# Patient Record
Sex: Female | Born: 1970 | Race: White | Hispanic: No | Marital: Married | Smoking: Former smoker
Health system: Southern US, Community
[De-identification: ages and names within clinical notes are randomized; demographics above are authoritative.]

## PROBLEM LIST (undated history)

## (undated) DIAGNOSIS — I10 Essential (primary) hypertension: Secondary | ICD-10-CM

## (undated) DIAGNOSIS — D649 Anemia, unspecified: Secondary | ICD-10-CM

## (undated) DIAGNOSIS — K759 Inflammatory liver disease, unspecified: Secondary | ICD-10-CM

## (undated) DIAGNOSIS — D509 Iron deficiency anemia, unspecified: Secondary | ICD-10-CM

## (undated) DIAGNOSIS — B019 Varicella without complication: Secondary | ICD-10-CM

## (undated) DIAGNOSIS — K219 Gastro-esophageal reflux disease without esophagitis: Secondary | ICD-10-CM

## (undated) DIAGNOSIS — F329 Major depressive disorder, single episode, unspecified: Secondary | ICD-10-CM

## (undated) DIAGNOSIS — G2581 Restless legs syndrome: Secondary | ICD-10-CM

## (undated) DIAGNOSIS — F32A Depression, unspecified: Secondary | ICD-10-CM

## (undated) HISTORY — DX: Iron deficiency anemia, unspecified: D50.9

## (undated) HISTORY — DX: Anemia, unspecified: D64.9

## (undated) HISTORY — DX: Major depressive disorder, single episode, unspecified: F32.9

## (undated) HISTORY — DX: Depression, unspecified: F32.A

## (undated) HISTORY — DX: Varicella without complication: B01.9

## (undated) HISTORY — PX: HERNIA REPAIR: SHX51

## (undated) HISTORY — DX: Essential (primary) hypertension: I10

## (undated) HISTORY — DX: Restless legs syndrome: G25.81

## (undated) HISTORY — DX: Inflammatory liver disease, unspecified: K75.9

---

## 2001-04-21 HISTORY — PX: CHOLECYSTECTOMY: SHX55

## 2018-04-29 ENCOUNTER — Telehealth: Payer: Self-pay

## 2018-04-29 NOTE — Telephone Encounter (Signed)
Copied from [place] [id number]. Topic: Appointment Scheduling - Scheduling Inquiry for Clinic >> Apr 29, 2018  1:36 PM Conception Chancy, NT wrote: Reason for CRM: patient is calling and states that her fiance Denice Paradise sees Dr. [name] and discussed with him about taking on this patient as a new patient. Please advise.

## 2018-04-30 NOTE — Telephone Encounter (Signed)
msg sent to Dr. [name]

## 2018-05-14 NOTE — Telephone Encounter (Signed)
Pts husband checking status on below.

## 2018-05-14 NOTE — Telephone Encounter (Signed)
Called patient to let him know that I apologize for delay and would call her on Monday to schedule appt.

## 2018-05-17 NOTE — Telephone Encounter (Signed)
Called patient to schedule new pt appointment, she stated she did not want to wait until April or May to be seen. She was agreeable to be seen. Placed pt on hold to find new pt appt slot and pt hung up.

## 2018-05-25 ENCOUNTER — Encounter: Payer: Self-pay | Admitting: Family Medicine

## 2018-05-25 ENCOUNTER — Ambulatory Visit (INDEPENDENT_AMBULATORY_CARE_PROVIDER_SITE_OTHER): Payer: PRIVATE HEALTH INSURANCE | Admitting: Family Medicine

## 2018-05-25 VITALS — BP 118/82 | HR 93 | Temp 98.1°F | Resp 18 | Ht 64.5 in | Wt 198.8 lb

## 2018-05-25 DIAGNOSIS — G2581 Restless legs syndrome: Secondary | ICD-10-CM

## 2018-05-25 DIAGNOSIS — Z23 Encounter for immunization: Secondary | ICD-10-CM | POA: Diagnosis not present

## 2018-05-25 DIAGNOSIS — Z13220 Encounter for screening for lipoid disorders: Secondary | ICD-10-CM

## 2018-05-25 DIAGNOSIS — G8929 Other chronic pain: Secondary | ICD-10-CM

## 2018-05-25 DIAGNOSIS — R5383 Other fatigue: Secondary | ICD-10-CM | POA: Diagnosis not present

## 2018-05-25 DIAGNOSIS — D649 Anemia, unspecified: Secondary | ICD-10-CM

## 2018-05-25 DIAGNOSIS — M545 Low back pain: Secondary | ICD-10-CM

## 2018-05-25 LAB — B12 AND FOLATE PANEL
Folate: 5.5 ng/mL — ABNORMAL LOW (ref >5.9)
Vitamin B-12: 514 pg/mL (ref 211–911)

## 2018-05-25 LAB — COMPREHENSIVE METABOLIC PANEL
ALT: 10 U/L (ref 0–35)
AST: 11 U/L (ref 0–37)
Albumin: 4.4 g/dL (ref 3.5–5.2)
Alkaline Phosphatase: 43 U/L (ref 39–117)
BUN: 11 mg/dL (ref 6–23)
CO2: 28 mEq/L (ref 19–32)
Calcium: 8.9 mg/dL (ref 8.4–10.5)
Chloride: 103 mEq/L (ref 96–112)
Creatinine, Ser: 0.47 mg/dL (ref 0.40–1.20)
GFR: 141.48 mL/min (ref >60.00)
Glucose, Bld: 96 mg/dL (ref 70–99)
Potassium: 4.1 mEq/L (ref 3.5–5.1)
Sodium: 138 mEq/L (ref 135–145)
Total Bilirubin: 0.3 mg/dL (ref 0.2–1.2)
Total Protein: 6.9 g/dL (ref 6.0–8.3)

## 2018-05-25 LAB — CBC
HCT: 31.1 % — ABNORMAL LOW (ref 36.0–46.0)
Hemoglobin: 9.2 g/dL — ABNORMAL LOW (ref 12.0–15.0)
MCHC: 29.1 g/dL — ABNORMAL LOW (ref 30.0–36.0)
MCV: 76.3 fl — AB (ref 78.0–100.0)
Platelets: 284 10*3/uL (ref 150.0–400.0)
RBC: 4.08 Mil/uL (ref 3.87–5.11)
RDW: 18.2 % — ABNORMAL HIGH (ref 11.5–15.5)
WBC: 4.6 10*3/uL (ref 4.0–10.5)

## 2018-05-25 LAB — LIPID PANEL
CHOLESTEROL: 177 mg/dL (ref 0–200)
HDL: 50.9 mg/dL (ref >39.00)
LDL CALC: 89 mg/dL (ref 0–99)
NonHDL: 125.95
Total CHOL/HDL Ratio: 3
Triglycerides: 186 mg/dL — ABNORMAL HIGH (ref 0.0–149.0)
VLDL: 37.2 mg/dL (ref 0.0–40.0)

## 2018-05-25 LAB — VITAMIN D 25 HYDROXY (VIT D DEFICIENCY, FRACTURES): VITD: 27.66 ng/mL — ABNORMAL LOW (ref 30.00–100.00)

## 2018-05-25 MED ORDER — PRAMIPEXOLE DIHYDROCHLORIDE 0.125 MG PO TABS: ORAL_TABLET | ORAL | 1 refills | Status: DC

## 2018-05-25 NOTE — Patient Instructions (Addendum)
Take aleve 220 mg in AM and PM for back pain  Can use tylenol in between during day if needed  No Ibuprofen or Advil with aleve -- too much of similar drug  Max tylenol in 24 hours in 4000 mg (8 tablets of the 500mg  extra strength tylenol)   General Headache Without Cause A headache is pain or discomfort that is felt around the head or neck area. There are many causes and types of headaches. In some cases, the cause may not be found. Follow these instructions at home: Watch your condition for any changes. Let your doctor know about them. Take these steps to help with your condition: Managing pain      Take over-the-counter and prescription medicines only as told by your doctor.  Lie down in a dark, quiet room when you have a headache.  If told, put ice on your head and neck area: ? Put ice in a plastic bag. ? Place a towel between your skin and the bag. ? Leave the ice on for 20 minutes, 2-3 times per day.  If told, put heat on the affected area. Use the heat source that your doctor recommends, such as a moist heat pack or a heating pad. ? Place a towel between your skin and the heat source. ? Leave the heat on for 20-30 minutes. ? Remove the heat if your skin turns bright red. This is very important if you are unable to feel pain, heat, or cold. You may have a greater risk of getting burned.  Keep lights dim if bright lights bother you or make your headaches worse. Eating and drinking  Eat meals on a regular schedule.  If you drink alcohol: ? Limit how much you use to:  0-1 drink a day for women.  0-2 drinks a day for men. ? Be aware of how much alcohol is in your drink. In the [place] one drink equals one 12 oz bottle of beer (355 mL), one 5 oz glass of wine (148 mL), or one 1 oz glass of hard liquor (44 mL).  Stop drinking caffeine, or reduce how much caffeine you drink. General instructions   Keep a journal to find out if certain things bring on headaches. For  example, write down: ? What you eat and drink. ? How much sleep you get. ? Any change to your diet or medicines.  Get a massage or try other ways to relax.  Limit stress.  Sit up straight. Do not tighten (tense) your muscles.  Do not use any products that contain nicotine or tobacco. This includes cigarettes, e-cigarettes, and chewing tobacco. If you need help quitting, ask your doctor.  Exercise regularly as told by your doctor.  Get enough sleep. This often means 7-9 hours of sleep each night.  Keep all follow-up visits as told by your doctor. This is important. Contact a doctor if:  Your symptoms are not helped by medicine.  You have a headache that feels different than the other headaches.  You feel sick to your stomach (nauseous) or you throw up (vomit).  You have a fever. Get help right away if:  Your headache gets very bad quickly.  Your headache gets worse after a lot of physical activity.  You keep throwing up.  You have a stiff neck.  You have trouble seeing.  You have trouble speaking.  You have pain in the eye or ear.  Your muscles are weak or you lose muscle control.  You lose  your balance or have trouble walking.  You feel like you will pass out (faint) or you pass out.  You are mixed up (confused).  You have a seizure. Summary  A headache is pain or discomfort that is felt around the head or neck area.  There are many causes and types of headaches. In some cases, the cause may not be found.  Keep a journal to help find out what causes your headaches. Watch your condition for any changes. Let your doctor know about them.  Contact a doctor if you have a headache that is different from usual, or if your headache is not helped by medicine.  Get help right away if your headache gets very bad, you throw up, you have trouble seeing, you lose your balance, or you have a seizure. This information is not intended to replace advice given to you by  your health care provider. Make sure you discuss any questions you have with your health care provider. Document Released: 01/15/2008 Document Revised: 10/26/2017 Document Reviewed: 10/26/2017 Elsevier Interactive Patient Education  2019 [place].  Back Exercises If you have pain in your back, do these exercises 2-3 times each day or as told by your doctor. When the pain goes away, do the exercises once each day, but repeat the steps more times for each exercise (do more repetitions). If you do not have pain in your back, do these exercises once each day or as told by your doctor. Exercises Single Knee to Chest Do these steps 3-5 times in a row for each leg: 1. Lie on your back on a firm bed or the floor with your legs stretched out. 2. Bring one knee to your chest. 3. Hold your knee to your chest by grabbing your knee or thigh. 4. Pull on your knee until you feel a gentle stretch in your lower back. 5. Keep doing the stretch for 10-30 seconds. 6. Slowly let go of your leg and straighten it. Pelvic Tilt Do these steps 5-10 times in a row: 1. Lie on your back on a firm bed or the floor with your legs stretched out. 2. Bend your knees so they point up to the ceiling. Your feet should be flat on the floor. 3. Tighten your lower belly (abdomen) muscles to press your lower back against the floor. This will make your tailbone point up to the ceiling instead of pointing down to your feet or the floor. 4. Stay in this position for 5-10 seconds while you gently tighten your muscles and breathe evenly. Cat-Cow Do these steps until your lower back bends more easily: 1. Get on your hands and knees on a firm surface. Keep your hands under your shoulders, and keep your knees under your hips. You may put padding under your knees. 2. Let your head hang down, and make your tailbone point down to the floor so your lower back is round like the back of a cat. 3. Stay in this position for 5  seconds. 4. Slowly lift your head and make your tailbone point up to the ceiling so your back hangs low (sags) like the back of a cow. 5. Stay in this position for 5 seconds.  Press-Ups Do these steps 5-10 times in a row: 1. Lie on your belly (face-down) on the floor. 2. Place your hands near your head, about shoulder-width apart. 3. While you keep your back relaxed and keep your hips on the floor, slowly straighten your arms to raise the top  half of your body and lift your shoulders. Do not use your back muscles. To make yourself more comfortable, you may change where you place your hands. 4. Stay in this position for 5 seconds. 5. Slowly return to lying flat on the floor.  Bridges Do these steps 10 times in a row: 1. Lie on your back on a firm surface. 2. Bend your knees so they point up to the ceiling. Your feet should be flat on the floor. 3. Tighten your butt muscles and lift your butt off of the floor until your waist is almost as high as your knees. If you do not feel the muscles working in your butt and the back of your thighs, slide your feet 1-2 inches farther away from your butt. 4. Stay in this position for 3-5 seconds. 5. Slowly lower your butt to the floor, and let your butt muscles relax. If this exercise is too easy, try doing it with your arms crossed over your chest. Belly Crunches Do these steps 5-10 times in a row: 1. Lie on your back on a firm bed or the floor with your legs stretched out. 2. Bend your knees so they point up to the ceiling. Your feet should be flat on the floor. 3. Cross your arms over your chest. 4. Tip your chin a little bit toward your chest but do not bend your neck. 5. Tighten your belly muscles and slowly raise your chest just enough to lift your shoulder blades a tiny bit off of the floor. 6. Slowly lower your chest and your head to the floor. Back Lifts Do these steps 5-10 times in a row: 1. Lie on your belly (face-down) with your arms at  your sides, and rest your forehead on the floor. 2. Tighten the muscles in your legs and your butt. 3. Slowly lift your chest off of the floor while you keep your hips on the floor. Keep the back of your head in line with the curve in your back. Look at the floor while you do this. 4. Stay in this position for 3-5 seconds. 5. Slowly lower your chest and your face to the floor. Contact a doctor if:  Your back pain gets a lot worse when you do an exercise.  Your back pain does not lessen 2 hours after you exercise. If you have any of these problems, stop doing the exercises. Do not do them again unless your doctor says it is okay. Get help right away if:  You have sudden, very bad back pain. If this happens, stop doing the exercises. Do not do them again unless your doctor says it is okay. This information is not intended to replace advice given to you by your health care provider. Make sure you discuss any questions you have with your health care provider. Document Released: 05/10/2010 Document Revised: 12/30/2017 Document Reviewed: 06/01/2014 Elsevier Interactive Patient Education  [employer].

## 2018-05-25 NOTE — Progress Notes (Signed)
Subjective:    Patient ID: Brianna [name], female    DOB: 23-Dec-1970, 48 y.o.   MRN: [id number]  HPI   Patient presents to clinic to establish with PCP.  Patient has not seen medical provider in many years, and wants to get reestablished.  Patient's main concern today is restless leg syndrome.  Patient states she has struggled with restless legs for many years.  States that she tried Requip probably over 10 years ago, this is not effective.  States she will jerk and kick in her sleep, and this bothers her significant other.  Patient also complains of some fatigue,, generalized.  States she knows she does not sleep well due to working long hours at her [employer].  States her sleep is often broken because she is more work 16 to 18-hour days, get small amount of sleep and then go back for another long day when having a big order that comes in.  Patient also states she has low back pain almost every day.  Usually the pain will improve with taking Tylenol.  Patient states she has taken up to 12 extra strength Tylenol tablets in 1 day before.  Patient needs a mammogram and Pap smear, but plans to make appointment with a GYN for these things.  Patient Active Problem List   Diagnosis Date Noted  . Restless leg 05/26/2018   Past Medical History:  Diagnosis Date  . Chicken pox   . Depression   . Jaundice due to hepatitis    Social History   Tobacco Use  . Smoking status: Former Research scientist (life sciences)  . Smokeless tobacco: Never Used  Substance Use Topics  . Alcohol use: Yes   Family History  Problem Relation Age of Onset  . Early death Mother   . Heart disease Mother   . Hyperlipidemia Mother   . Hypertension Mother   . Alcohol abuse Father   . Arthritis Father   . COPD Father   . Hyperlipidemia Father   . Hypertension Father   . Kidney disease Father   . Arthritis Maternal Grandmother   . Stroke Maternal Grandmother   . Cancer Maternal Grandfather   . Hyperlipidemia Maternal  Grandfather    Past Surgical History:  Procedure Laterality Date  . CESAREAN SECTION  [phone]  . CHOLECYSTECTOMY  2003    Review of Systems  Constitutional: Negative for chills, and fever. +fatigue HENT: Negative for congestion, ear pain, sinus pain and sore throat.   Eyes: Negative.   Respiratory: Negative for cough, shortness of breath and wheezing.   Cardiovascular: Negative for chest pain, palpitations and leg swelling.  Gastrointestinal: Negative for abdominal pain, diarrhea, nausea and vomiting.  Genitourinary: Negative for dysuria, frequency and urgency.  Musculoskeletal: +low back pain Skin: Negative for color change, pallor and rash.  Neurological: Negative for syncope, light-headedness and headaches. + restless legs while sleeping, x many years Psychiatric/Behavioral: The patient is not nervous/anxious.    Objective:   Physical Exam   Constitutional: She appears well-developed and well-nourished. No distress.  HENT:  Head: Normocephalic and atraumatic.  Eyes: Pupils are equal, round, and reactive to light. EOM are normal. No scleral icterus.  Neck: Normal range of motion. Neck supple. No tracheal deviation present.  Cardiovascular: Normal rate, regular rhythm and normal heart sounds.  Pulmonary/Chest: Effort normal and breath sounds normal. No respiratory distress. She has no wheezes. She has no rales.  Abdominal: Soft. Bowel sounds are normal. There is no tenderness.  Musculoskeletal: Range of motion  of lumbar spine intact.  Patient can bend forward backward, bend side to side, twist side to side without issue. Neurological: She is alert and oriented to person, place, and time. Gait normal.  Grips equal and strong.  Quadricep strength equal and strong.  Dorsi plantar flexion equal and strong.  DTRs normal.  Speech clear, smile symmetrical, can raise eyebrows and puff out cheeks. Skin: Skin is warm and dry. No pallor.  Psychiatric: She has a normal mood and  affect. Her behavior is normal. Thought content normal.   Nursing note and vitals reviewed.   Vitals:   05/25/18 1032  BP: 118/82  Pulse: 93  Resp: 18  Temp: 98.1 F (36.7 C)  SpO2: 98%      Assessment & Plan:   Restless leg syndrome - patient will trial Mirapex to see if this helps with restless leg syndrome.  We will also get lab work to look for any vitamin/electrolyte abnormality.  Fatigue - lab work will further assess for any thyroid issue, electrolyte and balances, vitamin deficiencies or anemias.  Suspect patient's fatigue could be related to her scattered sleep schedule  Chronic bilateral low back pain - patient given handout of exercises to do to help stretching and improve back pain.  Advised on maximums for Tylenol and ibuprofen in 1 day.  Suggest that she try Aleve to 20 mg 1-2 times per day and Tylenol 1-2 times per day as needed if having back pain or other joint pain.  Flu vaccine given in clinic.  Patient will call GYN to get her annual women's health exam including Pap smear and mammogram.  Patient will follow-up here in approximately 4 weeks for recheck on restless leg syndrome after starting Mirapex.

## 2018-05-26 DIAGNOSIS — G2581 Restless legs syndrome: Secondary | ICD-10-CM | POA: Insufficient documentation

## 2018-05-26 LAB — THYROID PANEL WITH TSH
Free Thyroxine Index: 1.6 (ref 1.4–3.8)
T3 Uptake: 33 % (ref 22–35)
T4, Total: 4.9 ug/dL — ABNORMAL LOW (ref 5.1–11.9)
TSH: 2.46 m[IU]/L

## 2018-05-31 ENCOUNTER — Other Ambulatory Visit: Payer: Self-pay | Admitting: Family Medicine

## 2018-05-31 DIAGNOSIS — Z1231 Encounter for screening mammogram for malignant neoplasm of breast: Secondary | ICD-10-CM

## 2018-05-31 NOTE — Addendum Note (Signed)
Addended by: Philis Nettle on: 05/31/2018 12:02 PM   Modules accepted: Orders

## 2018-06-10 ENCOUNTER — Other Ambulatory Visit (INDEPENDENT_AMBULATORY_CARE_PROVIDER_SITE_OTHER): Payer: PRIVATE HEALTH INSURANCE

## 2018-06-10 DIAGNOSIS — D649 Anemia, unspecified: Secondary | ICD-10-CM | POA: Diagnosis not present

## 2018-06-10 DIAGNOSIS — D509 Iron deficiency anemia, unspecified: Secondary | ICD-10-CM

## 2018-06-10 LAB — CBC
HCT: 30.3 % — ABNORMAL LOW (ref 36.0–46.0)
Hemoglobin: 9.1 g/dL — ABNORMAL LOW (ref 12.0–15.0)
MCHC: 29.9 g/dL — AB (ref 30.0–36.0)
MCV: 77.7 fl — ABNORMAL LOW (ref 78.0–100.0)
Platelets: 343 10*3/uL (ref 150.0–400.0)
RBC: 3.9 Mil/uL (ref 3.87–5.11)
RDW: 18.6 % — ABNORMAL HIGH (ref 11.5–15.5)
WBC: 6.5 10*3/uL (ref 4.0–10.5)

## 2018-06-11 LAB — IRON,TIBC AND FERRITIN PANEL
%SAT: 3 % (calc) — ABNORMAL LOW (ref 16–45)
Ferritin: 5 ng/mL — ABNORMAL LOW (ref 16–232)
Iron: 12 ug/dL — ABNORMAL LOW (ref 40–190)
TIBC: 411 ug/dL (ref 250–450)

## 2018-06-11 MED ORDER — FERROUS SULFATE 325 (65 FE) MG PO TBEC: 325.0000 mg | DELAYED_RELEASE_TABLET | Freq: Two times a day (BID) | ORAL | 3 refills | Status: DC

## 2018-06-11 NOTE — Addendum Note (Signed)
Addended by: Philis Nettle on: 06/11/2018 12:38 PM   Modules accepted: Orders

## 2018-06-16 ENCOUNTER — Ambulatory Visit: Payer: PRIVATE HEALTH INSURANCE

## 2018-06-16 ENCOUNTER — Other Ambulatory Visit (HOSPITAL_COMMUNITY)
Admission: RE | Admit: 2018-06-16 | Discharge: 2018-06-16 | Disposition: A | Discharge status: Home / Self Care | Payer: PRIVATE HEALTH INSURANCE | Source: Ambulatory Visit | Attending: Obstetrics and Gynecology | Admitting: Obstetrics and Gynecology

## 2018-06-16 ENCOUNTER — Ambulatory Visit (INDEPENDENT_AMBULATORY_CARE_PROVIDER_SITE_OTHER): Payer: PRIVATE HEALTH INSURANCE | Admitting: Obstetrics and Gynecology

## 2018-06-16 ENCOUNTER — Encounter: Payer: Self-pay | Admitting: Obstetrics and Gynecology

## 2018-06-16 VITALS — BP 150/90 | HR 90 | Ht 64.0 in | Wt 200.0 lb

## 2018-06-16 DIAGNOSIS — N852 Hypertrophy of uterus: Secondary | ICD-10-CM

## 2018-06-16 DIAGNOSIS — N951 Menopausal and female climacteric states: Secondary | ICD-10-CM

## 2018-06-16 DIAGNOSIS — Z124 Encounter for screening for malignant neoplasm of cervix: Secondary | ICD-10-CM

## 2018-06-16 DIAGNOSIS — Z1239 Encounter for other screening for malignant neoplasm of breast: Secondary | ICD-10-CM

## 2018-06-16 DIAGNOSIS — N6323 Unspecified lump in the left breast, lower outer quadrant: Secondary | ICD-10-CM

## 2018-06-16 DIAGNOSIS — Z1231 Encounter for screening mammogram for malignant neoplasm of breast: Secondary | ICD-10-CM

## 2018-06-16 DIAGNOSIS — Z01419 Encounter for gynecological examination (general) (routine) without abnormal findings: Secondary | ICD-10-CM | POA: Diagnosis not present

## 2018-06-16 DIAGNOSIS — Z1211 Encounter for screening for malignant neoplasm of colon: Secondary | ICD-10-CM

## 2018-06-16 DIAGNOSIS — Z0000 Encounter for general adult medical examination without abnormal findings: Secondary | ICD-10-CM

## 2018-06-16 DIAGNOSIS — R232 Flushing: Secondary | ICD-10-CM

## 2018-06-16 DIAGNOSIS — N6324 Unspecified lump in the left breast, lower inner quadrant: Secondary | ICD-10-CM

## 2018-06-16 MED ORDER — PAROXETINE HCL 10 MG PO TABS: 10.0000 mg | ORAL_TABLET | Freq: Every day | ORAL | 11 refills | Status: DC

## 2018-06-16 NOTE — Progress Notes (Signed)
Gynecology Annual Exam  PCP: [name], FNP  Chief Complaint:  Chief Complaint  Patient presents with  . Gynecologic Exam    Some urinary incontinace with cough, laugh, and sneeze, and hot flashes     History of Present Illness: Patient is a 48 y.o. Brianna [name] [name] presents for annual exam. The patient has no complaints today.   Last period was in August of 2019. For two years prior to August she was having heavy and irregular periods and then the bleeding stopped. She has been having hot flashes, irritability and sleep disturbances.   The patient is sexually active. She currently uses none for contraception. She denies dyspareunia.  The patient does perform self breast exams.  There is notable family history of breast or ovarian cancer in her family.  The patient wears seatbelts: yes.   The patient has regular exercise: no.    The patient denies current symptoms of depression.    Review of Systems: Review of Systems  Constitutional: Positive for malaise/fatigue. Negative for chills, fever and weight loss.       + hot flashes + hot / cold intolerance  HENT: Negative for congestion, hearing loss and sinus pain.   Eyes: Negative for blurred vision and double vision.  Respiratory: Negative for cough, sputum production, shortness of breath and wheezing.   Cardiovascular: Negative for chest pain, palpitations, orthopnea and leg swelling.  Gastrointestinal: Negative for abdominal pain, constipation, diarrhea, nausea and vomiting.  Genitourinary: Positive for frequency. Negative for dysuria, flank pain, hematuria and urgency.       + difficulty holding urine  Musculoskeletal: Negative for back pain, falls and joint pain.       + joint swelling  Skin: Negative for itching and rash.  Neurological: Positive for headaches. Negative for dizziness.  Psychiatric/Behavioral: Negative for depression, substance abuse and suicidal ideas. The patient is nervous/anxious.     Past Medical  History:  Past Medical History:  Diagnosis Date  . Anemia   . Chicken pox   . Depression   . Jaundice due to hepatitis   . Restless leg syndrome     Past Surgical History:  Past Surgical History:  Procedure Laterality Date  . CESAREAN SECTION  [phone]  . CHOLECYSTECTOMY  2003    Gynecologic History:  Patient's last menstrual period was 08/Brianna [name]/2019 (approximate). Contraception: none Last Pap: Results were: Patient has not had a pap or a pelvic exam in 15 years.  Last mammogram: Never  Obstetric History: [id number]  Family History:  Family History  Problem Relation Age of Onset  . Early death Mother   . Heart disease Mother   . Hyperlipidemia Mother   . Hypertension Mother   . Alcohol abuse Father   . Arthritis Father   . COPD Father   . Hyperlipidemia Father   . Hypertension Father   . Kidney disease Father   . Arthritis Maternal Grandmother   . Stroke Maternal Grandmother   . Hyperlipidemia Maternal Grandfather   . Breast cancer Paternal Aunt 23    Social History:  Social History   Socioeconomic History  . Marital status: Significant Other    Spouse name: Not on file  . Number of children: Not on file  . Years of education: Not on file  . Highest education level: Not on file  Occupational History  . Not on file  Social Needs  . Financial resource strain: Not on file  . Food insecurity:    Worry: Not on file  Inability: Not on file  . Transportation needs:    Medical: Not on file    Non-medical: Not on file  Tobacco Use  . Smoking status: Former Research scientist (life sciences)  . Smokeless tobacco: Never Used  Substance and Sexual Activity  . Alcohol use: Yes    Comment: occasional   . Drug use: Never  . Sexual activity: Yes    Birth control/protection: Post-menopausal  Lifestyle  . Physical activity:    Days per week: Not on file    Minutes per session: Not on file  . Stress: Not on file  Relationships  . Social connections:    Talks on phone: Not on file     Gets together: Not on file    Attends religious service: Not on file    Active member of club or organization: Not on file    Attends meetings of clubs or organizations: Not on file    Relationship status: Not on file  . Intimate partner violence:    Fear of current or ex partner: Not on file    Emotionally abused: Not on file    Physically abused: Not on file    Forced sexual activity: Not on file  Other Topics Concern  . Not on file  Social History Narrative  . Not on file    Allergies:  Allergies  Allergen Reactions  . Vicodin [Hydrocodone-Acetaminophen] Hives    Medications: Prior to Admission medications   Medication Sig Start Date End Date Taking? Authorizing Provider  Melatonin 10 MG TABS Take by mouth.   Yes [provider]  pramipexole (MIRAPEX) 0.125 MG tablet Take 1 tablet every evening before bed. May increase to 2 tablets every evening after 1 week if one tablet not effective. 05/25/18  Yes Guse, Jacquelynn Cree, FNP  ferrous sulfate 325 (65 FE) MG EC tablet Take 1 tablet (325 mg total) by mouth 2 (two) times daily with a meal. Patient not taking: Reported on 2/Brianna [name]/2020 06/11/18   [name], FNP  PARoxetine (PAXIL) 10 MG tablet Take 1 tablet (10 mg total) by mouth daily. 2/Brianna [name]/20   Homero Fellers, MD    Physical Exam Vitals: Blood pressure (!) 150/90, pulse 90, height 5\' 4"  (1.626 m), weight 200 lb (90.7 kg), last menstrual period 08/Brianna [name]/2019.  General: NAD HEENT: normocephalic, anicteric Thyroid: no enlargement, no palpable nodules Pulmonary: No increased work of breathing, CTAB Cardiovascular: RRR, distal pulses 2+ Breast: Breast symmetrical, no tenderness, Two lumps palpated in the left breast at 5 and 7 o'clock.  No skin or nipple retraction present, no nipple discharge.  No axillary or supraclavicular lymphadenopathy.  Abdomen: NABS, soft, non-tender, non-distended.  Umbilicus without lesions.  No hepatomegaly, splenomegaly or masses palpable. No  evidence of hernia  Genitourinary:  External: Normal external female genitalia.  Normal urethral meatus, normal Bartholin's and Skene's glands.    Vagina: Normal vaginal mucosa, no evidence of prolapse.    Cervix: Grossly normal in appearance, no bleeding  Uterus: Enlarged and bulky about 13cm in size, mobile, normal contour.  No CMT  Adnexa: ovaries non-enlarged, no adnexal masses  Rectal: deferred  Lymphatic: no evidence of inguinal lymphadenopathy Extremities: no edema, erythema, or tenderness Neurologic: Grossly intact Psychiatric: mood appropriate, affect full  Female chaperone present for pelvic and breast  portions of the physical exam    Assessment: 48 y.o. [id number] routine annual exam  Plan: Problem List Items Addressed This Visit    None    Visit Diagnoses    Pap smear  for cervical cancer screening    -  Primary   Relevant Orders   Cytology - PAP   Healthcare maintenance       Screening breast examination       Breast cancer screening by mammogram       Relevant Orders   MM DIAG BREAST TOMO BILATERAL   Visit for pelvic exam       Breast lump on left side at 5 o'clock position       Relevant Orders   MM Digital Diagnostic Bilat   US BREAST LTD UNI RIGHT INC AXILLA   US BREAST LTD UNI LEFT INC AXILLA   MM DIAG BREAST TOMO BILATERAL   Breast lump on left side at 7 o'clock position       Relevant Orders   MM Digital Diagnostic Bilat   US BREAST LTD UNI RIGHT INC AXILLA   US BREAST LTD UNI LEFT INC AXILLA   MM DIAG BREAST TOMO BILATERAL   Vasomotor flushing       Relevant Medications   PARoxetine (PAXIL) 10 MG tablet   Perimenopausal       Relevant Medications   PARoxetine (PAXIL) 10 MG tablet   Bulky or enlarged uterus       Relevant Orders   US PELVIS TRANSVANGINAL NON-OB (TV ONLY)   Colon cancer screening       Relevant Orders   Ambulatory referral to Gastroenterology      1) Mammogram - recommend yearly screening mammograms.   Breast lump felt in  left breast at 5 and 7 o'clock - Diagnostic mammogram and breast US ordered today. Scheduled for 06/18/17 at 1:30pm.   2) STI screening  was offered and declined  3) ASCCP guidelines and rational discussed.  Patient opts for every 3 years screening interval. Pap collected today  4) Contraception - the patient is currently using  none.  She is happy with her current form of contraception and plans to continue.  5) Colonoscopy -- Screening recommended starting at age 32 for average risk individuals, age 28 for individuals deemed at increased risk (including African Americans) and recommended to continue until age 93.  For patient age 31-85 individualized approach is recommended.  Gold standard screening is via colonoscopy, Cologuard screening is an acceptable alternative for patient unwilling or unable to undergo colonoscopy.  "Colorectal cancer screening for average?risk adults: 2018 guideline update from the American Cancer Society"CA: A Cancer Journal for Clinicians: Sep 17, 2016  Referral made to GI for colonoscopy.   6) Routine healthcare maintenance including cholesterol, diabetes screening discussed managed by PCP   7) Elevated BP- will monitor, may need initiation of medication if is persistently elevated  8) Stress incontinence- discussed options for conservative management with tampons, pelvic floor PT and pessary versus surgical management. She is interested in surgical management. No incontinence demonstrated on today's examination, but she had recently emptied her bladder. She will return for further work up and evaluation with a full bladder.   9) Enlarged bulky uterus, no hx of fibroids, will obtain pelvic US.  10) Genital Warts present. She would like these surgically removed, will plan on this at a future visit.   11) Vasomotor symptoms- would like to start paxil for hot flashes. Rx sent.  12)  Return in about 1 week (around 06/23/2018) for GYN visit and [place] (first visit in AM or  PM).   Adrian Prows MD [facility] 2/Brianna [name]/2020 5:53 PM

## 2018-06-16 NOTE — Patient Instructions (Signed)

## 2018-06-17 ENCOUNTER — Inpatient Hospital Stay: Payer: PRIVATE HEALTH INSURANCE | Attending: Oncology | Admitting: Oncology

## 2018-06-17 ENCOUNTER — Encounter: Payer: Self-pay | Admitting: Oncology

## 2018-06-17 ENCOUNTER — Other Ambulatory Visit: Payer: Self-pay

## 2018-06-17 ENCOUNTER — Other Ambulatory Visit: Payer: Self-pay | Admitting: Family Medicine

## 2018-06-17 VITALS — BP 169/108 | HR 70 | Temp 97.6°F | Ht 64.5 in | Wt 200.5 lb

## 2018-06-17 DIAGNOSIS — D509 Iron deficiency anemia, unspecified: Secondary | ICD-10-CM | POA: Insufficient documentation

## 2018-06-17 DIAGNOSIS — Z79899 Other long term (current) drug therapy: Secondary | ICD-10-CM | POA: Diagnosis not present

## 2018-06-17 DIAGNOSIS — Z87891 Personal history of nicotine dependence: Secondary | ICD-10-CM

## 2018-06-17 DIAGNOSIS — Z803 Family history of malignant neoplasm of breast: Secondary | ICD-10-CM | POA: Diagnosis not present

## 2018-06-17 DIAGNOSIS — G2581 Restless legs syndrome: Secondary | ICD-10-CM

## 2018-06-17 HISTORY — DX: Iron deficiency anemia, unspecified: D50.9

## 2018-06-17 NOTE — Progress Notes (Signed)
Patient is here today to establish care for her iron deficiency anemia. Patient stated that she feels tired and fatigued. Patient denied fever, chills, nausea, vomiting, constipation or diarrhea. Patient is set up to have a colonoscopy soon. They will call her with the appointment.

## 2018-06-17 NOTE — Progress Notes (Signed)
[place]  Telephone:(336) [phone] [phone]) [phone]  ID: Brianna [name] OB: Nov 03, 1970  MR#: [id number]  NKN#:[id number]  Patient Care Team: [name], FNP as PCP - General (Family Medicine)  CHIEF COMPLAINT: Iron deficiency anemia.  INTERVAL HISTORY: Patient is a 48 year old female who was noted to have a significantly decreased hemoglobin and iron stores on routine blood work.  She has increased weakness and fatigue, but otherwise feels well.  She has no neurologic complaints.  She denies any recent fevers or illnesses.  She has a good appetite and denies weight loss.  She has no chest pain or shortness of breath.  She denies any nausea, vomiting, constipation, or diarrhea.  She denies any melena or hematochezia.  She has no urinary complaints.  Patient otherwise feels well and offers no further specific complaints today.  REVIEW OF SYSTEMS:   Review of Systems  Constitutional: Positive for malaise/fatigue. Negative for fever and weight loss.  Respiratory: Negative.  Negative for cough, hemoptysis and shortness of breath.   Cardiovascular: Negative.  Negative for chest pain and leg swelling.  Gastrointestinal: Negative.  Negative for abdominal pain, blood in stool and melena.  Genitourinary: Negative.  Negative for hematuria.  Musculoskeletal: Negative.  Negative for back pain.  Skin: Negative.  Negative for rash.  Neurological: Negative.  Negative for focal weakness, weakness and headaches.  Psychiatric/Behavioral: Negative.  The patient is not nervous/anxious.     As per HPI. Otherwise, a complete review of systems is negative.  PAST MEDICAL HISTORY: Past Medical History:  Diagnosis Date  . Anemia   . Chicken pox   . Depression   . Iron deficiency anemia 06/17/2018  . Jaundice due to hepatitis   . Restless leg syndrome     PAST SURGICAL HISTORY: Past Surgical History:  Procedure Laterality Date  . CESAREAN SECTION  [phone]  .  CHOLECYSTECTOMY  2003    FAMILY HISTORY: Family History  Problem Relation Age of Onset  . Early death Mother   . Heart disease Mother   . Hyperlipidemia Mother   . Hypertension Mother   . Alcohol abuse Father   . Arthritis Father   . COPD Father   . Hyperlipidemia Father   . Hypertension Father   . Kidney disease Father   . Arthritis Maternal Grandmother   . Stroke Maternal Grandmother   . Hyperlipidemia Maternal Grandfather   . Breast cancer Paternal Aunt 46    ADVANCED DIRECTIVES (Y/N):  N  HEALTH MAINTENANCE: Social History   Tobacco Use  . Smoking status: Former Research scientist (life sciences)  . Smokeless tobacco: Never Used  Substance Use Topics  . Alcohol use: Yes    Comment: occasional   . Drug use: Never     Colonoscopy:  PAP:  Bone density:  Lipid panel:  Allergies  Allergen Reactions  . Vicodin [Hydrocodone-Acetaminophen] Hives    Current Outpatient Medications  Medication Sig Dispense Refill  . acetaminophen (TYLENOL) 500 MG tablet Take 500 mg by mouth every 6 (six) hours as needed.    . ferrous sulfate 325 (65 FE) MG EC tablet Take 1 tablet (325 mg total) by mouth 2 (two) times daily with a meal. 60 tablet 3  . Melatonin 10 MG TABS Take by mouth.    Marland Kitchen PARoxetine (PAXIL) 10 MG tablet Take 1 tablet (10 mg total) by mouth daily. 30 tablet 11  . pramipexole (MIRAPEX) 0.125 MG tablet Take 1 tablet every evening before bed. May increase to 2 tablets every evening after 1  week if one tablet not effective. 60 tablet 1   No current facility-administered medications for this visit.     OBJECTIVE: Vitals:   06/17/18 1457  BP: (!) 169/108  Pulse: 70  Temp: 97.6 F (36.4 C)     Body mass index is 33.88 kg/m.    ECOG FS:0 - Asymptomatic  General: Well-developed, well-nourished, no acute distress. Eyes: Pink conjunctiva, anicteric sclera. HEENT: Normocephalic, moist mucous membranes, clear oropharnyx. Lungs: Clear to auscultation bilaterally. Heart: Regular rate and  rhythm. No rubs, murmurs, or gallops. Abdomen: Soft, nontender, nondistended. No organomegaly noted, normoactive bowel sounds. Musculoskeletal: No edema, cyanosis, or clubbing. Neuro: Alert, answering all questions appropriately. Cranial nerves grossly intact. Skin: No rashes or petechiae noted. Psych: Normal affect. Lymphatics: No cervical, calvicular, axillary or inguinal LAD.   LAB RESULTS:  Lab Results  Component Value Date   NA 138 05/25/2018   K 4.1 05/25/2018   CL 103 05/25/2018   CO2 28 05/25/2018   GLUCOSE 96 05/25/2018   BUN 11 05/25/2018   CREATININE 0.47 05/25/2018   CALCIUM 8.9 05/25/2018   PROT 6.9 05/25/2018   ALBUMIN 4.4 05/25/2018   AST 11 05/25/2018   ALT 10 05/25/2018   ALKPHOS 43 05/25/2018   BILITOT 0.3 05/25/2018    Lab Results  Component Value Date   WBC 6.5 06/10/2018   HGB 9.1 (L) 06/10/2018   HCT 30.3 (L) 06/10/2018   MCV 77.7 (L) 06/10/2018   PLT 343.0 06/10/2018   Lab Results  Component Value Date   IRON 12 (L) 06/10/2018   TIBC 411 06/10/2018   IRONPCTSAT 3 (L) 06/10/2018   Lab Results  Component Value Date   FERRITIN 5 (L) 06/10/2018     STUDIES: No results found.  ASSESSMENT: Iron deficiency anemia  PLAN:    1. Iron deficiency anemia: Likely from chronic blood loss secondary to previous history of reported heavy menses.  Patient's hemoglobin and iron stores are significantly reduced and she will benefit from IV Feraheme.  She has been instructed to continue taking her oral iron supplementation as prescribed.  If patient's hemoglobin does not improve with iron infusions, will consider a full anemia work-up at a later date.  Also agree with the recommendation of GI referral for consideration of colonoscopy.  Return to clinic in 1 and 2 weeks for 510 mg IV Feraheme.  Patient will then return to clinic in 3 months with repeat laboratory work and further evaluation.  I spent a total of 45 minutes face-to-face with the patient of  which greater than 50% of the visit was spent in counseling and coordination of care as detailed above.   Patient expressed understanding and was in agreement with this plan. She also understands that She can call clinic at any time with any questions, concerns, or complaints.    Lloyd Huger, MD   06/17/2018 3:21 PM

## 2018-06-18 ENCOUNTER — Ambulatory Visit
Admission: RE | Admit: 2018-06-18 | Discharge: 2018-06-18 | Disposition: A | Discharge status: Home / Self Care | Payer: PRIVATE HEALTH INSURANCE | Source: Ambulatory Visit | Attending: Obstetrics and Gynecology | Admitting: Obstetrics and Gynecology

## 2018-06-18 DIAGNOSIS — N6323 Unspecified lump in the left breast, lower outer quadrant: Secondary | ICD-10-CM | POA: Insufficient documentation

## 2018-06-18 DIAGNOSIS — N6324 Unspecified lump in the left breast, lower inner quadrant: Secondary | ICD-10-CM

## 2018-06-18 DIAGNOSIS — Z1231 Encounter for screening mammogram for malignant neoplasm of breast: Secondary | ICD-10-CM | POA: Diagnosis present

## 2018-06-22 ENCOUNTER — Ambulatory Visit: Payer: PRIVATE HEALTH INSURANCE | Admitting: Family Medicine

## 2018-06-22 ENCOUNTER — Inpatient Hospital Stay: Payer: PRIVATE HEALTH INSURANCE | Attending: Oncology

## 2018-06-22 VITALS — BP 119/70 | HR 69 | Resp 18

## 2018-06-22 DIAGNOSIS — Z79899 Other long term (current) drug therapy: Secondary | ICD-10-CM | POA: Insufficient documentation

## 2018-06-22 DIAGNOSIS — Z87891 Personal history of nicotine dependence: Secondary | ICD-10-CM | POA: Diagnosis not present

## 2018-06-22 DIAGNOSIS — D509 Iron deficiency anemia, unspecified: Secondary | ICD-10-CM | POA: Diagnosis present

## 2018-06-22 MED ORDER — SODIUM CHLORIDE 0.9 % IV SOLN
Freq: Once | INTRAVENOUS | Status: AC
  Administered 2018-06-22: 14:00:00 via INTRAVENOUS
  Filled 2018-06-22: qty 250

## 2018-06-22 MED ORDER — SODIUM CHLORIDE 0.9 % IV SOLN
510.0000 mg | Freq: Once | INTRAVENOUS | Status: AC
  Administered 2018-06-22: 510 mg via INTRAVENOUS
  Filled 2018-06-22: qty 17

## 2018-06-22 NOTE — Telephone Encounter (Signed)
Last OV 05/25/2018  Last refilled 05/25/2018 disp 60 with 1 refill should of had an Rx to pick up for this month

## 2018-06-24 ENCOUNTER — Telehealth: Payer: Self-pay

## 2018-06-24 ENCOUNTER — Other Ambulatory Visit: Payer: Self-pay

## 2018-06-24 DIAGNOSIS — Z1211 Encounter for screening for malignant neoplasm of colon: Secondary | ICD-10-CM

## 2018-06-24 NOTE — Telephone Encounter (Signed)
Gastroenterology Pre-Procedure Review  Request Date: 07/06/18 Requesting Physician: Dr. Vicente Males  PATIENT REVIEW QUESTIONS: The patient responded to the following health history questions as indicated:    1. Are you having any GI issues? no 2. Do you have a personal history of Polyps? no 3. Do you have a family history of Colon Cancer or Polyps? no 4. Diabetes Mellitus? no 5. Joint replacements in the past 12 months?no 6. Major health problems in the past 3 months?no 7. Any artificial heart valves, MVP, or defibrillator?no    MEDICATIONS & ALLERGIES:    Patient reports the following regarding taking any anticoagulation/antiplatelet therapy:   Plavix, Coumadin, Eliquis, Xarelto, Lovenox, Pradaxa, Brilinta, or Effient? no Aspirin? no  Patient confirms/reports the following medications:  Current Outpatient Medications  Medication Sig Dispense Refill  . acetaminophen (TYLENOL) 500 MG tablet Take 500 mg by mouth every 6 (six) hours as needed.    . ferrous sulfate 325 (65 FE) MG EC tablet Take 1 tablet (325 mg total) by mouth 2 (two) times daily with a meal. 60 tablet 3  . Melatonin 10 MG TABS Take by mouth.    Marland Kitchen PARoxetine (PAXIL) 10 MG tablet Take 1 tablet (10 mg total) by mouth daily. 30 tablet 11  . pramipexole (MIRAPEX) 0.125 MG tablet Take 1 tablet every evening before bed. May increase to 2 tablets every evening after 1 week if one tablet not effective. 60 tablet 1   No current facility-administered medications for this visit.     Patient confirms/reports the following allergies:  Allergies  Allergen Reactions  . Vicodin [Hydrocodone-Acetaminophen] Hives    No orders of the defined types were placed in this encounter.   AUTHORIZATION INFORMATION Primary Insurance: 1D#: Group #:  Secondary Insurance: 1D#: Group #:  SCHEDULE INFORMATION: Date: 07/06/18 Time: Location:[facility]

## 2018-06-28 LAB — CYTOLOGY - PAP
Diagnosis: NEGATIVE
HPV: NOT DETECTED

## 2018-06-28 NOTE — Progress Notes (Signed)
NIL, released to mychart

## 2018-06-29 ENCOUNTER — Ambulatory Visit (INDEPENDENT_AMBULATORY_CARE_PROVIDER_SITE_OTHER): Payer: PRIVATE HEALTH INSURANCE

## 2018-06-29 ENCOUNTER — Encounter: Payer: Self-pay | Admitting: Obstetrics and Gynecology

## 2018-06-29 ENCOUNTER — Ambulatory Visit (INDEPENDENT_AMBULATORY_CARE_PROVIDER_SITE_OTHER): Payer: PRIVATE HEALTH INSURANCE | Admitting: Obstetrics and Gynecology

## 2018-06-29 VITALS — BP 150/90 | HR 87 | Ht 65.0 in | Wt 200.0 lb

## 2018-06-29 DIAGNOSIS — F419 Anxiety disorder, unspecified: Secondary | ICD-10-CM

## 2018-06-29 DIAGNOSIS — N951 Menopausal and female climacteric states: Secondary | ICD-10-CM

## 2018-06-29 DIAGNOSIS — N852 Hypertrophy of uterus: Secondary | ICD-10-CM

## 2018-06-29 DIAGNOSIS — N8302 Follicular cyst of left ovary: Secondary | ICD-10-CM

## 2018-06-29 DIAGNOSIS — R232 Flushing: Secondary | ICD-10-CM | POA: Diagnosis not present

## 2018-06-29 DIAGNOSIS — D219 Benign neoplasm of connective and other soft tissue, unspecified: Secondary | ICD-10-CM | POA: Insufficient documentation

## 2018-06-29 MED ORDER — ESCITALOPRAM OXALATE 10 MG PO TABS: 10.0000 mg | ORAL_TABLET | Freq: Every day | ORAL | 11 refills | Status: DC

## 2018-06-29 NOTE — Progress Notes (Signed)
Patient ID: Brianna [name], female   DOB: 01-03-1971, 48 y.o.   MRN: [id number]  Reason for Consult: Follow-up (U/s follow up )   Referred by [name], FNP  Subjective:     HPI:  Brianna [name] is a 48 y.o. female . She is following up today for an [place]. Paxil has not been helping with hot flashes. She continues to have severe episodes that are causing anxiety and panic attacks. She feels like she is having emotional outbursts even though she knows that she is not being reasonable. She does not feel like herself.    She has been using a tampon to help with stress incontinence and she has noticed an improvement in her symptoms.  She had her diagnostic mammogram which was normal.   Past Medical History:  Diagnosis Date  . Anemia   . Chicken pox   . Depression   . Iron deficiency anemia 06/17/2018  . Jaundice due to hepatitis   . Restless leg syndrome    Family History  Problem Relation Age of Onset  . Early death Mother   . Heart disease Mother   . Hyperlipidemia Mother   . Hypertension Mother   . Alcohol abuse Father   . Arthritis Father   . COPD Father   . Hyperlipidemia Father   . Hypertension Father   . Kidney disease Father   . Arthritis Maternal Grandmother   . Stroke Maternal Grandmother   . Hyperlipidemia Maternal Grandfather   . Breast cancer Paternal Aunt 89   Past Surgical History:  Procedure Laterality Date  . CESAREAN SECTION  [phone]  . CHOLECYSTECTOMY  2003    Short Social History:  Social History   Tobacco Use  . Smoking status: Former Research scientist (life sciences)  . Smokeless tobacco: Never Used  Substance Use Topics  . Alcohol use: Yes    Comment: occasional     Allergies  Allergen Reactions  . Vicodin [Hydrocodone-Acetaminophen] Hives    Current Outpatient Medications  Medication Sig Dispense Refill  . acetaminophen (TYLENOL) 500 MG tablet Take 500 mg by mouth every 6 (six) hours as needed.    . ferrous sulfate 325 (65 FE) MG EC tablet Take 1 tablet  (325 mg total) by mouth 2 (two) times daily with a meal. 60 tablet 3  . Melatonin 10 MG TABS Take by mouth.    Marland Kitchen PARoxetine (PAXIL) 10 MG tablet Take 1 tablet (10 mg total) by mouth daily. 30 tablet 11  . pramipexole (MIRAPEX) 0.125 MG tablet Take 1 tablet every evening before bed. May increase to 2 tablets every evening after 1 week if one tablet not effective. 60 tablet 1  . escitalopram (LEXAPRO) 10 MG tablet Take 1 tablet (10 mg total) by mouth daily. 30 tablet 11   No current facility-administered medications for this visit.     Review of Systems  Constitutional: Negative for chills, fatigue, fever and unexpected weight change.  HENT: Negative for trouble swallowing.  Eyes: Negative for loss of vision.  Respiratory: Negative for cough, shortness of breath and wheezing.  Cardiovascular: Negative for chest pain, leg swelling, palpitations and syncope.  GI: Negative for abdominal pain, blood in stool, diarrhea, nausea and vomiting.  GU: Negative for difficulty urinating, dysuria, frequency and hematuria.  Musculoskeletal: Negative for back pain, leg pain and joint pain.  Skin: Negative for rash.  Neurological: Negative for dizziness, headaches, light-headedness, numbness and seizures.  Psychiatric: Negative for behavioral problem, confusion, depressed mood and sleep disturbance.  Objective:  Objective   Vitals:   06/29/18 1404  BP: (!) 150/90  Pulse: 87  Weight: 200 lb (90.7 kg)  Height: 5\' 5"  (1.651 m)   Body mass index is 33.28 kg/m.  Physical Exam Vitals signs and nursing note reviewed.  Constitutional:      Appearance: She is well-developed.  HENT:     Head: Normocephalic and atraumatic.  Eyes:     Pupils: Pupils are equal, round, and reactive to light.  Cardiovascular:     Rate and Rhythm: Normal rate and regular rhythm.  Pulmonary:     Effort: Pulmonary effort is normal. No respiratory distress.  Skin:    General: Skin is warm and dry.  Neurological:       Mental Status: She is alert and oriented to person, place, and time.  Psychiatric:        Behavior: Behavior normal.        Thought Content: Thought content normal.        Judgment: Judgment normal.        Assessment/Plan:     48 yo with perimenopause, fibroid uterus, and stress incontinence.   1) Bladder not full, will return for stress incontinence evaluation- is using a tampon currently which is helping.  2) Fibroids vs adenomyosis, no having any bleeding- expectant managment  3) Paxil has not helped with hot flash reduction, has taken for 3 weeks. Reports more symptoms of anxiety would like to try lexapro. Rx sent. After no periods for 1 year can initiate hormone replacement therapy.   More than 25 minutes were spent face to face with the patient in the room with more than 50% of the time spent providing counseling and discussing the plan of management. We discussed.    Adrian Prows MD [facility] 06/29/2018 3:04 PM

## 2018-06-30 ENCOUNTER — Inpatient Hospital Stay: Payer: PRIVATE HEALTH INSURANCE

## 2018-06-30 ENCOUNTER — Other Ambulatory Visit: Payer: Self-pay

## 2018-06-30 VITALS — BP 131/85 | HR 87 | Temp 95.5°F | Resp 18

## 2018-06-30 DIAGNOSIS — D509 Iron deficiency anemia, unspecified: Secondary | ICD-10-CM | POA: Diagnosis not present

## 2018-06-30 MED ORDER — SODIUM CHLORIDE 0.9 % IV SOLN
Freq: Once | INTRAVENOUS | Status: AC
  Administered 2018-06-30: 14:00:00 via INTRAVENOUS
  Filled 2018-06-30: qty 250

## 2018-06-30 MED ORDER — SODIUM CHLORIDE 0.9 % IV SOLN
510.0000 mg | Freq: Once | INTRAVENOUS | Status: AC
  Administered 2018-06-30: 510 mg via INTRAVENOUS
  Filled 2018-06-30: qty 17

## 2018-07-05 ENCOUNTER — Telehealth: Payer: Self-pay | Admitting: Gastroenterology

## 2018-07-05 NOTE — Telephone Encounter (Signed)
Pt left vm to cancel her procedure for 07/06/18 due to what's going on she would like a call to confirm it has been canceled

## 2018-07-05 NOTE — Telephone Encounter (Signed)
Colonoscopy has been canceled per patient request.  She has been contacted to confirm cancellation of 07/06/18.  Thanks [employer]

## 2018-07-06 ENCOUNTER — Encounter: Admission: RE | Payer: Self-pay | Source: Home / Self Care

## 2018-07-06 ENCOUNTER — Ambulatory Visit
Admission: RE | Admit: 2018-07-06 | Payer: PRIVATE HEALTH INSURANCE | Source: Home / Self Care | Admitting: Gastroenterology

## 2018-07-06 SURGERY — COLONOSCOPY WITH PROPOFOL
Anesthesia: General

## 2018-08-05 ENCOUNTER — Ambulatory Visit (INDEPENDENT_AMBULATORY_CARE_PROVIDER_SITE_OTHER): Payer: PRIVATE HEALTH INSURANCE | Admitting: Family Medicine

## 2018-08-05 ENCOUNTER — Other Ambulatory Visit: Payer: Self-pay

## 2018-08-05 DIAGNOSIS — G2581 Restless legs syndrome: Secondary | ICD-10-CM | POA: Diagnosis not present

## 2018-08-05 DIAGNOSIS — D509 Iron deficiency anemia, unspecified: Secondary | ICD-10-CM

## 2018-08-05 MED ORDER — ROPINIROLE HCL 1 MG PO TABS: ORAL_TABLET | ORAL | 2 refills | Status: DC

## 2018-08-05 MED ORDER — MAGNESIUM 200 MG PO TABS: 1.0000 | ORAL_TABLET | Freq: Every day | ORAL | 2 refills | Status: DC

## 2018-08-05 NOTE — Progress Notes (Signed)
Patient ID: Brianna [name], female   DOB: 1971/04/15, 48 y.o.   MRN: [id number]  Virtual Visit via video Note  This visit type was conducted due to national recommendations for restrictions regarding the COVID-19 pandemic (e.g. social distancing).  This format is felt to be most appropriate for this patient at this time.  All issues noted in this document were discussed and addressed.  No physical exam was performed (except for noted visual exam findings with Video Visits).   I connected with Brianna [name] on 08/05/18 at  3:40 PM EDT by a video enabled telemedicine application and verified that I am speaking with the correct person using two identifiers. Location patient: home Location provider: [facility] Persons participating in the virtual visit: patient, provider  I discussed the limitations, risks, security and privacy concerns of performing an evaluation and management service by telephone and the availability of in person appointments. I also discussed with the patient that there may be a patient responsible charge related to this service. The patient expressed understanding and agreed to proceed.  HPI:  Patient and I connected via video to follow-up on restless leg syndrome and to discuss her fatigue which was later discovered to be related to iron deficiency anemia.  Patient trialed Mirapex to see if this would help her restless leg syndrome.  States this medication did not help even at the increased dose.  States she will use melatonin to help get herself to sleep, but then is often awoken various times throughout the night due to restless legs.  Patient is following with hematology regularly for iron infusions.  She has planned infusion coming up in May 2020.  States since getting iron infusion, she is feeling better and her energy level has improved.  She was supposed to have a colonoscopy to further investigate possible causes for her iron deficiency anemia; however colonoscopy was  postponed due to the novel coronavirus pandemic.  ROS:   Constitutional: Negative for chills, fatigue and fever.  HENT: Negative for congestion, ear pain, sinus pain and sore throat.   Eyes: Negative.   Respiratory: Negative for cough, shortness of breath and wheezing.   Cardiovascular: Negative for chest pain, palpitations and leg swelling.  Gastrointestinal: Negative for abdominal pain, diarrhea, nausea and vomiting.  Genitourinary: Negative for dysuria, frequency and urgency.  Musculoskeletal: Negative for arthralgias and myalgias.  Skin: Negative for color change, pallor and rash.  Neurological: Negative for syncope, light-headedness and headaches. +RLS, keeps her awake, does not get restful sleep.  Psychiatric/Behavioral: The patient is not nervous/anxious.     Past Medical History:  Diagnosis Date  . Anemia   . Chicken pox   . Depression   . Iron deficiency anemia 06/17/2018  . Jaundice due to hepatitis   . Restless leg syndrome     Past Surgical History:  Procedure Laterality Date  . CESAREAN SECTION  [phone]  . CHOLECYSTECTOMY  2003    Family History  Problem Relation Age of Onset  . Early death Mother   . Heart disease Mother   . Hyperlipidemia Mother   . Hypertension Mother   . Alcohol abuse Father   . Arthritis Father   . COPD Father   . Hyperlipidemia Father   . Hypertension Father   . Kidney disease Father   . Arthritis Maternal Grandmother   . Stroke Maternal Grandmother   . Hyperlipidemia Maternal Grandfather   . Breast cancer Paternal Aunt 2   Social History   Tobacco Use  . Smoking  status: Former Research scientist (life sciences)  . Smokeless tobacco: Never Used  Substance Use Topics  . Alcohol use: Yes    Comment: occasional     Current Outpatient Medications:  .  acetaminophen (TYLENOL) 500 MG tablet, Take 500 mg by mouth every 6 (six) hours as needed., Disp: , Rfl:  .  escitalopram (LEXAPRO) 10 MG tablet, Take 1 tablet (10 mg total) by mouth daily., Disp:  30 tablet, Rfl: 11 .  ferrous sulfate 325 (65 FE) MG EC tablet, Take 1 tablet (325 mg total) by mouth 2 (two) times daily with a meal., Disp: 60 tablet, Rfl: 3 .  Melatonin 10 MG TABS, Take by mouth., Disp: , Rfl:  .  PARoxetine (PAXIL) 10 MG tablet, Take 1 tablet (10 mg total) by mouth daily., Disp: 30 tablet, Rfl: 11 .  pramipexole (MIRAPEX) 0.125 MG tablet, Take 1 tablet every evening before bed. May increase to 2 tablets every evening after 1 week if one tablet not effective., Disp: 60 tablet, Rfl: 1  EXAM:  GENERAL: alert, oriented, appears well and in no acute distress  HEENT: atraumatic, conjunttiva clear, no obvious abnormalities on inspection of external nose and ears  NECK: normal movements of the head and neck  LUNGS: on inspection no signs of respiratory distress, breathing rate appears normal, no obvious gross SOB, gasping or wheezing  CV: no obvious cyanosis  MS: moves all visible extremities without noticeable abnormality  PSYCH/NEURO: pleasant and cooperative, no obvious depression or anxiety, speech and thought processing grossly intact  ASSESSMENT AND PLAN:  Discussed the following assessment and plan:  Restless leg - Plan: rOPINIRole (REQUIP) 1 MG tablet, Magnesium 200 MG TABS  Iron deficiency anemia, unspecified iron deficiency anemia type  Patient will try Requip as treatment for restless leg.  Patient given instructions how to taper up dose to see if we can find an effective dose for her symptoms.  Advised to continue to use melatonin if needed to help with self get to sleep.  She will also begin a magnesium supplement to see if this helps her restless legs.  Patient would need to follow-up regularly with hematology regards to iron deficiency anemia.  Advised that her colonoscopy most likely will not be rescheduled until sometime in the summer 2020 due to the novel coronavirus pandemic, patient understands.   I discussed the assessment and treatment plan with  the patient. The patient was provided an opportunity to ask questions and all were answered. The patient agreed with the plan and demonstrated an understanding of the instructions.   The patient was advised to call back or seek an in-person evaluation if the symptoms worsen or if the condition fails to improve as anticipated.  Patient will follow-up in approximately 4 weeks for recheck on restless legs after trying requip  [name], FNP

## 2018-08-06 ENCOUNTER — Telehealth: Payer: Self-pay | Admitting: Family Medicine

## 2018-08-06 NOTE — Telephone Encounter (Signed)
Called Pt and scheduled a follow up appt for 09/02/18 @3 :40pm

## 2018-08-06 NOTE — Telephone Encounter (Signed)
Please call for 4 week follow up appt, virtual appt is good  Thanks,  LG

## 2018-08-25 ENCOUNTER — Encounter: Payer: Self-pay | Admitting: Family Medicine

## 2018-08-26 ENCOUNTER — Other Ambulatory Visit: Payer: Self-pay

## 2018-08-26 ENCOUNTER — Ambulatory Visit (INDEPENDENT_AMBULATORY_CARE_PROVIDER_SITE_OTHER): Payer: PRIVATE HEALTH INSURANCE | Admitting: Family Medicine

## 2018-08-26 DIAGNOSIS — M545 Low back pain, unspecified: Secondary | ICD-10-CM

## 2018-08-26 DIAGNOSIS — M62838 Other muscle spasm: Secondary | ICD-10-CM

## 2018-08-26 MED ORDER — CYCLOBENZAPRINE HCL 5 MG PO TABS: 5.0000 mg | ORAL_TABLET | Freq: Three times a day (TID) | ORAL | 1 refills | Status: DC | PRN

## 2018-08-26 NOTE — Telephone Encounter (Signed)
Pt has a Doxy visit today with NP Philis Nettle at 2:20pm

## 2018-08-26 NOTE — Progress Notes (Signed)
Patient ID: Brianna [name], female   DOB: 1971-01-20, 48 y.o.   MRN: [id number]    Virtual Visit via video Note  This visit type was conducted due to national recommendations for restrictions regarding the COVID-19 pandemic (e.g. social distancing).  This format is felt to be most appropriate for this patient at this time.  All issues noted in this document were discussed and addressed.  No physical exam was performed (except for noted visual exam findings with Video Visits).   I connected with Brianna [name] today at  2:20 PM EDT by a video enabled telemedicine application or telephone and verified that I am speaking with the correct person using two identifiers. Location patient: home Location provider: [facility] Persons participating in the virtual visit: patient, provider  I discussed the limitations, risks, security and privacy concerns of performing an evaluation and management service by video and the availability of in person appointments. I also discussed with the patient that there may be a patient responsible charge related to this service. The patient expressed understanding and agreed to proceed.  HPI:  Patient and I connected via video due to right low back pain above right hip bone that has been happening off and on over the past week.  Patient states yesterday the pain radiated around to her front slightly and she became concerned it could be a potential appendicitis.  States the pain improves with stretching and use of a topical rub like BenGay, and changing position from sitting to standing.  Denies any other injury to back.  Denies loss of bowel or bladder control.  Denies numbness in extremities.  Denies fever or chills.  Denies nausea, vomiting or diarrhea.  Denies cough, chest pain, SOB or wheezing.    ROS: See pertinent positives and negatives per HPI.  Past Medical History:  Diagnosis Date  . Anemia   . Chicken pox   . Depression   . Iron deficiency anemia  06/17/2018  . Jaundice due to hepatitis   . Restless leg syndrome     Past Surgical History:  Procedure Laterality Date  . CESAREAN SECTION  [phone]  . CHOLECYSTECTOMY  2003    Family History  Problem Relation Age of Onset  . Early death Mother   . Heart disease Mother   . Hyperlipidemia Mother   . Hypertension Mother   . Alcohol abuse Father   . Arthritis Father   . COPD Father   . Hyperlipidemia Father   . Hypertension Father   . Kidney disease Father   . Arthritis Maternal Grandmother   . Stroke Maternal Grandmother   . Hyperlipidemia Maternal Grandfather   . Breast cancer Paternal Aunt 68   Social History   Tobacco Use  . Smoking status: Former Research scientist (life sciences)  . Smokeless tobacco: Never Used  Substance Use Topics  . Alcohol use: Yes    Comment: occasional     Current Outpatient Medications:  .  acetaminophen (TYLENOL) 500 MG tablet, Take 500 mg by mouth every 6 (six) hours as needed., Disp: , Rfl:  .  escitalopram (LEXAPRO) 10 MG tablet, Take 1 tablet (10 mg total) by mouth daily., Disp: 30 tablet, Rfl: 11 .  ferrous sulfate 325 (65 FE) MG EC tablet, Take 1 tablet (325 mg total) by mouth 2 (two) times daily with a meal., Disp: 60 tablet, Rfl: 3 .  Magnesium 200 MG TABS, Take 1 tablet (200 mg total) by mouth daily., Disp: 30 each, Rfl: 2 .  Melatonin 10 MG TABS,  Take by mouth., Disp: , Rfl:  .  PARoxetine (PAXIL) 10 MG tablet, Take 1 tablet (10 mg total) by mouth daily., Disp: 30 tablet, Rfl: 11 .  rOPINIRole (REQUIP) 1 MG tablet, Start with 0.5 mg daily before bedtime for 5 days, then increase to 1 mg daily before bedtime. After 1 week in that is not effective, increase to 2 mg daily before bedtime. If 2 mg not effective after 1 week, increase to 3 mg daily before bedtime., Disp: 60 tablet, Rfl: 2  EXAM:  GENERAL: alert, oriented, appears well and in no acute distress  HEENT: atraumatic, conjunttiva clear, no obvious abnormalities on inspection of external nose  and ears  NECK: normal movements of the head and neck  LUNGS: on inspection no signs of respiratory distress, breathing rate appears normal, no obvious gross SOB, gasping or wheezing  CV: no obvious cyanosis  MS: moves all visible extremities without noticeable abnormality  PSYCH/NEURO: pleasant and cooperative, no obvious depression or anxiety, speech and thought processing grossly intact  ASSESSMENT AND PLAN:  Discussed the following assessment and plan:  Acute right-sided low back pain without sciatica - Plan: cyclobenzaprine (FLEXERIL) 5 MG tablet  Muscle spasm  Due to location of pain being on right lobe of right side of hip, I do not have strong suspicion for appendicitis.  Patient appears nontoxic over the video feed and has had no other symptoms that would indicate appendicitis.  Also states her pain is improved with changing in position, topical muscle rubs and stretching I have strong suspicion that it is more likely related to a muscle spasm/acute right low back pain due to musculoskeletal cause.  She will use Tylenol or topical rub as needed for pain and also I have prescribed Flexeril if needed for muscle spasm.  Advised to call us back if pain does not improve.   I discussed the assessment and treatment plan with the patient. The patient was provided an opportunity to ask questions and all were answered. The patient agreed with the plan and demonstrated an understanding of the instructions.   The patient was advised to call back or seek an in-person evaluation if the symptoms worsen or if the condition fails to improve as anticipated.   [name], FNP

## 2018-08-27 ENCOUNTER — Encounter: Payer: Self-pay | Admitting: Family Medicine

## 2018-09-02 ENCOUNTER — Telehealth: Payer: Self-pay | Admitting: Family Medicine

## 2018-09-02 ENCOUNTER — Ambulatory Visit (INDEPENDENT_AMBULATORY_CARE_PROVIDER_SITE_OTHER): Payer: PRIVATE HEALTH INSURANCE | Admitting: Family Medicine

## 2018-09-02 ENCOUNTER — Other Ambulatory Visit: Payer: Self-pay

## 2018-09-02 DIAGNOSIS — R600 Localized edema: Secondary | ICD-10-CM | POA: Diagnosis not present

## 2018-09-02 DIAGNOSIS — G2581 Restless legs syndrome: Secondary | ICD-10-CM

## 2018-09-02 MED ORDER — ROPINIROLE HCL 1 MG PO TABS: 1.0000 mg | ORAL_TABLET | Freq: Every day | ORAL | 2 refills | Status: DC

## 2018-09-02 MED ORDER — HYDROCHLOROTHIAZIDE 12.5 MG PO TABS: 12.5000 mg | ORAL_TABLET | Freq: Every day | ORAL | 3 refills | Status: DC

## 2018-09-02 NOTE — Progress Notes (Signed)
Patient ID: Brianna [name], female   DOB: [birth year], 48 y.o.   MRN: [id number]    Virtual Visit via videoNote  This visit type was conducted due to national recommendations for restrictions regarding the COVID-19 pandemic (e.g. social distancing).  This format is felt to be most appropriate for this patient at this time.  All issues noted in this document were discussed and addressed.  No physical exam was performed (except for noted visual exam findings with Video Visits).   I connected with Philippa Chester today at  3:40 PM EDT by a video enabled telemedicine application and verified that I am speaking with the correct person using two identifiers. Location patient: home Location provider: [facility] Persons participating in the virtual visit: patient, provider  I discussed the limitations, risks, security and privacy concerns of performing an evaluation and management service by video and the availability of in person appointments. I also discussed with the patient that there may be a patient responsible charge related to this service. The patient expressed understanding and agreed to proceed.  HPI: Patient and I connected via video for follow-up on restless leg syndrome after changing medication from pramipexole to Requip.  Patient had been off and on pramipexole for years without much success and her restless legs were getting to the point where she was barely getting any sleep at night, would get maybe an hour or 2 of sleep at a time before being woken up by restless legs.  Patient states that lack of sleep was making her extremely irritable and extremely frustrated.  She is currently taking 1 mg of Requip prior to bedtime.  Patient states the Requip has been amazing.  She is finally getting a full night sleep and her restless legs appear to be under control.  She is so pleased with how the medication has helped her restless legs and she feels back to her normal self because she is getting  proper rest so she is productive during the day and is not moody or frustrated.  Only negative with the Requip as she does notice a little bit of swelling in her feet ever since taking the medicine.  At times is noticed her shoes are a little tight when she puts them on.  Will elevate her legs and this seems to help reduce the swelling.  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  Denies fever or chills.  Denies chest pain or palpitations.  Denies GI or GU issues.  Denies shortness of breath or wheezing.   ROS: See pertinent positives and negatives per HPI.  Past Medical History:  Diagnosis Date  . Anemia   . Chicken pox   . Depression   . Iron deficiency anemia 06/17/2018  . Jaundice due to hepatitis   . Restless leg syndrome     Past Surgical History:  Procedure Laterality Date  . CESAREAN SECTION  [phone]  . CHOLECYSTECTOMY  2003    Family History  Problem Relation Age of Onset  . Early death Mother   . Heart disease Mother   . Hyperlipidemia Mother   . Hypertension Mother   . Alcohol abuse Father   . Arthritis Father   . COPD Father   . Hyperlipidemia Father   . Hypertension Father   . Kidney disease Father   . Arthritis Maternal Grandmother   . Stroke Maternal Grandmother   .  Hyperlipidemia Maternal Grandfather   . Breast cancer Paternal Aunt 50   Social History   Tobacco Use  . Smoking status: Former Research scientist (life sciences)  . Smokeless tobacco: Never Used  Substance Use Topics  . Alcohol use: Yes    Comment: occasional       Current Outpatient Medications:  .  acetaminophen (TYLENOL) 500 MG tablet, Take 500 mg by mouth every 6 (six) hours as needed., Disp: , Rfl:  .  cyclobenzaprine (FLEXERIL) 5 MG tablet, Take 1 tablet (5 mg total) by mouth 3 (three) times daily as needed for muscle spasms., Disp: 30 tablet, Rfl: 1 .  escitalopram (LEXAPRO)  10 MG tablet, Take 1 tablet (10 mg total) by mouth daily., Disp: 30 tablet, Rfl: 11 .  ferrous sulfate 325 (65 FE) MG EC tablet, Take 1 tablet (325 mg total) by mouth 2 (two) times daily with a meal., Disp: 60 tablet, Rfl: 3 .  Magnesium 200 MG TABS, Take 1 tablet (200 mg total) by mouth daily., Disp: 30 each, Rfl: 2 .  Melatonin 10 MG TABS, Take by mouth., Disp: , Rfl:  .  PARoxetine (PAXIL) 10 MG tablet, Take 1 tablet (10 mg total) by mouth daily., Disp: 30 tablet, Rfl: 11 .  rOPINIRole (REQUIP) 1 MG tablet, Start with 0.5 mg daily before bedtime for 5 days, then increase to 1 mg daily before bedtime. After 1 week in that is not effective, increase to 2 mg daily before bedtime. If 2 mg not effective after 1 week, increase to 3 mg daily before bedtime., Disp: 60 tablet, Rfl: 2  EXAM:  VITALS per patient if applicable:  GENERAL: alert, oriented, appears well and in no acute distress  HEENT: atraumatic, conjunttiva clear, no obvious abnormalities on inspection of external nose and ears  NECK: normal movements of the head and neck  LUNGS: on inspection no signs of respiratory distress, breathing rate appears normal, no obvious gross SOB, gasping or wheezing  CV: no obvious cyanosis.  No obvious swelling of lower extremity/feet seen over video feed.  MS: moves all visible extremities without noticeable abnormality  PSYCH/NEURO: pleasant and cooperative, no obvious depression or anxiety, speech and thought processing grossly intact  ASSESSMENT AND PLAN:  Discussed the following assessment and plan:  Bilateral lower extremity edema - Plan: hydrochlorothiazide (HYDRODIURIL) 12.5 MG tablet  Restless leg - Plan: rOPINIRole (REQUIP) 1 MG tablet  Patient has had great success with the Requip at the 1 mg dose prior to bedtime.  States she will deal with some of the minor swelling in feet due to how great the Requip has worked to calm her restless legs which allows her to get sleep.  We will  trial hydrochlorothiazide daily to help offset some of the swelling that is most likely caused by the Requip.  Also discussed elevating legs whenever able to begin wearing taller compression style stockings if she know she is good to be up and on her feet for extended periods of time during the day.  Also suggested patient can try bumping Requip dose down to 1.5 mg to see if this helps control her restless legs, but states she feels so great on the 1 mg she would prefer to stay on that and try the hydrochlorothiazide, elevating legs and compression stockings to offset the swelling.   I discussed the assessment and treatment plan with the patient. The patient was provided an opportunity to ask questions and all were answered. The patient agreed with the plan and demonstrated  an understanding of the instructions.   The patient was advised to call back or seek an in-person evaluation if the symptoms worsen or if the condition fails to improve as anticipated.  We will plan to have patient follow-up in approximately 3 months for recheck of chronic conditions and we can repeat blood work at that time.  She is aware she can call clinic sooner if any issues arise. [name], FNP

## 2018-09-02 NOTE — Telephone Encounter (Signed)
Please set patient up for 3 month follow up, in office visit  Thanks  LG

## 2018-09-03 NOTE — Telephone Encounter (Signed)
Appointment has been made

## 2018-09-07 ENCOUNTER — Encounter: Payer: Self-pay | Admitting: Obstetrics and Gynecology

## 2018-09-07 ENCOUNTER — Other Ambulatory Visit: Payer: Self-pay

## 2018-09-07 ENCOUNTER — Ambulatory Visit (INDEPENDENT_AMBULATORY_CARE_PROVIDER_SITE_OTHER): Payer: PRIVATE HEALTH INSURANCE | Admitting: Obstetrics and Gynecology

## 2018-09-07 VITALS — BP 132/82 | HR 99 | Ht 64.0 in | Wt 203.0 lb

## 2018-09-07 DIAGNOSIS — D252 Subserosal leiomyoma of uterus: Secondary | ICD-10-CM

## 2018-09-07 DIAGNOSIS — D250 Submucous leiomyoma of uterus: Secondary | ICD-10-CM

## 2018-09-07 DIAGNOSIS — D251 Intramural leiomyoma of uterus: Secondary | ICD-10-CM | POA: Diagnosis not present

## 2018-09-07 DIAGNOSIS — N939 Abnormal uterine and vaginal bleeding, unspecified: Secondary | ICD-10-CM | POA: Diagnosis not present

## 2018-09-07 MED ORDER — MEDROXYPROGESTERONE ACETATE 5 MG PO TABS: 5.0000 mg | ORAL_TABLET | Freq: Three times a day (TID) | ORAL | 0 refills | Status: DC

## 2018-09-07 NOTE — Progress Notes (Signed)
Patient ID: Brianna [name], female   DOB: 08/01/1970, 48 y.o.   MRN: [id number]  Reason for Consult: Menstrual Problem (Bleeding x3 weeks, last two days extremely heavy with clots the size of a quarter to a silver dollar )   Referred by [name], FNP  Subjective:     HPI:  Brianna [name] is a 48 y.o. female   She presents today for a problem visit because of heavy uterine bleeding. She reports that for the last 3 1/2 weeks she has been having daily persistent bleeding. Yesterday it became heavy. She has gone through 38 pads in the last day. She is changing a pad and tampon every hour. She has had some clot passage. She has some pain tenderness and bloating along her lower abdomen. She had been loosing weight but feels like she has regained 20 lbs in the last month.   Past Medical History:  Diagnosis Date  . Anemia   . Chicken pox   . Depression   . Iron deficiency anemia 06/17/2018  . Jaundice due to hepatitis   . Restless leg syndrome    Family History  Problem Relation Age of Onset  . Early death Mother   . Heart disease Mother   . Hyperlipidemia Mother   . Hypertension Mother   . Alcohol abuse Father   . Arthritis Father   . COPD Father   . Hyperlipidemia Father   . Hypertension Father   . Kidney disease Father   . Arthritis Maternal Grandmother   . Stroke Maternal Grandmother   . Hyperlipidemia Maternal Grandfather   . Breast cancer Paternal Aunt 72   Past Surgical History:  Procedure Laterality Date  . CESAREAN SECTION  [phone]  . CHOLECYSTECTOMY  2003    Short Social History:  Social History   Tobacco Use  . Smoking status: Former Research scientist (life sciences)  . Smokeless tobacco: Never Used  Substance Use Topics  . Alcohol use: Yes    Comment: occasional     Allergies  Allergen Reactions  . Vicodin [Hydrocodone-Acetaminophen] Hives    Current Outpatient Medications  Medication Sig Dispense Refill  . acetaminophen (TYLENOL) 500 MG tablet Take 500 mg by mouth  every 6 (six) hours as needed.    . cyclobenzaprine (FLEXERIL) 5 MG tablet Take 1 tablet (5 mg total) by mouth 3 (three) times daily as needed for muscle spasms. 30 tablet 1  . escitalopram (LEXAPRO) 10 MG tablet Take 1 tablet (10 mg total) by mouth daily. 30 tablet 11  . ferrous sulfate 325 (65 FE) MG EC tablet Take 1 tablet (325 mg total) by mouth 2 (two) times daily with a meal. 60 tablet 3  . hydrochlorothiazide (HYDRODIURIL) 12.5 MG tablet Take 1 tablet (12.5 mg total) by mouth daily. 90 tablet 3  . Magnesium 200 MG TABS Take 1 tablet (200 mg total) by mouth daily. 30 each 2  . rOPINIRole (REQUIP) 1 MG tablet Take 1 tablet (1 mg total) by mouth at bedtime. 30 tablet 2  . medroxyPROGESTERone (PROVERA) 5 MG tablet Take 1 tablet (5 mg total) by mouth 3 (three) times daily. After 1 week decrease to 2 times a day for 7 days, then 1 time a day for 7 days 42 tablet 0  . Melatonin 10 MG TABS Take by mouth.     No current facility-administered medications for this visit.     Review of Systems  Constitutional: Negative for chills, fatigue, fever and unexpected weight change.  HENT: Negative for  trouble swallowing.  Eyes: Negative for loss of vision.  Respiratory: Negative for cough, shortness of breath and wheezing.  Cardiovascular: Negative for chest pain, leg swelling, palpitations and syncope.  GI: Negative for abdominal pain, blood in stool, diarrhea, nausea and vomiting.  GU: Negative for difficulty urinating, dysuria, frequency and hematuria.  Musculoskeletal: Negative for back pain, leg pain and joint pain.  Skin: Negative for rash.  Neurological: Negative for dizziness, headaches, light-headedness, numbness and seizures.  Psychiatric: Negative for behavioral problem, confusion, depressed mood and sleep disturbance.        Objective:  Objective   Vitals:   09/07/18 1543  BP: 132/82  Pulse: 99  Weight: 203 lb (92.1 kg)  Height: 5\' 4"  (1.626 m)   Body mass index is 34.84 kg/m.   Physical Exam Vitals signs and nursing note reviewed.  Constitutional:      Appearance: She is well-developed.  HENT:     Head: Normocephalic and atraumatic.  Eyes:     Pupils: Pupils are equal, round, and reactive to light.  Cardiovascular:     Rate and Rhythm: Normal rate and regular rhythm.  Pulmonary:     Effort: Pulmonary effort is normal. No respiratory distress.  Genitourinary:    Comments: 5-10 cc of dark red blood in the vaginal vault.  Swept away. With observation and valsalva no significant blood came out of the cervical os. Enlarged bulky fibroid uterus to the level of the umbilicus. No CMT. No adnexal masses. No adhesions. Normal appearance of cervix and vagina.   Skin:    General: Skin is warm and dry.  Neurological:     Mental Status: She is alert and oriented to person, place, and time.  Psychiatric:        Behavior: Behavior normal.        Thought Content: Thought content normal.        Judgment: Judgment normal.        Assessment/Plan:     48 yo with heavy abnormal uterine bleeding.  Will start with progesterone and taper over next 3 weeks.  Enlarged bulky uterus will repeat US. Had fibroids vs adenomyosis on most recent US from March 2020.  Will need biopsy of uterine lining since she is over 45.  Pap smear 06/16/2018 was NIL.  Finger stick hgb was 13.3. Advised patient to go to the ER if bleeding worsens or she becomes dizzy or faint.  Follow up in 1 week for [place].   More than 20 minutes were spent face to face with the patient in the room with more than 50% of the time spent providing counseling and discussing the plan of management.     Adrian Prows MD [facility], [facility] 09/07/2018 4:22 PM

## 2018-09-16 ENCOUNTER — Other Ambulatory Visit (HOSPITAL_COMMUNITY)
Admission: RE | Admit: 2018-09-16 | Discharge: 2018-09-16 | Disposition: A | Discharge status: Home / Self Care | Payer: PRIVATE HEALTH INSURANCE | Source: Ambulatory Visit | Attending: Obstetrics and Gynecology | Admitting: Obstetrics and Gynecology

## 2018-09-16 ENCOUNTER — Encounter: Payer: Self-pay | Admitting: Obstetrics and Gynecology

## 2018-09-16 ENCOUNTER — Other Ambulatory Visit: Payer: Self-pay

## 2018-09-16 ENCOUNTER — Ambulatory Visit (INDEPENDENT_AMBULATORY_CARE_PROVIDER_SITE_OTHER): Payer: PRIVATE HEALTH INSURANCE | Admitting: Obstetrics and Gynecology

## 2018-09-16 ENCOUNTER — Ambulatory Visit (INDEPENDENT_AMBULATORY_CARE_PROVIDER_SITE_OTHER): Payer: PRIVATE HEALTH INSURANCE

## 2018-09-16 VITALS — BP 140/90 | HR 98 | Ht 65.0 in | Wt 201.0 lb

## 2018-09-16 DIAGNOSIS — D252 Subserosal leiomyoma of uterus: Secondary | ICD-10-CM

## 2018-09-16 DIAGNOSIS — N939 Abnormal uterine and vaginal bleeding, unspecified: Secondary | ICD-10-CM

## 2018-09-16 DIAGNOSIS — D250 Submucous leiomyoma of uterus: Secondary | ICD-10-CM

## 2018-09-16 DIAGNOSIS — N8000 Endometriosis of the uterus, unspecified: Secondary | ICD-10-CM

## 2018-09-16 DIAGNOSIS — D251 Intramural leiomyoma of uterus: Secondary | ICD-10-CM

## 2018-09-16 DIAGNOSIS — Z30430 Encounter for insertion of intrauterine contraceptive device: Secondary | ICD-10-CM

## 2018-09-16 NOTE — Progress Notes (Signed)
Patient ID: Brianna [name], female   DOB: 1971-01-12, 48 y.o.   MRN: [id number]  Reason for Consult: Follow-up (still having some bleeding but it has gotten better)   Referred by [name], FNP  Subjective:     HPI:  Brianna [name] is a 48 y.o. female   Past Medical History:  Diagnosis Date  . Anemia   . Chicken pox   . Depression   . Iron deficiency anemia 06/17/2018  . Jaundice due to hepatitis   . Restless leg syndrome    Family History  Problem Relation Age of Onset  . Early death Mother   . Heart disease Mother   . Hyperlipidemia Mother   . Hypertension Mother   . Alcohol abuse Father   . Arthritis Father   . COPD Father   . Hyperlipidemia Father   . Hypertension Father   . Kidney disease Father   . Arthritis Maternal Grandmother   . Stroke Maternal Grandmother   . Hyperlipidemia Maternal Grandfather   . Breast cancer Paternal Aunt 62   Past Surgical History:  Procedure Laterality Date  . CESAREAN SECTION  [phone]  . CHOLECYSTECTOMY  2003    Short Social History:  Social History   Tobacco Use  . Smoking status: Former Research scientist (life sciences)  . Smokeless tobacco: Never Used  Substance Use Topics  . Alcohol use: Yes    Comment: occasional     Allergies  Allergen Reactions  . Vicodin [Hydrocodone-Acetaminophen] Hives    Current Outpatient Medications  Medication Sig Dispense Refill  . cyclobenzaprine (FLEXERIL) 5 MG tablet Take 1 tablet (5 mg total) by mouth 3 (three) times daily as needed for muscle spasms. 30 tablet 1  . escitalopram (LEXAPRO) 10 MG tablet Take 1 tablet (10 mg total) by mouth daily. 30 tablet 11  . ferrous sulfate 325 (65 FE) MG EC tablet Take 1 tablet (325 mg total) by mouth 2 (two) times daily with a meal. 60 tablet 3  . hydrochlorothiazide (HYDRODIURIL) 12.5 MG tablet Take 1 tablet (12.5 mg total) by mouth daily. 90 tablet 3  . Magnesium 200 MG TABS Take 1 tablet (200 mg total) by mouth daily. 30 each 2  . medroxyPROGESTERone  (PROVERA) 5 MG tablet Take 1 tablet (5 mg total) by mouth 3 (three) times daily. After 1 week decrease to 2 times a day for 7 days, then 1 time a day for 7 days 42 tablet 0  . rOPINIRole (REQUIP) 1 MG tablet Take 1 tablet (1 mg total) by mouth at bedtime. 30 tablet 2  . acetaminophen (TYLENOL) 500 MG tablet Take 500 mg by mouth every 6 (six) hours as needed.    . Melatonin 10 MG TABS Take by mouth.     No current facility-administered medications for this visit.     REVIEW OF SYSTEMS      Objective:  Objective   Vitals:   09/16/18 1345  BP: 140/90  Pulse: 98  Weight: 201 lb (91.2 kg)  Height: 5\' 5"  (1.651 m)   Body mass index is 33.45 kg/m.  Physical Exam   Endometrial Biopsy After discussion with the patient regarding her abnormal uterine bleeding I recommended that she proceed with an endometrial biopsy for further diagnosis. The risks, benefits, alternatives, and indications for an endometrial biopsy were discussed with the patient in detail. She understood the risks including infection, bleeding, cervical laceration and uterine perforation.  Verbal consent was obtained.   PROCEDURE NOTE:  Pipelle endometrial biopsy was performed  using aseptic technique with iodine preparation.  The uterus was sounded to a length of 9 cm.  Adequate sampling was obtained with minimal blood loss.  The patient tolerated the procedure well.  Disposition will be pending pathology.  IUD PROCEDURE NOTE:  Brianna [name] is a 48 y.o. [phone] here for IUD insertion. No GYN concerns.  Last pap smear was normal.  IUD Insertion Procedure Note Patient identified, informed consent performed, consent signed.   Discussed risks of irregular bleeding, cramping, infection, malpositioning or misplacement of the IUD outside the uterus which may require further procedure such as laparoscopy, risk of failure <1%. Time out was performed.   A bimanual exam showed the uterus to be anteverted.  Speculum placed in the  vagina.  Cervix visualized.  Cleaned with Betadine x 2.  Grasped anteriorly with a single tooth tenaculum.  Uterus sounded to 9 cm.   IUD placed per manufacturer's recommendations.  Strings trimmed to 3 cm. Tenaculum was removed, good hemostasis noted.  Patient tolerated procedure well.   Patient was given post-procedure instructions.  She was advised to have backup contraception for one week.  Patient was also asked to check IUD strings periodically and follow up in 4 weeks for IUD check.       Assessment/Plan:     IUD inserted     Adrian Prows MD [place] 09/16/2018 2:46 PM

## 2018-09-17 NOTE — Progress Notes (Deleted)
[place]  [phone] [phone]  ID: Brianna [name] OB: 25-Jun-1970  MR#: [id number]  GMW#:[id number]  Patient Care Team: [name], FNP as PCP - General (Family Medicine)  CHIEF COMPLAINT: Iron deficiency anemia.  INTERVAL HISTORY: Patient is a 48 year old female who was noted to have a significantly decreased hemoglobin and iron stores on routine blood work.  She has increased weakness and fatigue, but otherwise feels well.  She has no neurologic complaints.  She denies any recent fevers or illnesses.  She has a good appetite and denies weight loss.  She has no chest pain or shortness of breath.  She denies any nausea, vomiting, constipation, or diarrhea.  She denies any melena or hematochezia.  She has no urinary complaints.  Patient otherwise feels well and offers no further specific complaints today.  REVIEW OF SYSTEMS:   Review of Systems  Constitutional: Positive for malaise/fatigue. Negative for fever and weight loss.  Respiratory: Negative.  Negative for cough, hemoptysis and shortness of breath.   Cardiovascular: Negative.  Negative for chest pain and leg swelling.  Gastrointestinal: Negative.  Negative for abdominal pain, blood in stool and melena.  Genitourinary: Negative.  Negative for hematuria.  Musculoskeletal: Negative.  Negative for back pain.  Skin: Negative.  Negative for rash.  Neurological: Negative.  Negative for focal weakness, weakness and headaches.  Psychiatric/Behavioral: Negative.  The patient is not nervous/anxious.     As per HPI. Otherwise, a complete review of systems is negative.  PAST MEDICAL HISTORY: Past Medical History:  Diagnosis Date  . Anemia   . Chicken pox   . Depression   . Iron deficiency anemia 06/17/2018  . Jaundice due to hepatitis   . Restless leg syndrome     PAST SURGICAL HISTORY: Past Surgical History:  Procedure Laterality Date  . CESAREAN SECTION  [phone]  .  CHOLECYSTECTOMY  2003    FAMILY HISTORY: Family History  Problem Relation Age of Onset  . Early death Mother   . Heart disease Mother   . Hyperlipidemia Mother   . Hypertension Mother   . Alcohol abuse Father   . Arthritis Father   . COPD Father   . Hyperlipidemia Father   . Hypertension Father   . Kidney disease Father   . Arthritis Maternal Grandmother   . Stroke Maternal Grandmother   . Hyperlipidemia Maternal Grandfather   . Breast cancer Paternal Aunt 43    ADVANCED DIRECTIVES (Y/N):  N  HEALTH MAINTENANCE: Social History   Tobacco Use  . Smoking status: Former Research scientist (life sciences)  . Smokeless tobacco: Never Used  Substance Use Topics  . Alcohol use: Yes    Comment: occasional   . Drug use: Never     Colonoscopy:  PAP:  Bone density:  Lipid panel:  Allergies  Allergen Reactions  . Vicodin [Hydrocodone-Acetaminophen] Hives    Current Outpatient Medications  Medication Sig Dispense Refill  . acetaminophen (TYLENOL) 500 MG tablet Take 500 mg by mouth every 6 (six) hours as needed.    . cyclobenzaprine (FLEXERIL) 5 MG tablet Take 1 tablet (5 mg total) by mouth 3 (three) times daily as needed for muscle spasms. 30 tablet 1  . escitalopram (LEXAPRO) 10 MG tablet Take 1 tablet (10 mg total) by mouth daily. 30 tablet 11  . ferrous sulfate 325 (65 FE) MG EC tablet Take 1 tablet (325 mg total) by mouth 2 (two) times daily with a meal. 60 tablet 3  . hydrochlorothiazide (HYDRODIURIL) 12.5 MG tablet  Take 1 tablet (12.5 mg total) by mouth daily. 90 tablet 3  . Magnesium 200 MG TABS Take 1 tablet (200 mg total) by mouth daily. 30 each 2  . medroxyPROGESTERone (PROVERA) 5 MG tablet Take 1 tablet (5 mg total) by mouth 3 (three) times daily. After 1 week decrease to 2 times a day for 7 days, then 1 time a day for 7 days 42 tablet 0  . Melatonin 10 MG TABS Take by mouth.    Marland Kitchen rOPINIRole (REQUIP) 1 MG tablet Take 1 tablet (1 mg total) by mouth at bedtime. 30 tablet 2   No current  facility-administered medications for this visit.     OBJECTIVE: There were no vitals filed for this visit.   There is no height or weight on file to calculate BMI.    ECOG FS:0 - Asymptomatic  General: Well-developed, well-nourished, no acute distress. Eyes: Pink conjunctiva, anicteric sclera. HEENT: Normocephalic, moist mucous membranes, clear oropharnyx. Lungs: Clear to auscultation bilaterally. Heart: Regular rate and rhythm. No rubs, murmurs, or gallops. Abdomen: Soft, nontender, nondistended. No organomegaly noted, normoactive bowel sounds. Musculoskeletal: No edema, cyanosis, or clubbing. Neuro: Alert, answering all questions appropriately. Cranial nerves grossly intact. Skin: No rashes or petechiae noted. Psych: Normal affect. Lymphatics: No cervical, calvicular, axillary or inguinal LAD.   LAB RESULTS:  Lab Results  Component Value Date   NA 138 05/25/2018   K 4.1 05/25/2018   CL 103 05/25/2018   CO2 28 05/25/2018   GLUCOSE 96 05/25/2018   BUN 11 05/25/2018   CREATININE 0.47 05/25/2018   CALCIUM 8.9 05/25/2018   PROT 6.9 05/25/2018   ALBUMIN 4.4 05/25/2018   AST 11 05/25/2018   ALT 10 05/25/2018   ALKPHOS 43 05/25/2018   BILITOT 0.3 05/25/2018    Lab Results  Component Value Date   WBC 6.5 06/10/2018   HGB 9.1 (L) 06/10/2018   HCT 30.3 (L) 06/10/2018   MCV 77.7 (L) 06/10/2018   PLT 343.0 06/10/2018   Lab Results  Component Value Date   IRON 12 (L) 06/10/2018   TIBC 411 06/10/2018   IRONPCTSAT 3 (L) 06/10/2018   Lab Results  Component Value Date   FERRITIN 5 (L) 06/10/2018     STUDIES: No results found.  ASSESSMENT: Iron deficiency anemia  PLAN:    1. Iron deficiency anemia: Likely from chronic blood loss secondary to previous history of reported heavy menses.  Patient's hemoglobin and iron stores are significantly reduced and she will benefit from IV Feraheme.  She has been instructed to continue taking her oral iron supplementation as  prescribed.  If patient's hemoglobin does not improve with iron infusions, will consider a full anemia work-up at a later date.  Also agree with the recommendation of GI referral for consideration of colonoscopy.  Return to clinic in 1 and 2 weeks for 510 mg IV Feraheme.  Patient will then return to clinic in 3 months with repeat laboratory work and further evaluation.  I spent a total of 45 minutes face-to-face with the patient of which greater than 50% of the visit was spent in counseling and coordination of care as detailed above.   Patient expressed understanding and was in agreement with this plan. She also understands that She can call clinic at any time with any questions, concerns, or complaints.    Lloyd Huger, MD   09/17/2018 1:10 PM

## 2018-09-22 ENCOUNTER — Other Ambulatory Visit: Payer: Self-pay

## 2018-09-22 ENCOUNTER — Inpatient Hospital Stay: Payer: PRIVATE HEALTH INSURANCE | Attending: Oncology

## 2018-09-22 DIAGNOSIS — Z79899 Other long term (current) drug therapy: Secondary | ICD-10-CM | POA: Insufficient documentation

## 2018-09-22 DIAGNOSIS — D509 Iron deficiency anemia, unspecified: Secondary | ICD-10-CM | POA: Insufficient documentation

## 2018-09-22 DIAGNOSIS — Z87891 Personal history of nicotine dependence: Secondary | ICD-10-CM | POA: Insufficient documentation

## 2018-09-22 LAB — CBC WITH DIFFERENTIAL/PLATELET
Abs Immature Granulocytes: 0.02 10*3/uL (ref 0.00–0.07)
Basophils Absolute: 0 10*3/uL (ref 0.0–0.1)
Basophils Relative: 1 %
Eosinophils Absolute: 0.1 10*3/uL (ref 0.0–0.5)
Eosinophils Relative: 2 %
HCT: 33.6 % — ABNORMAL LOW (ref 36.0–46.0)
Hemoglobin: 11 g/dL — ABNORMAL LOW (ref 12.0–15.0)
Immature Granulocytes: 0 %
Lymphocytes Relative: 20 %
Lymphs Abs: 1.1 10*3/uL (ref 0.7–4.0)
MCH: 30.7 pg (ref 26.0–34.0)
MCHC: 32.7 g/dL (ref 30.0–36.0)
MCV: 93.9 fL (ref 80.0–100.0)
Monocytes Absolute: 0.4 10*3/uL (ref 0.1–1.0)
Monocytes Relative: 7 %
Neutro Abs: 3.9 10*3/uL (ref 1.7–7.7)
Neutrophils Relative %: 70 %
Platelets: 271 10*3/uL (ref 150–400)
RBC: 3.58 MIL/uL — ABNORMAL LOW (ref 3.87–5.11)
RDW: 14.1 % (ref 11.5–15.5)
WBC: 5.6 10*3/uL (ref 4.0–10.5)
nRBC: 0 % (ref 0.0–0.2)

## 2018-09-22 LAB — IRON AND TIBC
Iron: 19 ug/dL — ABNORMAL LOW (ref 28–170)
Saturation Ratios: 5 % — ABNORMAL LOW (ref 10.4–31.8)
TIBC: 387 ug/dL (ref 250–450)
UIBC: 368 ug/dL

## 2018-09-22 LAB — FERRITIN: Ferritin: 9 ng/mL — ABNORMAL LOW (ref 11–307)

## 2018-09-24 ENCOUNTER — Inpatient Hospital Stay: Payer: PRIVATE HEALTH INSURANCE

## 2018-09-24 ENCOUNTER — Other Ambulatory Visit: Payer: PRIVATE HEALTH INSURANCE

## 2018-09-24 ENCOUNTER — Inpatient Hospital Stay: Payer: PRIVATE HEALTH INSURANCE | Admitting: Oncology

## 2018-09-24 NOTE — Progress Notes (Signed)
Normal tissue, no malignancy, Called and discussed with patient

## 2018-10-03 NOTE — Progress Notes (Deleted)
[place]  [phone] [phone]  ID: Brianna [name] OB: 1970/12/19  MR#: [id number]  ZCH#:[id number]  Patient Care Team: [name], FNP as PCP - General (Family Medicine)  CHIEF COMPLAINT: Iron deficiency anemia.  INTERVAL HISTORY: Patient is a 48 year old female who was noted to have a significantly decreased hemoglobin and iron stores on routine blood work.  She has increased weakness and fatigue, but otherwise feels well.  She has no neurologic complaints.  She denies any recent fevers or illnesses.  She has a good appetite and denies weight loss.  She has no chest pain or shortness of breath.  She denies any nausea, vomiting, constipation, or diarrhea.  She denies any melena or hematochezia.  She has no urinary complaints.  Patient otherwise feels well and offers no further specific complaints today.  REVIEW OF SYSTEMS:   Review of Systems  Constitutional: Positive for malaise/fatigue. Negative for fever and weight loss.  Respiratory: Negative.  Negative for cough, hemoptysis and shortness of breath.   Cardiovascular: Negative.  Negative for chest pain and leg swelling.  Gastrointestinal: Negative.  Negative for abdominal pain, blood in stool and melena.  Genitourinary: Negative.  Negative for hematuria.  Musculoskeletal: Negative.  Negative for back pain.  Skin: Negative.  Negative for rash.  Neurological: Negative.  Negative for focal weakness, weakness and headaches.  Psychiatric/Behavioral: Negative.  The patient is not nervous/anxious.     As per HPI. Otherwise, a complete review of systems is negative.  PAST MEDICAL HISTORY: Past Medical History:  Diagnosis Date   Anemia    Chicken pox    Depression    Iron deficiency anemia 06/17/2018   Jaundice due to hepatitis    Restless leg syndrome     PAST SURGICAL HISTORY: Past Surgical History:  Procedure Laterality Date   CESAREAN SECTION  1992,1998,2003    CHOLECYSTECTOMY  2003    FAMILY HISTORY: Family History  Problem Relation Age of Onset   Early death Mother    Heart disease Mother    Hyperlipidemia Mother    Hypertension Mother    Alcohol abuse Father    Arthritis Father    COPD Father    Hyperlipidemia Father    Hypertension Father    Kidney disease Father    Arthritis Maternal Grandmother    Stroke Maternal Grandmother    Hyperlipidemia Maternal Grandfather    Breast cancer Paternal Aunt 72    ADVANCED DIRECTIVES (Y/N):  N  HEALTH MAINTENANCE: Social History   Tobacco Use   Smoking status: Former Smoker   Smokeless tobacco: Never Used  Substance Use Topics   Alcohol use: Yes    Comment: occasional    Drug use: Never     Colonoscopy:  PAP:  Bone density:  Lipid panel:  Allergies  Allergen Reactions   Vicodin [Hydrocodone-Acetaminophen] Hives    Current Outpatient Medications  Medication Sig Dispense Refill   acetaminophen (TYLENOL) 500 MG tablet Take 500 mg by mouth every 6 (six) hours as needed.     cyclobenzaprine (FLEXERIL) 5 MG tablet Take 1 tablet (5 mg total) by mouth 3 (three) times daily as needed for muscle spasms. 30 tablet 1   escitalopram (LEXAPRO) 10 MG tablet Take 1 tablet (10 mg total) by mouth daily. 30 tablet 11   ferrous sulfate 325 (65 FE) MG EC tablet Take 1 tablet (325 mg total) by mouth 2 (two) times daily with a meal. 60 tablet 3   hydrochlorothiazide (HYDRODIURIL) 12.5 MG tablet  Take 1 tablet (12.5 mg total) by mouth daily. 90 tablet 3   Magnesium 200 MG TABS Take 1 tablet (200 mg total) by mouth daily. 30 each 2   medroxyPROGESTERone (PROVERA) 5 MG tablet Take 1 tablet (5 mg total) by mouth 3 (three) times daily. After 1 week decrease to 2 times a day for 7 days, then 1 time a day for 7 days 42 tablet 0   Melatonin 10 MG TABS Take by mouth.     rOPINIRole (REQUIP) 1 MG tablet Take 1 tablet (1 mg total) by mouth at bedtime. 30 tablet 2   No current  facility-administered medications for this visit.     OBJECTIVE: There were no vitals filed for this visit.   There is no height or weight on file to calculate BMI.    ECOG FS:0 - Asymptomatic  General: Well-developed, well-nourished, no acute distress. Eyes: Pink conjunctiva, anicteric sclera. HEENT: Normocephalic, moist mucous membranes, clear oropharnyx. Lungs: Clear to auscultation bilaterally. Heart: Regular rate and rhythm. No rubs, murmurs, or gallops. Abdomen: Soft, nontender, nondistended. No organomegaly noted, normoactive bowel sounds. Musculoskeletal: No edema, cyanosis, or clubbing. Neuro: Alert, answering all questions appropriately. Cranial nerves grossly intact. Skin: No rashes or petechiae noted. Psych: Normal affect. Lymphatics: No cervical, calvicular, axillary or inguinal LAD.   LAB RESULTS:  Lab Results  Component Value Date   NA 138 05/25/2018   K 4.1 05/25/2018   CL 103 05/25/2018   CO2 28 05/25/2018   GLUCOSE 96 05/25/2018   BUN 11 05/25/2018   CREATININE 0.47 05/25/2018   CALCIUM 8.9 05/25/2018   PROT 6.9 05/25/2018   ALBUMIN 4.4 05/25/2018   AST 11 05/25/2018   ALT 10 05/25/2018   ALKPHOS 43 05/25/2018   BILITOT 0.3 05/25/2018    Lab Results  Component Value Date   WBC 5.6 09/22/2018   NEUTROABS 3.9 09/22/2018   HGB 11.0 (L) 09/22/2018   HCT 33.6 (L) 09/22/2018   MCV 93.9 09/22/2018   PLT 271 09/22/2018   Lab Results  Component Value Date   IRON 19 (L) 09/22/2018   TIBC 387 09/22/2018   IRONPCTSAT 5 (L) 09/22/2018   Lab Results  Component Value Date   FERRITIN 9 (L) 09/22/2018     STUDIES: US Pelvis Transvanginal Non-ob (tv Only)  Result Date: 09/20/2018 Patient Name: Brianna [name] DOB: [birth year] MRN: [id number] ULTRASOUND REPORT Location: [facility] Date of Service: 09/16/2018 Indications:Abnormal Uterine Bleeding Findings: The uterus is anteverted and measures 9.8 x 6.9 x 6.1cm. Echo texture is heterogenous with evidence of  focal mass. Again seen within the uterus is one suspected fibroid vs area of adenomyosis measuring 2.8 x 2.6 x 2.6cm. The Endometrium appears heterogeneous and measures 10.7 mm. Possible clotted blood. Right Ovary is not seen. Left Ovary measures 1.9 x 1.6 x 1.3 cm. It is normal in appearance. Survey of the adnexa demonstrates no adnexal masses. There is no free fluid in the cul de sac. Impression: 1. Area within the anterior myometrium again seen. Possible fibroid vs adenomyosis. 2. Heterogeneous endometrial canal. Recommendations: 1.Clinical correlation with the patient's History and Physical Exam. Vita Barley, RT I have reviewed this ultrasound and the report. I agree with the above assessment and plan. [facility] 09/20/18 8:06 AM     ASSESSMENT: Iron deficiency anemia  PLAN:    1. Iron deficiency anemia: Likely from chronic blood loss secondary to previous history of reported heavy menses.  Patient's hemoglobin  and iron stores are significantly reduced and she will benefit from IV Feraheme.  She has been instructed to continue taking her oral iron supplementation as prescribed.  If patient's hemoglobin does not improve with iron infusions, will consider a full anemia work-up at a later date.  Also agree with the recommendation of GI referral for consideration of colonoscopy.  Return to clinic in 1 and 2 weeks for 510 mg IV Feraheme.  Patient will then return to clinic in 3 months with repeat laboratory work and further evaluation.  I spent a total of 45 minutes face-to-face with the patient of which greater than 50% of the visit was spent in counseling and coordination of care as detailed above.   Patient expressed understanding and was in agreement with this plan. She also understands that She can call clinic at any time with any questions, concerns, or complaints.    Lloyd Huger, MD   10/03/2018 11:10 AM

## 2018-10-06 ENCOUNTER — Encounter: Payer: Self-pay | Admitting: Family Medicine

## 2018-10-06 DIAGNOSIS — M545 Low back pain, unspecified: Secondary | ICD-10-CM

## 2018-10-07 ENCOUNTER — Inpatient Hospital Stay: Payer: PRIVATE HEALTH INSURANCE | Admitting: Oncology

## 2018-10-07 ENCOUNTER — Inpatient Hospital Stay: Payer: PRIVATE HEALTH INSURANCE

## 2018-10-07 MED ORDER — CYCLOBENZAPRINE HCL 5 MG PO TABS: 5.0000 mg | ORAL_TABLET | Freq: Three times a day (TID) | ORAL | 1 refills | Status: DC | PRN

## 2018-10-07 NOTE — Addendum Note (Signed)
Addended by: Philis Nettle on: 10/07/2018 09:55 AM   Modules accepted: Orders

## 2018-11-05 ENCOUNTER — Other Ambulatory Visit: Payer: Self-pay | Admitting: Lab

## 2018-11-05 ENCOUNTER — Encounter: Payer: Self-pay | Admitting: Family Medicine

## 2018-11-05 DIAGNOSIS — G2581 Restless legs syndrome: Secondary | ICD-10-CM

## 2018-11-05 MED ORDER — ROPINIROLE HCL 1 MG PO TABS: 1.0000 mg | ORAL_TABLET | Freq: Every day | ORAL | 2 refills | Status: DC

## 2018-12-03 ENCOUNTER — Ambulatory Visit (INDEPENDENT_AMBULATORY_CARE_PROVIDER_SITE_OTHER): Payer: PRIVATE HEALTH INSURANCE | Admitting: Family Medicine

## 2018-12-03 ENCOUNTER — Other Ambulatory Visit: Payer: Self-pay

## 2018-12-03 DIAGNOSIS — G2581 Restless legs syndrome: Secondary | ICD-10-CM | POA: Diagnosis not present

## 2018-12-03 DIAGNOSIS — M545 Low back pain, unspecified: Secondary | ICD-10-CM

## 2018-12-03 DIAGNOSIS — R600 Localized edema: Secondary | ICD-10-CM | POA: Diagnosis not present

## 2018-12-03 MED ORDER — PREDNISONE 10 MG (21) PO TBPK: ORAL_TABLET | ORAL | 0 refills | Status: DC

## 2018-12-03 MED ORDER — CYCLOBENZAPRINE HCL 5 MG PO TABS: 5.0000 mg | ORAL_TABLET | Freq: Three times a day (TID) | ORAL | 1 refills | Status: DC | PRN

## 2018-12-03 NOTE — Progress Notes (Signed)
Patient ID: Brianna [name], female   DOB: October 05, 1970, 48 y.o.   MRN: [id number]    Virtual Visit via video Note  This visit type was conducted due to national recommendations for restrictions regarding the COVID-19 pandemic (e.g. social distancing).  This format is felt to be most appropriate for this patient at this time.  All issues noted in this document were discussed and addressed.  No physical exam was performed (except for noted visual exam findings with Video Visits).   I connected with [name] today at  1:20 PM EDT by a video enabled telemedicine application and verified that I am speaking with the correct person using two identifiers. Location patient: home Location provider: work or home office Persons participating in the virtual visit: patient, provider  I discussed the limitations, risks, security and privacy concerns of performing an evaluation and management service by telephone and the availability of in person appointments. I also discussed with the patient that there may be a patient responsible charge related to this service. The patient expressed understanding and agreed to proceed.  HPI:  Patient and I connected via video to follow-up on restless leg syndrome and lower extremity swelling.  Patient states the Requip has been a "godsend" for her restless legs and she is finally getting restful sleep through the night and feels energized to complete her day.  Since adding on low-dose hydrochlorothiazide, patient states the swelling in lower extremities has gone away.  Also plans to use tall socks during the colder winter months, does not like wearing tall socks during the summer due to the warm temperatures.  She is also more conscious of the salt intake in her diet.  Denies any chest pain, palpitations, shortness of breath or wheezing.  Patient also reports some right-sided low back pain that will radiate down right leg at times.  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  Patient is self-employed.   ROS: See pertinent positives and negatives per HPI.  Past Medical History:  Diagnosis Date  . Anemia   . Chicken pox   . Depression   . Iron deficiency anemia 06/17/2018  . Jaundice due to hepatitis   . Restless leg syndrome     Past Surgical History:  Procedure Laterality Date  . CESAREAN SECTION  [phone]  . CHOLECYSTECTOMY  2003    Family History  Problem Relation Age of Onset  . Early death Mother   . Heart disease Mother   . Hyperlipidemia Mother   . Hypertension Mother   . Alcohol abuse Father   . Arthritis Father   . COPD Father   . Hyperlipidemia Father   . Hypertension Father   . Kidney disease Father   . Arthritis Maternal Grandmother   . Stroke Maternal Grandmother   . Hyperlipidemia Maternal Grandfather   . Breast cancer Paternal Aunt 67    Social History   Tobacco Use  . Smoking status: Former Research scientist (life sciences)  . Smokeless tobacco: Never Used  Substance Use Topics  . Alcohol use: Yes    Comment: occasional     Current Outpatient Medications:  .  acetaminophen (TYLENOL) 500 MG tablet, Take 500 mg by mouth every 6 (six) hours as needed., Disp: , Rfl:  .  cyclobenzaprine (FLEXERIL) 5 MG tablet, Take 1 tablet (5 mg total) by mouth  3 (three) times daily as needed for muscle spasms., Disp: 60 tablet, Rfl: 1 .  escitalopram (LEXAPRO) 10 MG tablet, Take 1 tablet (10 mg total) by mouth daily., Disp: 30 tablet, Rfl: 11 .  ferrous sulfate 325 (65 FE) MG EC tablet, Take 1 tablet (325 mg total) by mouth 2 (two) times daily with a meal., Disp: 60 tablet, Rfl: 3 .  hydrochlorothiazide (HYDRODIURIL) 12.5 MG tablet, Take 1 tablet (12.5 mg total) by mouth daily., Disp: 90 tablet, Rfl: 3 .  Magnesium 200 MG TABS, Take 1 tablet (200 mg total) by mouth daily., Disp: 30  each, Rfl: 2 .  medroxyPROGESTERone (PROVERA) 5 MG tablet, Take 1 tablet (5 mg total) by mouth 3 (three) times daily. After 1 week decrease to 2 times a day for 7 days, then 1 time a day for 7 days, Disp: 42 tablet, Rfl: 0 .  Melatonin 10 MG TABS, Take by mouth., Disp: , Rfl:  .  rOPINIRole (REQUIP) 1 MG tablet, Take 1 tablet (1 mg total) by mouth at bedtime., Disp: 30 tablet, Rfl: 2  EXAM:  GENERAL: alert, oriented, appears well and in no acute distress  HEENT: atraumatic, conjunttiva clear, no obvious abnormalities on inspection of external nose and ears  NECK: normal movements of the head and neck  LUNGS: on inspection no signs of respiratory distress, breathing rate appears normal, no obvious gross SOB, gasping or wheezing  CV: no obvious cyanosis  MS: moves all visible extremities without noticeable abnormality.  Can bend side to side, twist side to side and lean forward and backward without any obvious issues.  PSYCH/NEURO: pleasant and cooperative, no obvious depression or anxiety, speech and thought processing grossly intact  ASSESSMENT AND PLAN:  Discussed the following assessment and plan:  Restless leg syndrome-she will continue Requip, this medication is working wonderfully for her RLS.  Lower extremity swelling-she will continue on low-dose hydrochlorothiazide and wear tolerate compression style socks in the colder months to help combat swelling.  She will also elevate legs whenever able.  Acute right-sided low back pain-suspect patient strained her overused muscles and right low back related to the type of work she does for her business.  She will do oral steroid taper and use muscle relaxer as needed.  Also discussed topical rubs like BenGay or Biofreeze and doing stretches throughout the day to help improve pain and stretch back muscles.  Advised if pain persist longer than 2 weeks to let us know and at that point we can consider imaging and or physical therapy referral.     I discussed the assessment and treatment plan with the patient. The patient was provided an opportunity to ask questions and all were answered. The patient agreed with the plan and demonstrated an understanding of the instructions.   The patient was advised to call back or seek an in-person evaluation if the symptoms worsen or if the condition fails to improve as anticipated.   [name], FNP

## 2018-12-07 ENCOUNTER — Encounter: Payer: Self-pay | Admitting: Obstetrics and Gynecology

## 2018-12-29 ENCOUNTER — Telehealth: Payer: Self-pay | Admitting: Oncology

## 2018-12-29 NOTE — Telephone Encounter (Signed)
Left pt Vm to contact office to r\s missed appt on 6/18.

## 2019-01-05 ENCOUNTER — Other Ambulatory Visit: Payer: Self-pay | Admitting: Lab

## 2019-01-05 DIAGNOSIS — G2581 Restless legs syndrome: Secondary | ICD-10-CM

## 2019-01-05 MED ORDER — ROPINIROLE HCL 1 MG PO TABS: 1.0000 mg | ORAL_TABLET | Freq: Every day | ORAL | 2 refills | Status: DC

## 2019-01-06 ENCOUNTER — Other Ambulatory Visit: Payer: Self-pay | Admitting: Lab

## 2019-01-06 DIAGNOSIS — G2581 Restless legs syndrome: Secondary | ICD-10-CM

## 2019-01-10 ENCOUNTER — Other Ambulatory Visit: Payer: Self-pay | Admitting: Lab

## 2019-01-10 DIAGNOSIS — G2581 Restless legs syndrome: Secondary | ICD-10-CM

## 2019-01-10 MED ORDER — ROPINIROLE HCL 1 MG PO TABS: 1.0000 mg | ORAL_TABLET | Freq: Every day | ORAL | 2 refills | Status: DC

## 2019-01-11 ENCOUNTER — Other Ambulatory Visit: Payer: Self-pay | Admitting: Lab

## 2019-01-11 DIAGNOSIS — G2581 Restless legs syndrome: Secondary | ICD-10-CM

## 2019-01-11 MED ORDER — ROPINIROLE HCL 1 MG PO TABS: 1.0000 mg | ORAL_TABLET | Freq: Every day | ORAL | 2 refills | Status: DC

## 2019-01-12 ENCOUNTER — Other Ambulatory Visit: Payer: Self-pay | Admitting: Lab

## 2019-01-31 ENCOUNTER — Other Ambulatory Visit: Payer: Self-pay | Admitting: Lab

## 2019-01-31 ENCOUNTER — Encounter: Payer: Self-pay | Admitting: Family Medicine

## 2019-01-31 DIAGNOSIS — M545 Low back pain, unspecified: Secondary | ICD-10-CM

## 2019-01-31 MED ORDER — CYCLOBENZAPRINE HCL 5 MG PO TABS: 5.0000 mg | ORAL_TABLET | Freq: Three times a day (TID) | ORAL | 1 refills | Status: DC | PRN

## 2019-01-31 NOTE — Telephone Encounter (Signed)
Can I get a refill on the cyclobenzaprine?  If you could call it in to the [facility] in [place], please?

## 2019-01-31 NOTE — Telephone Encounter (Signed)
Can I get a refill on the cyclobenzaprine?  If you could call it in to the [facility] in [place], please?

## 2019-02-03 ENCOUNTER — Other Ambulatory Visit: Payer: Self-pay | Admitting: Lab

## 2019-02-03 DIAGNOSIS — G2581 Restless legs syndrome: Secondary | ICD-10-CM

## 2019-02-04 ENCOUNTER — Other Ambulatory Visit: Payer: Self-pay | Admitting: Lab

## 2019-02-07 ENCOUNTER — Other Ambulatory Visit: Payer: Self-pay | Admitting: Lab

## 2019-02-07 DIAGNOSIS — G2581 Restless legs syndrome: Secondary | ICD-10-CM

## 2019-02-07 MED ORDER — ROPINIROLE HCL 1 MG PO TABS: 1.0000 mg | ORAL_TABLET | Freq: Every day | ORAL | 2 refills | Status: DC

## 2019-05-20 ENCOUNTER — Telehealth: Payer: Self-pay | Admitting: Family Medicine

## 2019-05-20 NOTE — Telephone Encounter (Signed)
Ok

## 2019-05-20 NOTE — Telephone Encounter (Signed)
Pt states that husband asked Dr [name] if she would take her on as a pt. Pt used to see Guse.  Can you please confirm that so we can get her scheduled?

## 2019-05-20 NOTE — Telephone Encounter (Signed)
Pt called office Pt states that husband asked Dr [name] if she would take her on as a pt. Pt used to see Guse.  Can you please confirm that so we can get her scheduled?

## 2019-05-23 NOTE — Telephone Encounter (Signed)
Pt called and scheduled

## 2019-05-31 ENCOUNTER — Other Ambulatory Visit: Payer: Self-pay

## 2019-05-31 ENCOUNTER — Ambulatory Visit (INDEPENDENT_AMBULATORY_CARE_PROVIDER_SITE_OTHER): Payer: PRIVATE HEALTH INSURANCE | Admitting: Internal Medicine

## 2019-05-31 DIAGNOSIS — D509 Iron deficiency anemia, unspecified: Secondary | ICD-10-CM | POA: Diagnosis not present

## 2019-05-31 DIAGNOSIS — G2581 Restless legs syndrome: Secondary | ICD-10-CM

## 2019-05-31 DIAGNOSIS — K219 Gastro-esophageal reflux disease without esophagitis: Secondary | ICD-10-CM

## 2019-05-31 DIAGNOSIS — N926 Irregular menstruation, unspecified: Secondary | ICD-10-CM | POA: Diagnosis not present

## 2019-05-31 DIAGNOSIS — R61 Generalized hyperhidrosis: Secondary | ICD-10-CM

## 2019-05-31 DIAGNOSIS — Z1211 Encounter for screening for malignant neoplasm of colon: Secondary | ICD-10-CM

## 2019-05-31 NOTE — Progress Notes (Signed)
Patient ID: Brianna [name], female   DOB: 1970/09/15, 49 y.o.   MRN: [id number]   Virtual Visit via video Note  This visit type was conducted due to national recommendations for restrictions regarding the COVID-19 pandemic (e.g. social distancing).  This format is felt to be most appropriate for this patient at this time.  All issues noted in this document were discussed and addressed.  No physical exam was performed (except for noted visual exam findings with Video Visits).   I connected with Brianna [name] by a video enabled telemedicine application and verified that I am speaking with the correct person using two identifiers. Location patient: home Location provider: work Persons participating in the virtual visit: patient, provider  The limitations, risks, security and privacy concerns of performing an evaluation and management service by video and the availability of in person appointments have been discussed.  The patient expressed understanding and agreed to proceed.   Reason for visit: establish care   HPI: Previously saw Lauren.  Was having restless legs.  History of iron deficiency.  Received IV iron infusions.  No problems with restless legs now.  On no medication.  Stays active.  No chest pain or sob reported.  Some night sweats and heart racing.  Noticed over the last month.  Is able to fall asleep fast.  No headache now  Sees gyn - Dr Gilman Schmidt.  Minimal spotting.  No heavy periods now.  Has noticed some acid reflux.  Some nausea.  Occasionally will notice food not going down.  Discussed treatment.  Discussed taking pepcid on a regular basis.  Quit smoking 5 years ago.  Has a glass of wine q hs.  Has a history of post partum depression.  Previously treated with zoloft.  Doing well now.  S/p cholecystectomy.     ROS: See pertinent positives and negatives per HPI.  Past Medical History:  Diagnosis Date  . Anemia   . Chicken pox   . Depression   . Iron deficiency anemia 06/17/2018  .  Jaundice due to hepatitis   . Restless leg syndrome     Past Surgical History:  Procedure Laterality Date  . CESAREAN SECTION  [id number]  . CHOLECYSTECTOMY  2003    Family History  Problem Relation Age of Onset  . Early death Mother   . Heart disease Mother   . Hyperlipidemia Mother   . Hypertension Mother   . Alcohol abuse Father   . Arthritis Father   . COPD Father   . Hyperlipidemia Father   . Hypertension Father   . Kidney disease Father   . Arthritis Maternal Grandmother   . Stroke Maternal Grandmother   . Hyperlipidemia Maternal Grandfather   . Breast cancer Paternal Aunt 76    SOCIAL HX: reviewed.   No current outpatient medications on file.  EXAM:  GENERAL: alert, oriented, appears well and in no acute distress  HEENT: atraumatic, conjunttiva clear, no obvious abnormalities on inspection of external nose and ears  NECK: normal movements of the head and neck  LUNGS: on inspection no signs of respiratory distress, breathing rate appears normal, no obvious gross SOB, gasping or wheezing  CV: no obvious cyanosis  PSYCH/NEURO: pleasant and cooperative, no obvious depression or anxiety, speech and thought processing grossly intact  ASSESSMENT AND PLAN:  Discussed the following assessment and plan:  Iron deficiency anemia History of iron deficient anemia.  Has seen hematology previously.  Has received IV iron infusion.  Recheck cbc and ferritin.  Restless leg Not an issue now.  On no medication.    GERD (gastroesophageal reflux disease) Acid reflux as outlined.  Some question - dysphagia.  Start pepcid on a daily basis.  Schedule f/u soon to reassess.  If persistent symptoms, will need GI evaluation.   Menstrual changes Sees gyn.  Minimal spotting.  Follow.   Colon cancer screening Discussed new guidelines - colon screening - age 35.   Sweating increase Some night sweats.  Some increased heart racing.  Treat acid reflux.  Check routine labs.       I discussed the assessment and treatment plan with the patient. The patient was provided an opportunity to ask questions and all were answered. The patient agreed with the plan and demonstrated an understanding of the instructions.   The patient was advised to call back or seek an in-person evaluation if the symptoms worsen or if the condition fails to improve as anticipated.   Einar Pheasant, MD

## 2019-06-01 ENCOUNTER — Other Ambulatory Visit (INDEPENDENT_AMBULATORY_CARE_PROVIDER_SITE_OTHER): Payer: PRIVATE HEALTH INSURANCE

## 2019-06-01 ENCOUNTER — Telehealth: Payer: Self-pay | Admitting: *Deleted

## 2019-06-01 DIAGNOSIS — E538 Deficiency of other specified B group vitamins: Secondary | ICD-10-CM

## 2019-06-01 DIAGNOSIS — D509 Iron deficiency anemia, unspecified: Secondary | ICD-10-CM

## 2019-06-01 DIAGNOSIS — Z13220 Encounter for screening for lipoid disorders: Secondary | ICD-10-CM

## 2019-06-01 LAB — COMPREHENSIVE METABOLIC PANEL
ALT: 21 U/L (ref 0–35)
AST: 20 U/L (ref 0–37)
Albumin: 4.1 g/dL (ref 3.5–5.2)
Alkaline Phosphatase: 64 U/L (ref 39–117)
BUN: 10 mg/dL (ref 6–23)
CO2: 30 mEq/L (ref 19–32)
Calcium: 8.9 mg/dL (ref 8.4–10.5)
Chloride: 103 mEq/L (ref 96–112)
Creatinine, Ser: 0.73 mg/dL (ref 0.40–1.20)
GFR: 84.76 mL/min (ref >60.00)
Glucose, Bld: 91 mg/dL (ref 70–99)
Potassium: 4.2 mEq/L (ref 3.5–5.1)
Sodium: 139 mEq/L (ref 135–145)
Total Bilirubin: 0.5 mg/dL (ref 0.2–1.2)
Total Protein: 7 g/dL (ref 6.0–8.3)

## 2019-06-01 LAB — CBC WITH DIFFERENTIAL/PLATELET
Basophils Absolute: 0 10*3/uL (ref 0.0–0.1)
Basophils Relative: 0.7 % (ref 0.0–3.0)
Eosinophils Absolute: 0.1 10*3/uL (ref 0.0–0.7)
Eosinophils Relative: 1.8 % (ref 0.0–5.0)
HCT: 42.8 % (ref 36.0–46.0)
Hemoglobin: 14 g/dL (ref 12.0–15.0)
Lymphocytes Relative: 24 % (ref 12.0–46.0)
Lymphs Abs: 1.3 10*3/uL (ref 0.7–4.0)
MCHC: 32.8 g/dL (ref 30.0–36.0)
MCV: 90.9 fl (ref 78.0–100.0)
Monocytes Absolute: 0.4 10*3/uL (ref 0.1–1.0)
Monocytes Relative: 7.9 % (ref 3.0–12.0)
Neutro Abs: 3.5 10*3/uL (ref 1.4–7.7)
Neutrophils Relative %: 65.6 % (ref 43.0–77.0)
Platelets: 240 10*3/uL (ref 150.0–400.0)
RBC: 4.71 Mil/uL (ref 3.87–5.11)
RDW: 14.8 % (ref 11.5–15.5)
WBC: 5.3 10*3/uL (ref 4.0–10.5)

## 2019-06-01 LAB — LIPID PANEL
Cholesterol: 226 mg/dL — ABNORMAL HIGH (ref 0–200)
HDL: 59.8 mg/dL (ref >39.00)
LDL Cholesterol: 126 mg/dL — ABNORMAL HIGH (ref 0–99)
NonHDL: 166.01
Total CHOL/HDL Ratio: 4
Triglycerides: 199 mg/dL — ABNORMAL HIGH (ref 0.0–149.0)
VLDL: 39.8 mg/dL (ref 0.0–40.0)

## 2019-06-01 LAB — IBC + FERRITIN
Ferritin: 11 ng/mL (ref 10.0–291.0)
Iron: 52 ug/dL (ref 42–145)
Saturation Ratios: 12.3 % — ABNORMAL LOW (ref 20.0–50.0)
Transferrin: 301 mg/dL (ref 212.0–360.0)

## 2019-06-01 LAB — TSH: TSH: 3.46 u[IU]/mL (ref 0.35–4.50)

## 2019-06-01 NOTE — Telephone Encounter (Signed)
Lab orders in.  Thanks

## 2019-06-01 NOTE — Telephone Encounter (Signed)
Please place future orders for lab appt.   Pt was added to the schedule late yesterday afternoon. Has a 10am lab appt this morning.

## 2019-06-02 LAB — FOLATE RBC: RBC Folate: 692 ng/mL RBC (ref >280)

## 2019-06-05 ENCOUNTER — Encounter: Payer: Self-pay | Admitting: Internal Medicine

## 2019-06-05 DIAGNOSIS — R61 Generalized hyperhidrosis: Secondary | ICD-10-CM | POA: Insufficient documentation

## 2019-06-05 DIAGNOSIS — Z1211 Encounter for screening for malignant neoplasm of colon: Secondary | ICD-10-CM | POA: Insufficient documentation

## 2019-06-05 DIAGNOSIS — N926 Irregular menstruation, unspecified: Secondary | ICD-10-CM | POA: Insufficient documentation

## 2019-06-05 DIAGNOSIS — K219 Gastro-esophageal reflux disease without esophagitis: Secondary | ICD-10-CM | POA: Insufficient documentation

## 2019-06-05 NOTE — Assessment & Plan Note (Signed)
Some night sweats.  Some increased heart racing.  Treat acid reflux.  Check routine labs.

## 2019-06-05 NOTE — Assessment & Plan Note (Signed)
Acid reflux as outlined.  Some question - dysphagia.  Start pepcid on a daily basis.  Schedule f/u soon to reassess.  If persistent symptoms, will need GI evaluation.

## 2019-06-05 NOTE — Assessment & Plan Note (Signed)
History of iron deficient anemia.  Has seen hematology previously.  Has received IV iron infusion.  Recheck cbc and ferritin.

## 2019-06-05 NOTE — Assessment & Plan Note (Signed)
Sees gyn.  Minimal spotting.  Follow.

## 2019-06-05 NOTE — Assessment & Plan Note (Signed)
Discussed new guidelines - colon screening - age 49.

## 2019-06-05 NOTE — Assessment & Plan Note (Signed)
Not an issue now.  On no medication.

## 2019-06-07 ENCOUNTER — Other Ambulatory Visit: Payer: Self-pay | Admitting: Internal Medicine

## 2019-06-07 DIAGNOSIS — Z1211 Encounter for screening for malignant neoplasm of colon: Secondary | ICD-10-CM

## 2019-06-07 DIAGNOSIS — D509 Iron deficiency anemia, unspecified: Secondary | ICD-10-CM

## 2019-06-07 NOTE — Progress Notes (Signed)
Order placed for GI referral.   

## 2019-06-09 ENCOUNTER — Ambulatory Visit: Payer: Self-pay | Admitting: Family Medicine

## 2019-06-29 ENCOUNTER — Encounter: Payer: Self-pay | Admitting: Internal Medicine

## 2019-06-29 NOTE — Telephone Encounter (Signed)
See other note

## 2019-07-04 ENCOUNTER — Encounter: Payer: Self-pay | Admitting: Gastroenterology

## 2019-07-05 ENCOUNTER — Encounter: Payer: Self-pay | Admitting: Gastroenterology

## 2019-07-05 ENCOUNTER — Ambulatory Visit (INDEPENDENT_AMBULATORY_CARE_PROVIDER_SITE_OTHER): Payer: PRIVATE HEALTH INSURANCE | Admitting: Gastroenterology

## 2019-07-05 ENCOUNTER — Other Ambulatory Visit: Payer: Self-pay

## 2019-07-05 DIAGNOSIS — R1310 Dysphagia, unspecified: Secondary | ICD-10-CM

## 2019-07-05 DIAGNOSIS — D509 Iron deficiency anemia, unspecified: Secondary | ICD-10-CM

## 2019-07-05 DIAGNOSIS — Z1211 Encounter for screening for malignant neoplasm of colon: Secondary | ICD-10-CM

## 2019-07-05 DIAGNOSIS — K219 Gastro-esophageal reflux disease without esophagitis: Secondary | ICD-10-CM | POA: Diagnosis not present

## 2019-07-05 MED ORDER — NA SULFATE-K SULFATE-MG SULF 17.5-3.13-1.6 GM/177ML PO SOLN: 354.0000 mL | Freq: Once | ORAL | 0 refills | Status: AC

## 2019-07-05 NOTE — Patient Instructions (Addendum)
Dysphagia Eating Plan, Bite Size Food This diet plan is for people with moderate swallowing problems who have transitioned from pureed and minced foods. Bite size foods are soft and cut into small chunks so that they can be swallowed safely. On this eating plan, you may be instructed to drink liquids that are thickened. Work with your health care provider and your diet and nutrition specialist (dietitian) to make sure that you are following the diet safely and getting all the nutrients you need. What are tips for following this plan? General guidelines for foods   You may eat foods that are tender, soft, and moist.  Always test food texture before taking a bite. Poke food with a fork or spoon to make sure it is tender.  Food should be easy to cut and shew. Avoid large pieces of food that require a lot of chewing.  Take small bites. Each bite should be smaller than your thumb nail (about 20m by 15 mm).  If you were on pureed and minced food diet plans, you may eat any of the foods included in those diets.  Avoid foods that are very dry, hard, sticky, chewy, coarse, or crunchy.  If instructed by your health care provider, thicken liquids. Follow your health care provider's instructions for what products to use, how to do this, and to what thickness. ? Your health care provider may recommend using a commercial thickener, rice cereal, or potato flakes. Ask your health care provider to recommend thickeners. ? Thickened liquids are usually a "pudding-like" consistency, or they may be as thick as honey or thick enough to eat with a spoon. Cooking  To moisten foods, you may add liquids while you are blending, mashing, or grinding your foods to the right consistency. These liquids include gravies, sauces, vegetable or fruit juice, milk, half and half, or water.  Strain extra liquid from foods before eating.  Reheat foods slowly to prevent a tough crust from forming.  Prepare foods in  advance. Meal planning  Eat a variety of foods to get all the nutrients you need.  Some foods may be tolerated better than others. Work with your dietitian to identify which foods are safest for you to eat.  Follow your meal plan as told by your dietitian. What foods are allowed? Grains Moist breads without nuts or seeds. Biscuits, muffins, pancakes, and waffles that are well-moistened with syrup, jelly, margarine, or butter. Cooked cereals. Moist bread stuffing. Moist rice. Well-moistened cold cereal with small chunks. Well-cooked pasta, noodles, rice, and bread dressing in small pieces and thick sauce. Soft dumplings or spaetzle in small pieces and butter or gravy. Vegetables Soft, well-cooked vegetables in small pieces. Soft-cooked, mashed potatoes. Thickened vegetable juice. Fruits Canned or cooked fruits that are soft or moist and do not have skin or seeds. Fresh, soft bananas. Thickened fruit juices. Meat and other protein foods Tender, moist meats or poultry in small pieces. Moist meatballs or meatloaf. Fish without bones. Eggs or egg substitutes in small pieces. Tofu. Tempeh and meat alternatives in small pieces. Well-cooked, tender beans, peas, baked beans, and other legumes. Dairy Thickened milk. Cream cheese. Yogurt. Cottage cheese. Sour cream. Small pieces of soft cheese. Fats and oils Butter. Oils. Margarine. Mayonnaise. Gravy. Spreads. Sweets and desserts Soft, smooth, moist desserts. Pudding. Custard. Moist cakes. Jam. Jelly. Honey. Preserves. Ask your health care provider whether you can have frozen desserts. Seasoning and other foods All seasonings and sweeteners. All sauces with small chunks. Prepared tuna, egg, or chicken  salad without raw fruits or vegetables. Moist casseroles with small, tender pieces of meat. Soups with tender meat. What foods are not allowed? Grains Coarse or dry cereals. Dry breads. Toast. Crackers. Tough, crusty breads, such as [place] bread and  baguettes. Dry pancakes, waffles, and muffins. Sticky rice. Dry bread stuffing. Granola. Popcorn. Chips. Vegetables All raw vegetables. Cooked corn. Rubbery or stiff cooked vegetables. Stringy vegetables, such as celery. Tough, crisp fried potatoes. Potato skins. Fruits Hard, crunchy, stringy, high-pulp, and juicy raw fruits such as apples, pineapple, papaya, and watermelon. Small, round fruits, such as grapes. Dried fruit and fruit leather. Meat and other protein foods Large pieces of meat. Dry, tough meats, such as bacon, sausage, and hot dogs. Chicken, [place], or fish with skin and bones. Crunchy peanut butter. Nuts. Seeds. Nut and seed butters. Dairy Yogurt with nuts, seeds, or large chunks. Large chunks of cheese. Frozen desserts and milk consistency not allowed by your dietitian. Sweets and desserts Dry cakes. Chewy or dry cookies. Any desserts with nuts, seeds, dry fruits, coconut, pineapple, or anything dry, sticky, or hard. Chewy caramel. Licorice. Taffy-type candies. Ask your health care provider whether you can have frozen desserts. Seasoning and other foods Soups with tough or large chunks of meats, poultry, or vegetables. Corn or clam chowder. Smoothies with large chunks of fruit. Summary  Bite size foods can be helpful for people with moderate swallowing problems.  On the dysphagia eating plan, you may eat foods that are soft, moist, and cut into pieces smaller than 66mm by 43mm.  You may be instructed to thicken liquids. Follow your health care provider's instructions about how to do this and to what consistency. This information is not intended to replace advice given to you by your health care provider. Make sure you discuss any questions you have with your health care provider. Document Revised: 07/29/2018 Document Reviewed: 07/18/2016 Elsevier Patient Education  [place].  Dysphagia  Dysphagia is trouble swallowing. This condition occurs when solids and liquids  stick in a person's throat on the way down to the stomach, or when food takes longer to get to the stomach than usual. You may have problems swallowing food, liquids, or both. You may also have pain while trying to swallow. It may take you more time and effort to swallow something. What are the causes? This condition may be caused by:  Muscle problems. They may make it difficult for you to move food and liquids through the esophagus, which is the tube that connects your mouth to your stomach.  Blockages. You may have ulcers, scar tissue, or inflammation that blocks the normal passage of food and liquids. Causes of these problems include: ? Acid reflux from your stomach into your esophagus (gastroesophageal reflux). ? Infections. ? Radiation treatment for cancer. ? Medicines taken without enough fluids to wash them down into your stomach.  Stroke. This can affect the nerves and make it difficult to swallow.  Nerve problems. These prevent signals from being sent to the muscles of your esophagus to squeeze (contract) and move what you swallow down to your stomach.  Globus pharyngeus. This is a common problem that involves a feeling like something is stuck in your throat or a sense of trouble with swallowing, even though nothing is wrong with the swallowing passages.  Certain conditions, such as cerebral palsy or Parkinson's disease. What are the signs or symptoms? Common symptoms of this condition include:  A feeling that solids or liquids are stuck in your throat  on the way down to the stomach.  Pain while swallowing.  Coughing or gagging while trying to swallow. Other symptoms include:  Food moving back from your stomach to your mouth (regurgitation).  Noises coming from your throat.  Chest discomfort with swallowing.  A feeling of fullness when swallowing.  Drooling, especially when the throat is blocked.  Heartburn. How is this diagnosed? This condition may be diagnosed  by:  Barium X-ray. In this test, you will swallow a white liquid that sticks to the inside of your esophagus. X-ray images are then taken.  Endoscopy. In this test, a flexible telescope is inserted down your throat to look at your esophagus and your stomach.  CT scans and an MRI. How is this treated? Treatment for dysphagia depends on the cause of this condition, such as:  If the dysphagia is caused by acid reflux or infection, medicines may be used. They may include antibiotics and heartburn medicines.  If the dysphagia is caused by problems with the muscles, swallowing therapy may be used to help you strengthen your swallowing muscles. You may have to do specific exercises to strengthen the muscles or stretch them.  If the dysphagia is caused by a blockage or mass, procedures to remove the blockage may be done. You may need surgery and a feeding tube. You may need to make diet changes. Ask your health care provider for specific instructions. Follow these instructions at home: Medicines  Take over-the-counter and prescription medicines only as told by your health care provider.  If you were prescribed an antibiotic medicine, take it as told by your health care provider. Do not stop taking the antibiotic even if you start to feel better. Eating and drinking   Follow any diet changes as told by your health care provider.  Work with a diet and nutrition specialist (dietitian) to create an eating plan that will help you get the nutrients you need in order to stay healthy.  Eat soft foods that are easier to swallow.  Cut your food into small pieces and eat slowly. Take small bites.  Eat and drink only when you are sitting upright.  Do not drink alcohol or caffeine. If you need help quitting, ask your health care provider. General instructions  Check your weight every day to make sure you are not losing weight.  Do not use any products that contain nicotine or tobacco, such as  cigarettes, e-cigarettes, and chewing tobacco. If you need help quitting, ask your health care provider.  Keep all follow-up visits as told by your health care provider. This is important. Contact a health care provider if you:  Lose weight because you cannot swallow.  Cough when you drink liquids.  Cough up partially digested food. Get help right away if you:  Cannot swallow your saliva.  Have shortness of breath, a fever, or both.  Have a hoarse voice and also have trouble swallowing. Summary  Dysphagia is trouble swallowing. This condition occurs when solids and liquids stick in a person's throat on the way down to the stomach. You may cough or gag while trying to swallow.  Dysphagia has many possible causes.  Treatment for dysphagia depends on the cause of the condition.  Keep all follow-up visits as told by your health care provider. This is important. This information is not intended to replace advice given to you by your health care provider. Make sure you discuss any questions you have with your health care provider. Document Revised: 09/01/2018 Document Reviewed:  09/01/2018 Elsevier Patient Education  2020 [place].  Gastroesophageal Reflux Disease, Adult Gastroesophageal reflux (GER) happens when acid from the stomach flows up into the tube that connects the mouth and the stomach (esophagus). Normally, food travels down the esophagus and stays in the stomach to be digested. With GER, food and stomach acid sometimes move back up into the esophagus. You may have a disease called gastroesophageal reflux disease (GERD) if the reflux:  Happens often.  Causes frequent or very bad symptoms.  Causes problems such as damage to the esophagus. When this happens, the esophagus becomes sore and swollen (inflamed). Over time, GERD can make small holes (ulcers) in the lining of the esophagus. What are the causes? This condition is caused by a problem with the muscle between  the esophagus and the stomach. When this muscle is weak or not normal, it does not close properly to keep food and acid from coming back up from the stomach. The muscle can be weak because of:  Tobacco use.  Pregnancy.  Having a certain type of hernia (hiatal hernia).  Alcohol use.  Certain foods and drinks, such as coffee, chocolate, onions, and peppermint. What increases the risk? You are more likely to develop this condition if you:  Are overweight.  Have a disease that affects your connective tissue.  Use NSAID medicines. What are the signs or symptoms? Symptoms of this condition include:  Heartburn.  Difficult or painful swallowing.  The feeling of having a lump in the throat.  A bitter taste in the mouth.  Bad breath.  Having a lot of saliva.  Having an upset or bloated stomach.  Belching.  Chest pain. Different conditions can cause chest pain. Make sure you see your doctor if you have chest pain.  Shortness of breath or noisy breathing (wheezing).  Ongoing (chronic) cough or a cough at night.  Wearing away of the surface of teeth (tooth enamel).  Weight loss. How is this treated? Treatment will depend on how bad your symptoms are. Your doctor may suggest:  Changes to your diet.  Medicine.  Surgery. Follow these instructions at home: Eating and drinking   Follow a diet as told by your doctor. You may need to avoid foods and drinks such as: ? Coffee and tea (with or without caffeine). ? Drinks that contain alcohol. ? Energy drinks and sports drinks. ? Bubbly (carbonated) drinks or sodas. ? Chocolate and cocoa. ? Peppermint and mint flavorings. ? Garlic and onions. ? Horseradish. ? Spicy and acidic foods. These include peppers, chili powder, curry powder, vinegar, hot sauces, and BBQ sauce. ? Citrus fruit juices and citrus fruits, such as oranges, lemons, and limes. ? Tomato-based foods. These include red sauce, chili, salsa, and pizza with  red sauce. ? Fried and fatty foods. These include donuts, french fries, potato chips, and high-fat dressings. ? High-fat meats. These include hot dogs, rib eye steak, sausage, ham, and bacon. ? High-fat dairy items, such as whole milk, butter, and cream cheese.  Eat small meals often. Avoid eating large meals.  Avoid drinking large amounts of liquid with your meals.  Avoid eating meals during the 2-3 hours before bedtime.  Avoid lying down right after you eat.  Do not exercise right after you eat. Lifestyle   Do not use any products that contain nicotine or tobacco. These include cigarettes, e-cigarettes, and chewing tobacco. If you need help quitting, ask your doctor.  Try to lower your stress. If you need help doing this, ask  your doctor.  If you are overweight, lose an amount of weight that is healthy for you. Ask your doctor about a safe weight loss goal. General instructions  Pay attention to any changes in your symptoms.  Take over-the-counter and prescription medicines only as told by your doctor. Do not take aspirin, ibuprofen, or other NSAIDs unless your doctor says it is okay.  Wear loose clothes. Do not wear anything tight around your waist.  Raise (elevate) the head of your bed about 6 inches (15 cm).  Avoid bending over if this makes your symptoms worse.  Keep all follow-up visits as told by your doctor. This is important. Contact a doctor if:  You have new symptoms.  You lose weight and you do not know why.  You have trouble swallowing or it hurts to swallow.  You have wheezing or a cough that keeps happening.  Your symptoms do not get better with treatment.  You have a hoarse voice. Get help right away if:  You have pain in your arms, neck, jaw, teeth, or back.  You feel sweaty, dizzy, or light-headed.  You have chest pain or shortness of breath.  You throw up (vomit) and your throw-up looks like blood or coffee grounds.  You pass out  (faint).  Your poop (stool) is bloody or black.  You cannot swallow, drink, or eat. Summary  If a person has gastroesophageal reflux disease (GERD), food and stomach acid move back up into the esophagus and cause symptoms or problems such as damage to the esophagus.  Treatment will depend on how bad your symptoms are.  Follow a diet as told by your doctor.  Take all medicines only as told by your doctor. This information is not intended to replace advice given to you by your health care provider. Make sure you discuss any questions you have with your health care provider. Document Revised: 10/14/2017 Document Reviewed: 10/14/2017 Elsevier Patient Education  [place].

## 2019-07-05 NOTE — Progress Notes (Signed)
Brianna Antigua, MD [address]  [place],  [address]  Main: [phone]  Fax: [phone]   Primary Care Physician: Einar Pheasant, MD  Virtual Visit via Telephone Note Due to issues with connection on video feed on patient's end, this was changed to a telephone visit  I connected with patient on 07/05/19 at 10:00 AM EDT by telephone and verified that I am speaking with the correct person using two identifiers.   I discussed the limitations, risks, security and privacy concerns of performing an evaluation and management service by telephone and the availability of in person appointments. I also discussed with the patient that there may be a patient responsible charge related to this service. The patient expressed understanding and agreed to proceed.  Location of Patient: Home Location of Provider: Home Persons involved: Patient and provider only during the visit (nursing staff and front desk staff was involved in communicating with the patient prior to the appointment, reviewing medications and checking them in)   History of Present Illness: Chief Complaint  Patient presents with  . New Patient (Initial Visit)  . Anemia    Patient has been fatigue      HPI: Brianna [name] is a 49 y.o. female with iron deficiency anemia.  Previously seen by Dr. Grayland Ormond and given iron replacement last year.  Denies any episodes of bleeding.  Used to have menorrhagia but this is resolved.  Never had an EGD or colonoscopy.  Describes intermittent dysphagia to solid foods for the last 2 to 3 years.  Occurs about 2-3 times a week.  Does have to gag herself to vomit the food back up at times.  No hematemesis.  States she chews her food well and has good teeth.  No weight loss.  No family history of colon cancer.  He is on H2 RA for acid reflux and with that has noted significant improvement in heartburn.  Only having breakthrough symptoms about once a week.  Current Outpatient  Medications  Medication Sig Dispense Refill  . Na Sulfate-K Sulfate-Mg Sulf 17.5-3.13-1.6 GM/177ML SOLN Take 354 mLs by mouth once for 1 dose. 354 mL 0   No current facility-administered medications for this visit.    Allergies as of 07/05/2019 - Review Complete 07/05/2019  Allergen Reaction Noted  . Vicodin [hydrocodone-acetaminophen] Hives 05/25/2018    Review of Systems:    All systems reviewed and negative except where noted in HPI.   Observations/Objective:  Labs: CMP     Component Value Date/Time   NA 139 06/01/2019 1045   K 4.2 06/01/2019 1045   CL 103 06/01/2019 1045   CO2 30 06/01/2019 1045   GLUCOSE 91 06/01/2019 1045   BUN 10 06/01/2019 1045   CREATININE 0.73 06/01/2019 1045   CALCIUM 8.9 06/01/2019 1045   PROT 7.0 06/01/2019 1045   ALBUMIN 4.1 06/01/2019 1045   AST 20 06/01/2019 1045   ALT 21 06/01/2019 1045   ALKPHOS 64 06/01/2019 1045   BILITOT 0.5 06/01/2019 1045   Lab Results  Component Value Date   WBC 5.3 06/01/2019   HGB 14.0 06/01/2019   HCT 42.8 06/01/2019   MCV 90.9 06/01/2019   PLT 240.0 06/01/2019    Imaging Studies: No results found.  Assessment and Plan:   Brianna [name] is a 49 y.o. y/o female with dysphagia for 2 to 3 years to solid foods, iron deficiency anemia and GERD  Assessment and Plan: EGD indicated for further evaluation of dysphagia.  Rule  out strictures, and EOE with biopsies  EGD also indicated for iron deficiency anemia  Colonoscopy indicated for screening.  And also for iron deficiency anemia  I have discussed alternative options, risks & benefits,  which include, but are not limited to, bleeding, infection, perforation,respiratory complication & drug reaction.  The patient agrees with this plan & written consent will be obtained.    Dysphagia diet discussed and handout sent for the same  Patient educated extensively on acid reflux lifestyle modification, including buying a bed wedge, not eating 3 hrs before  bedtime, diet modifications, and handout given for the same.    Follow Up Instructions: 1 to 2 weeks post procedures   I discussed the assessment and treatment plan with the patient. The patient was provided an opportunity to ask questions and all were answered. The patient agreed with the plan and demonstrated an understanding of the instructions.   The patient was advised to call back or seek an in-person evaluation if the symptoms worsen or if the condition fails to improve as anticipated.  I provided 12 minutes of non-face-to-face time during this encounter. Additional time was spent in reviewing patient's chart, placing orders etc.   Virgel Manifold, MD  Speech recognition software was used to dictate this note.

## 2019-07-12 ENCOUNTER — Encounter: Payer: Self-pay | Admitting: Internal Medicine

## 2019-07-12 ENCOUNTER — Telehealth (INDEPENDENT_AMBULATORY_CARE_PROVIDER_SITE_OTHER): Payer: PRIVATE HEALTH INSURANCE | Admitting: Internal Medicine

## 2019-07-12 DIAGNOSIS — R635 Abnormal weight gain: Secondary | ICD-10-CM | POA: Diagnosis not present

## 2019-07-12 DIAGNOSIS — I83813 Varicose veins of bilateral lower extremities with pain: Secondary | ICD-10-CM

## 2019-07-12 NOTE — Progress Notes (Signed)
Patient ID: Brianna [name], female   DOB: 11-11-1970, 49 y.o.   MRN: [id number]   Virtual Visit via video Note  This visit type was conducted due to national recommendations for restrictions regarding the COVID-19 pandemic (e.g. social distancing).  This format is felt to be most appropriate for this patient at this time.  All issues noted in this document were discussed and addressed.  No physical exam was performed (except for noted visual exam findings with Video Visits).   I connected with Brianna [name] by a video enabled telemedicine application and verified that I am speaking with the correct person using two identifiers. Location patient: home Location provider: work  Persons participating in the virtual visit: patient, provider  The limitations, risks, security and privacy concerns of performing an evaluation and management service by video and the availability of in person appointments have been discussed.  The patient expressed understanding and agreed to proceed.   Reason for visit: work in appt  HPI: Work in appt with concerns regarding varicose veins/spider veins and pain.  States she has gained approximately 30 pounds in the last year.  Works a lot.  On her feet and legs a lot.  Increased pain - legs.  Increased aching and stinging - over the veins.  No redness.  No evidence of infection.  No fever.  Discussed compression hose.     ROS: See pertinent positives and negatives per HPI.  Past Medical History:  Diagnosis Date  . Anemia   . Chicken pox   . Depression   . Iron deficiency anemia 06/17/2018  . Jaundice due to hepatitis   . Restless leg syndrome     Past Surgical History:  Procedure Laterality Date  . CESAREAN SECTION  [phone]  . CHOLECYSTECTOMY  2003    Family History  Problem Relation Age of Onset  . Early death Mother   . Heart disease Mother   . Hyperlipidemia Mother   . Hypertension Mother   . Alcohol abuse Father   . Arthritis Father   . COPD  Father   . Hyperlipidemia Father   . Hypertension Father   . Kidney disease Father   . Arthritis Maternal Grandmother   . Stroke Maternal Grandmother   . Hyperlipidemia Maternal Grandfather   . Breast cancer Paternal Aunt 39    SOCIAL HX: reviewed.   No current outpatient medications on file.  EXAM:  GENERAL: alert, oriented, appears well and in no acute distress  HEENT: atraumatic, conjunttiva clear, no obvious abnormalities on inspection of external nose and ears  NECK: normal movements of the head and neck  LUNGS: on inspection no signs of respiratory distress, breathing rate appears normal, no obvious gross SOB, gasping or wheezing  CV: no obvious cyanosis  PSYCH/NEURO: pleasant and cooperative, no obvious depression or anxiety, speech and thought processing grossly intact  ASSESSMENT AND PLAN:  Discussed the following assessment and plan:  Weight gain Concerned regarding weight gain.  Discussed diet and exercise.  Will send [facility] Lipid diet and low carb diet information.  Follow.    Varicose veins of leg with pain, bilateral Increased pain in legs - aching and stinging.  Affecting quality of life.  Discussed compression hose and leg elevation.  She has tried compression hose previously.  Increased problems now.  Request referral to vascular surgery.     Orders Placed This Encounter  Procedures  . Ambulatory referral to Vascular Surgery    Referral Priority:   Routine    Referral  Type:   Surgical    Referral Reason:   Specialty Services Required    Requested Specialty:   Vascular Surgery    Number of Visits Requested:   1     I discussed the assessment and treatment plan with the patient. The patient was provided an opportunity to ask questions and all were answered. The patient agreed with the plan and demonstrated an understanding of the instructions.   The patient was advised to call back or seek an in-person evaluation if the symptoms worsen or if the  condition fails to improve as anticipated.    Einar Pheasant, MD

## 2019-07-12 NOTE — Telephone Encounter (Signed)
Pt seen and evaluated.  Being referred to [facility].

## 2019-07-17 ENCOUNTER — Encounter: Payer: Self-pay | Admitting: Internal Medicine

## 2019-07-17 DIAGNOSIS — R635 Abnormal weight gain: Secondary | ICD-10-CM | POA: Insufficient documentation

## 2019-07-17 DIAGNOSIS — I83813 Varicose veins of bilateral lower extremities with pain: Secondary | ICD-10-CM | POA: Insufficient documentation

## 2019-07-17 NOTE — Assessment & Plan Note (Signed)
Concerned regarding weight gain.  Discussed diet and exercise.  Will send [facility] Lipid diet and low carb diet information.  Follow.

## 2019-07-17 NOTE — Assessment & Plan Note (Signed)
Increased pain in legs - aching and stinging.  Affecting quality of life.  Discussed compression hose and leg elevation.  She has tried compression hose previously.  Increased problems now.  Request referral to vascular surgery.

## 2019-07-26 ENCOUNTER — Telehealth (INDEPENDENT_AMBULATORY_CARE_PROVIDER_SITE_OTHER): Payer: PRIVATE HEALTH INSURANCE | Admitting: Internal Medicine

## 2019-07-26 DIAGNOSIS — K219 Gastro-esophageal reflux disease without esophagitis: Secondary | ICD-10-CM

## 2019-07-26 DIAGNOSIS — D509 Iron deficiency anemia, unspecified: Secondary | ICD-10-CM

## 2019-07-26 DIAGNOSIS — I83813 Varicose veins of bilateral lower extremities with pain: Secondary | ICD-10-CM

## 2019-07-26 DIAGNOSIS — Z1211 Encounter for screening for malignant neoplasm of colon: Secondary | ICD-10-CM | POA: Diagnosis not present

## 2019-07-26 NOTE — Progress Notes (Signed)
Patient ID: Brianna [name], female   DOB: 01/15/1971, 49 y.o.   MRN: [id number]   Virtual Visit via video Note  This visit type was conducted due to national recommendations for restrictions regarding the COVID-19 pandemic (e.g. social distancing).  This format is felt to be most appropriate for this patient at this time.  All issues noted in this document were discussed and addressed.  No physical exam was performed (except for noted visual exam findings with Video Visits).   I connected with Brianna [name] today by a video enabled telemedicine application and verified that I am speaking with the correct person using two identifiers. Location patient: home Location provider: work  Persons participating in the virtual visit: patient, provider  The limitations, risks, security and privacy concerns of performing an evaluation and management service by video and the availability of in person appointments have been discussed.  The patient expressed understanding and agreed to proceed.   Reason for visit: scheduled follow up.   HPI: Has been on vacation this past week. Stress is better currently.  Feeling better.  No chest pain or tightness reported.  Breathing stable.  No abdominal pain or bowel change reported.  Has been followed by hematology for IDA. Saw GI recently.  Intermittent dysphagia.  Acid reflux - pepcid.  Planning for EGD and colonoscopy in 08/2019.  Planning to see [facility] 08/01/19 - for her legs.  See last note.  Sees gyn for pelvic exams.     ROS: See pertinent positives and negatives per HPI.  Past Medical History:  Diagnosis Date  . Anemia   . Chicken pox   . Depression   . Iron deficiency anemia 06/17/2018  . Jaundice due to hepatitis   . Restless leg syndrome     Past Surgical History:  Procedure Laterality Date  . CESAREAN SECTION  [phone]  . CHOLECYSTECTOMY  2003    Family History  Problem Relation Age of Onset  . Early death Mother   . Heart disease Mother   .  Hyperlipidemia Mother   . Hypertension Mother   . Alcohol abuse Father   . Arthritis Father   . COPD Father   . Hyperlipidemia Father   . Hypertension Father   . Kidney disease Father   . Arthritis Maternal Grandmother   . Stroke Maternal Grandmother   . Hyperlipidemia Maternal Grandfather   . Breast cancer Paternal Aunt 21    SOCIAL HX: reviewed.   No current outpatient medications on file.  EXAM:  GENERAL: alert, oriented, appears well and in no acute distress  HEENT: atraumatic, conjunttiva clear, no obvious abnormalities on inspection of external nose and ears  NECK: normal movements of the head and neck  LUNGS: on inspection no signs of respiratory distress, breathing rate appears normal, no obvious gross SOB, gasping or wheezing  CV: no obvious cyanosis  PSYCH/NEURO: pleasant and cooperative, no obvious depression or anxiety, speech and thought processing grossly intact  ASSESSMENT AND PLAN:  Discussed the following assessment and plan:  Colon cancer screening Saw GI. Planning for colonoscopy in 08/2019.    GERD (gastroesophageal reflux disease) Pepcid.  Saw GI.  Some dysphagia as outlined.  Planning for EGD 08/2019.    Iron deficiency anemia History of IDA.  Has seen hematology.  Follow cbc and ferritin.    Varicose veins of leg with pain, bilateral Increased pain as outlined in last note.  Better this week - on vacation.  Compression hose.  Has f/u with [facility] next week.  I discussed the assessment and treatment plan with the patient. The patient was provided an opportunity to ask questions and all were answered. The patient agreed with the plan and demonstrated an understanding of the instructions.   The patient was advised to call back or seek an in-person evaluation if the symptoms worsen or if the condition fails to improve as anticipated.    Einar Pheasant, MD

## 2019-07-27 ENCOUNTER — Ambulatory Visit: Payer: PRIVATE HEALTH INSURANCE | Admitting: Gastroenterology

## 2019-07-28 ENCOUNTER — Other Ambulatory Visit (INDEPENDENT_AMBULATORY_CARE_PROVIDER_SITE_OTHER): Payer: Self-pay | Admitting: Nurse Practitioner

## 2019-07-28 DIAGNOSIS — I83813 Varicose veins of bilateral lower extremities with pain: Secondary | ICD-10-CM

## 2019-07-31 ENCOUNTER — Encounter: Payer: Self-pay | Admitting: Internal Medicine

## 2019-07-31 NOTE — Assessment & Plan Note (Signed)
Increased pain as outlined in last note.  Better this week - on vacation.  Compression hose.  Has f/u with [facility] next week.

## 2019-07-31 NOTE — Assessment & Plan Note (Signed)
History of IDA.  Has seen hematology.  Follow cbc and ferritin.

## 2019-07-31 NOTE — Assessment & Plan Note (Signed)
Pepcid.  Saw GI.  Some dysphagia as outlined.  Planning for EGD 08/2019.

## 2019-07-31 NOTE — Assessment & Plan Note (Signed)
Saw GI. Planning for colonoscopy in 08/2019.

## 2019-08-01 ENCOUNTER — Ambulatory Visit (INDEPENDENT_AMBULATORY_CARE_PROVIDER_SITE_OTHER): Payer: PRIVATE HEALTH INSURANCE | Admitting: Nurse Practitioner

## 2019-08-01 ENCOUNTER — Other Ambulatory Visit: Payer: Self-pay

## 2019-08-01 ENCOUNTER — Ambulatory Visit (INDEPENDENT_AMBULATORY_CARE_PROVIDER_SITE_OTHER): Payer: PRIVATE HEALTH INSURANCE

## 2019-08-01 ENCOUNTER — Encounter (INDEPENDENT_AMBULATORY_CARE_PROVIDER_SITE_OTHER): Payer: Self-pay | Admitting: Nurse Practitioner

## 2019-08-01 VITALS — BP 150/93 | HR 85 | Resp 16 | Ht 65.5 in | Wt 206.6 lb

## 2019-08-01 DIAGNOSIS — I83813 Varicose veins of bilateral lower extremities with pain: Secondary | ICD-10-CM

## 2019-08-01 DIAGNOSIS — G2581 Restless legs syndrome: Secondary | ICD-10-CM

## 2019-08-02 ENCOUNTER — Encounter (INDEPENDENT_AMBULATORY_CARE_PROVIDER_SITE_OTHER): Payer: Self-pay | Admitting: Nurse Practitioner

## 2019-08-02 NOTE — Progress Notes (Signed)
Subjective:    Patient ID: Brianna [name], female    DOB: [birth year], 49 y.o.   MRN: [id number] Chief Complaint  Patient presents with  . New Patient (Initial Visit)    ref Brianna [name] varicose veins    The patient is seen for evaluation of symptomatic varicose veins. The patient relates burning and stinging which worsened steadily throughout the course of the day, particularly with standing.  The patient currently owns her own business is been significant time and standing or sitting.  The patient also notes an aching and throbbing pain over the varicosities, particularly with prolonged dependent positions. The symptoms are significantly improved with elevation.  The patient also notes that during hot weather the symptoms are greatly intensified. The patient states the pain from the varicose veins interferes with work, daily exercise, shopping and household maintenance. At this point, the symptoms are persistent and severe enough that they're having a negative impact on lifestyle and are interfering with daily activities.  The patient has been wearing medical grade compression stockings as advised by her primary care physician.  She is done 6 months of conservative therapy wearing medical grade 1 compression stockings, elevating her legs, and utilizing over-the-counter analgesics to no improvement.  There is no history of DVT, PE or superficial thrombophlebitis. There is no history of ulceration or hemorrhage. The patient denies a significant family history of varicose veins. OB history: [id number]   There is no history of prior surgical intervention or sclerotherapy.   Today the patient underwent noninvasive studies.  The patient has no evidence of DVT or superficial venous thrombosis in the bilateral lower extremities.  There is no evidence of reflux noted in the deep venous system.  The right lower extremity has reflux in the great saphenous vein at the saphenofemoral junction extending to the distal  thigh.  The vein diameters range from 0.52 cm to 0.69 cm.  The left lower extremity has reflux in the great saphenous vein at the saphenofemoral junction extending to the knee with vein diameters ranging from 0.50 to 0.76 cm.  The patient also has reflux in the bilateral small saphenous veins.  This reflux is greater than 3 seconds in both small saphenous veins.  The right small saphenous vein measures 0.19 cm and the left measures 0.24 cm.   Review of Systems  Cardiovascular: Positive for leg swelling.       Pain over varicosities, tenderness  All other systems reviewed and are negative.      Objective:   Physical Exam Vitals reviewed.  HENT:     Head: Normocephalic.  Cardiovascular:     Rate and Rhythm: Normal rate and regular rhythm.     Pulses: Normal pulses.     Comments: Bilateral varicosities measuring at least 3 to 4 mm with some spider scattered varicosities Pulmonary:     Effort: Pulmonary effort is normal.  Skin:    Capillary Refill: Capillary refill takes less than 2 seconds.  Neurological:     Mental Status: She is alert and oriented to person, place, and time.  Psychiatric:        Mood and Affect: Mood normal.        Behavior: Behavior normal.        Thought Content: Thought content normal.        Judgment: Judgment normal.     BP (!) 150/93 (BP Location: Right Arm)   Pulse 85   Resp 16   Ht 5' 5.5" (1.664 m)   [employer]  206 lb 9.6 oz (93.7 kg)   BMI 33.86 kg/m   Past Medical History:  Diagnosis Date  . Anemia   . Chicken pox   . Depression   . Iron deficiency anemia 06/17/2018  . Jaundice due to hepatitis   . Restless leg syndrome     Social History   Socioeconomic History  . Marital status: Significant Other    Spouse name: Not on file  . Number of children: Not on file  . Years of education: Not on file  . Highest education level: Not on file  Occupational History  . Not on file  Tobacco Use  . Smoking status: Former Research scientist (life sciences)  . Smokeless tobacco:  Never Used  Substance and Sexual Activity  . Alcohol use: Yes    Comment: occasional   . Drug use: Never  . Sexual activity: Yes    Birth control/protection: Post-menopausal  Other Topics Concern  . Not on file  Social History Narrative  . Not on file   Social Determinants of Health   Financial Resource Strain:   . Difficulty of Paying Living Expenses:   Food Insecurity:   . Worried About Charity fundraiser in the Last Year:   . Arboriculturist in the Last Year:   Transportation Needs:   . Film/video editor (Medical):   Marland Kitchen Lack of Transportation (Non-Medical):   Physical Activity:   . Days of Exercise per Week:   . Minutes of Exercise per Session:   Stress:   . Feeling of Stress :   Social Connections:   . Frequency of Communication with Friends and Family:   . Frequency of Social Gatherings with Friends and Family:   . Attends Religious Services:   . Active Member of Clubs or Organizations:   . Attends Archivist Meetings:   Marland Kitchen Marital Status:   Intimate Partner Violence:   . Fear of Current or Ex-Partner:   . Emotionally Abused:   Marland Kitchen Physically Abused:   . Sexually Abused:     Past Surgical History:  Procedure Laterality Date  . CESAREAN SECTION  [phone]  . CHOLECYSTECTOMY  2003    Family History  Problem Relation Age of Onset  . Early death Mother   . Heart disease Mother   . Hyperlipidemia Mother   . Hypertension Mother   . Alcohol abuse Father   . Arthritis Father   . COPD Father   . Hyperlipidemia Father   . Hypertension Father   . Kidney disease Father   . Arthritis Maternal Grandmother   . Stroke Maternal Grandmother   . Hyperlipidemia Maternal Grandfather   . Breast cancer Paternal Aunt 45    Allergies  Allergen Reactions  . Vicodin [Hydrocodone-Acetaminophen] Hives       Assessment & Plan:   1. Varicose veins of leg with pain, bilateral Recommend  I have reviewed my previous  discussion with the patient  regarding  varicose veins and why they cause symptoms. Patient will continue  wearing graduated compression stockings class 1 on a daily basis, beginning first thing in the morning and removing them in the evening.    In addition, behavioral modification including elevation during the day was again discussed and this will continue.  The patient has utilized over the counter pain medications and has been exercising.  However, at this time conservative therapy has not alleviated the patient's symptoms of leg pain and swelling  Recommend: laser ablation of the right and  left  great saphenous veins as well as the left and right small saphenous veins, to eliminate the symptoms of pain and swelling of the lower extremities caused by the severe superficial venous reflux disease.   It was also discussed with the patient that the small saphenous veins are small in diameter and we may be unable to treat them with endovenous laser in which case we would try to utilize foam sclerotherapy for these varicosities.  Patient is in agreement with this plan.  2. Restless leg Patient does have some restless leg syndrome which may be exacerbated by her worsening varicose veins.  No medication changes today.  We will continue managed by primary care physician.   No current outpatient medications on file prior to visit.   No current facility-administered medications on file prior to visit.    There are no Patient Instructions on file for this visit. No follow-ups on file.   Kris Hartmann, NP

## 2019-08-16 ENCOUNTER — Encounter (INDEPENDENT_AMBULATORY_CARE_PROVIDER_SITE_OTHER): Payer: Self-pay

## 2019-08-18 ENCOUNTER — Other Ambulatory Visit
Admission: RE | Admit: 2019-08-18 | Discharge: 2019-08-18 | Disposition: A | Discharge status: Home / Self Care | Payer: PRIVATE HEALTH INSURANCE | Source: Ambulatory Visit | Attending: Gastroenterology | Admitting: Gastroenterology

## 2019-08-18 ENCOUNTER — Other Ambulatory Visit: Payer: Self-pay

## 2019-08-18 DIAGNOSIS — Z01812 Encounter for preprocedural laboratory examination: Secondary | ICD-10-CM | POA: Insufficient documentation

## 2019-08-18 DIAGNOSIS — Z20822 Contact with and (suspected) exposure to covid-19: Secondary | ICD-10-CM | POA: Insufficient documentation

## 2019-08-18 LAB — SARS CORONAVIRUS 2 (TAT 6-24 HRS): SARS Coronavirus 2: NEGATIVE

## 2019-08-22 ENCOUNTER — Ambulatory Visit
Admission: RE | Admit: 2019-08-22 | Discharge: 2019-08-22 | Disposition: A | Discharge status: Home / Self Care | Payer: PRIVATE HEALTH INSURANCE | Attending: Gastroenterology | Admitting: Gastroenterology

## 2019-08-22 ENCOUNTER — Encounter
Admission: RE | Disposition: A | Discharge status: Home / Self Care | Payer: Self-pay | Source: Home / Self Care | Attending: Gastroenterology

## 2019-08-22 ENCOUNTER — Ambulatory Visit: Payer: PRIVATE HEALTH INSURANCE | Admitting: Anesthesiology

## 2019-08-22 ENCOUNTER — Other Ambulatory Visit: Payer: Self-pay

## 2019-08-22 DIAGNOSIS — K449 Diaphragmatic hernia without obstruction or gangrene: Secondary | ICD-10-CM | POA: Diagnosis not present

## 2019-08-22 DIAGNOSIS — Z885 Allergy status to narcotic agent status: Secondary | ICD-10-CM | POA: Diagnosis not present

## 2019-08-22 DIAGNOSIS — Z8349 Family history of other endocrine, nutritional and metabolic diseases: Secondary | ICD-10-CM | POA: Diagnosis not present

## 2019-08-22 DIAGNOSIS — Z9049 Acquired absence of other specified parts of digestive tract: Secondary | ICD-10-CM | POA: Insufficient documentation

## 2019-08-22 DIAGNOSIS — G2581 Restless legs syndrome: Secondary | ICD-10-CM | POA: Diagnosis not present

## 2019-08-22 DIAGNOSIS — F329 Major depressive disorder, single episode, unspecified: Secondary | ICD-10-CM | POA: Insufficient documentation

## 2019-08-22 DIAGNOSIS — Z803 Family history of malignant neoplasm of breast: Secondary | ICD-10-CM | POA: Insufficient documentation

## 2019-08-22 DIAGNOSIS — Z8261 Family history of arthritis: Secondary | ICD-10-CM | POA: Insufficient documentation

## 2019-08-22 DIAGNOSIS — Z811 Family history of alcohol abuse and dependence: Secondary | ICD-10-CM | POA: Insufficient documentation

## 2019-08-22 DIAGNOSIS — K219 Gastro-esophageal reflux disease without esophagitis: Secondary | ICD-10-CM | POA: Diagnosis not present

## 2019-08-22 DIAGNOSIS — Z87891 Personal history of nicotine dependence: Secondary | ICD-10-CM | POA: Insufficient documentation

## 2019-08-22 DIAGNOSIS — Z841 Family history of disorders of kidney and ureter: Secondary | ICD-10-CM | POA: Insufficient documentation

## 2019-08-22 DIAGNOSIS — K3189 Other diseases of stomach and duodenum: Secondary | ICD-10-CM | POA: Diagnosis not present

## 2019-08-22 DIAGNOSIS — Z825 Family history of asthma and other chronic lower respiratory diseases: Secondary | ICD-10-CM | POA: Diagnosis not present

## 2019-08-22 DIAGNOSIS — Z8249 Family history of ischemic heart disease and other diseases of the circulatory system: Secondary | ICD-10-CM | POA: Diagnosis not present

## 2019-08-22 DIAGNOSIS — R1310 Dysphagia, unspecified: Secondary | ICD-10-CM | POA: Diagnosis not present

## 2019-08-22 DIAGNOSIS — Z1211 Encounter for screening for malignant neoplasm of colon: Secondary | ICD-10-CM

## 2019-08-22 DIAGNOSIS — K5730 Diverticulosis of large intestine without perforation or abscess without bleeding: Secondary | ICD-10-CM | POA: Insufficient documentation

## 2019-08-22 DIAGNOSIS — D509 Iron deficiency anemia, unspecified: Secondary | ICD-10-CM

## 2019-08-22 DIAGNOSIS — K2270 Barrett's esophagus without dysplasia: Secondary | ICD-10-CM

## 2019-08-22 HISTORY — PX: COLONOSCOPY WITH PROPOFOL: SHX5780

## 2019-08-22 HISTORY — PX: ESOPHAGOGASTRODUODENOSCOPY (EGD) WITH PROPOFOL: SHX5813

## 2019-08-22 SURGERY — COLONOSCOPY WITH PROPOFOL
Anesthesia: General

## 2019-08-22 MED ORDER — PROPOFOL 500 MG/50ML IV EMUL
INTRAVENOUS | Status: AC
  Filled 2019-08-22: qty 50

## 2019-08-22 MED ORDER — LIDOCAINE HCL (CARDIAC) PF 100 MG/5ML IV SOSY
PREFILLED_SYRINGE | INTRAVENOUS | Status: DC | PRN
  Administered 2019-08-22: 100 mg via INTRAVENOUS

## 2019-08-22 MED ORDER — SODIUM CHLORIDE 0.9 % IV SOLN
INTRAVENOUS | Status: DC
  Administered 2019-08-22: 1000 mL via INTRAVENOUS

## 2019-08-22 MED ORDER — PROPOFOL 10 MG/ML IV BOLUS
INTRAVENOUS | Status: DC | PRN
Start: 2019-08-22 — End: 2019-08-22
  Administered 2019-08-22: 100 mg via INTRAVENOUS
  Administered 2019-08-22 (×5): 50 mg via INTRAVENOUS

## 2019-08-22 MED ORDER — PROPOFOL 500 MG/50ML IV EMUL
INTRAVENOUS | Status: DC | PRN
  Administered 2019-08-22: 140 ug/kg/min via INTRAVENOUS
  Administered 2019-08-22: 120 ug/kg/min via INTRAVENOUS

## 2019-08-22 NOTE — H&P (Signed)
Vonda Antigua, MD [address], [place], [place], [place], [address] [address], [place], [place], [place], [address] Phone: [phone]  Fax: [phone]  Primary Care Physician:  Einar Pheasant, MD   Pre-Procedure History & Physical: HPI:  Brianna [name] is a 49 y.o. female is here for a colonoscopy and EGD.   Past Medical History:  Diagnosis Date  . Anemia   . Chicken pox   . Depression   . Iron deficiency anemia 06/17/2018  . Jaundice due to hepatitis   . Restless leg syndrome     Past Surgical History:  Procedure Laterality Date  . CESAREAN SECTION  [id number]  . CHOLECYSTECTOMY  2003    Prior to Admission medications   Not on File    Allergies as of 07/05/2019 - Review Complete 07/05/2019  Allergen Reaction Noted  . Vicodin [hydrocodone-acetaminophen] Hives 05/25/2018    Family History  Problem Relation Age of Onset  . Early death Mother   . Heart disease Mother   . Hyperlipidemia Mother   . Hypertension Mother   . Alcohol abuse Father   . Arthritis Father   . COPD Father   . Hyperlipidemia Father   . Hypertension Father   . Kidney disease Father   . Arthritis Maternal Grandmother   . Stroke Maternal Grandmother   . Hyperlipidemia Maternal Grandfather   . Breast cancer Paternal Aunt 36    Social History   Socioeconomic History  . Marital status: Significant Other    Spouse name: Not on file  . Number of children: Not on file  . Years of education: Not on file  . Highest education level: Not on file  Occupational History  . Not on file  Tobacco Use  . Smoking status: Former Research scientist (life sciences)  . Smokeless tobacco: Never Used  Substance and Sexual Activity  . Alcohol use: Yes    Comment: occasional   . Drug use: Never  . Sexual activity: Yes    Birth control/protection: Post-menopausal  Other Topics Concern  . Not on file  Social History Narrative  . Not on file   Social Determinants of Health   Financial Resource Strain:   .  Difficulty of Paying Living Expenses:   Food Insecurity:   . Worried About Charity fundraiser in the Last Year:   . Arboriculturist in the Last Year:   Transportation Needs:   . Film/video editor (Medical):   Marland Kitchen Lack of Transportation (Non-Medical):   Physical Activity:   . Days of Exercise per Week:   . Minutes of Exercise per Session:   Stress:   . Feeling of Stress :   Social Connections:   . Frequency of Communication with Friends and Family:   . Frequency of Social Gatherings with Friends and Family:   . Attends Religious Services:   . Active Member of Clubs or Organizations:   . Attends Archivist Meetings:   Marland Kitchen Marital Status:   Intimate Partner Violence:   . Fear of Current or Ex-Partner:   . Emotionally Abused:   Marland Kitchen Physically Abused:   . Sexually Abused:     Review of Systems: See HPI, otherwise negative ROS  Physical Exam: BP (!) 146/97   Pulse (!) 103   Temp (!) 97.2 F (36.2 C) (Temporal)   Resp 16   Ht 5\' 5"  (1.651 m)   Wt 90.7 kg   SpO2 99%   BMI 33.28 kg/m  General:   Alert,  pleasant  and cooperative in NAD Head:  Normocephalic and atraumatic. Neck:  Supple; no masses or thyromegaly. Lungs:  Clear throughout to auscultation, normal respiratory effort.    Heart:  +S1, +S2, Regular rate and rhythm, No edema. Abdomen:  Soft, nontender and nondistended. Normal bowel sounds, without guarding, and without rebound.   Neurologic:  Alert and  oriented x4;  grossly normal neurologically.  Impression/Plan: Brianna [name] is here for a colonoscopy to be performed for average risk screening and EGD for dysphagia.  Risks, benefits, limitations, and alternatives regarding the procedures have been reviewed with the patient.  Questions have been answered.  All parties agreeable.   Virgel Manifold, MD  08/22/2019, 8:11 AM

## 2019-08-22 NOTE — Anesthesia Postprocedure Evaluation (Signed)
Anesthesia Post Note  Patient: Brianna [name]  Procedure(s) Performed: COLONOSCOPY WITH PROPOFOL (N/A ) ESOPHAGOGASTRODUODENOSCOPY (EGD) WITH PROPOFOL (N/A )  Patient location during evaluation: Endoscopy Anesthesia Type: General Level of consciousness: awake and alert Pain management: pain level controlled Vital Signs Assessment: post-procedure vital signs reviewed and stable Respiratory status: spontaneous breathing, nonlabored ventilation and respiratory function stable Cardiovascular status: blood pressure returned to baseline and stable Postop Assessment: no apparent nausea or vomiting Anesthetic complications: no     Last Vitals:  Vitals:   08/22/19 0927 08/22/19 0937  BP: (!) 129/92 (!) 150/92  Pulse: 80 83  Resp: 17 19  Temp:    SpO2: 99% 100%    Last Pain:  Vitals:   08/22/19 0937  TempSrc:   PainSc: 0-No pain                 Alphonsus Sias

## 2019-08-22 NOTE — Op Note (Addendum)
[facility] Gastroenterology Patient Name: Brianna [name] Procedure Date: 08/22/2019 8:14 AM MRN: [id number] Account #: [id number] Date of Birth: [birth year] Admit Type: Outpatient Age: 49 Room: [facility] ENDO ROOM 2 Gender: Female Note Status: Finalized Procedure:             Colonoscopy Indications:           Screening for colorectal malignant neoplasm Providers:             Nichael Ehly B. Bonna Gains MD, MD Referring MD:          Einar Pheasant, MD (Referring MD) Medicines:             Monitored Anesthesia Care Complications:         No immediate complications. Procedure:             Pre-Anesthesia Assessment:                        - Prior to the procedure, a History and Physical was                         performed, and patient medications, allergies and                         sensitivities were reviewed. The patient's tolerance                         of previous anesthesia was reviewed.                        - The risks and benefits of the procedure and the                         sedation options and risks were discussed with the                         patient. All questions were answered and informed                         consent was obtained.                        - Patient identification and proposed procedure were                         verified prior to the procedure by the physician, the                         nurse, the anesthetist and the technician. The                         procedure was verified in the pre-procedure area in                         the procedure room in the endoscopy suite.                        - ASA Grade Assessment: II - A patient with mild  systemic disease.                        - After reviewing the risks and benefits, the patient                         was deemed in satisfactory condition to undergo the                         procedure.                        After obtaining informed consent, the  colonoscope was                         passed under direct vision. Throughout the procedure,                         the patient's blood pressure, pulse, and oxygen                         saturations were monitored continuously. The                         Colonoscope was introduced through the anus and                         advanced to the the cecum, identified by appendiceal                         orifice and ileocecal valve. The colonoscopy was                         performed with ease. The patient tolerated the                         procedure well. The quality of the bowel preparation                         was fair. Findings:      The perianal and digital rectal examinations were normal.      Multiple diverticula were found in the sigmoid colon.      The exam was otherwise without abnormality.      The rectum, sigmoid colon, descending colon, transverse colon, ascending       colon and cecum appeared normal.      The retroflexed view of the distal rectum and anal verge was normal and       showed no anal or rectal abnormalities. Impression:            - Preparation of the colon was fair.                        - Diverticulosis in the sigmoid colon.                        - The examination was otherwise normal.                        - The rectum, sigmoid colon, descending colon,  transverse colon, ascending colon and cecum are normal.                        - The distal rectum and anal verge are normal on                         retroflexion view.                        - No specimens collected. Recommendation:        - Discharge patient to home.                        - Resume previous diet.                        - Continue present medications.                        - Return to primary care physician as previously                         scheduled.                        - The findings and recommendations were discussed with                          the patient.                        - The findings and recommendations were discussed with                         the patient's family.                        - High fiber diet. Procedure Code(s):     --- Professional ---                        [phone], Colonoscopy, flexible; diagnostic, including                         collection of specimen(s) by brushing or washing, when                         performed (separate procedure) Diagnosis Code(s):     --- Professional ---                        Z12.11, Encounter for screening for malignant neoplasm                         of colon CPT copyright 2019 American Medical Association. All rights reserved. The codes documented in this report are preliminary and upon coder review may  be revised to meet current compliance requirements.  Vonda Antigua, MD Margretta Sidle B. Bonna Gains MD, MD 08/22/2019 9:08:22 AM This report has been signed electronically. Number of Addenda: 0 Note Initiated On: 08/22/2019 8:14 AM Scope Withdrawal Time: 0 hours 8 minutes 16 seconds  Total Procedure Duration: 0 hours 18 minutes 46 seconds  [facility]

## 2019-08-22 NOTE — Op Note (Signed)
[facility] Gastroenterology Patient Name: Brianna [name] Procedure Date: 08/22/2019 8:14 AM MRN: [id number] Account #: [id number] Date of Birth: 03/13/1971 Admit Type: Outpatient Age: 49 Room: [facility] ENDO ROOM 2 Gender: Female Note Status: Finalized Procedure:             Upper GI endoscopy Indications:           Iron deficiency anemia, Dysphagia Providers:             Bertrice Leder B. Bonna Gains MD, MD Referring MD:          Einar Pheasant, MD (Referring MD) Medicines:             Monitored Anesthesia Care Complications:         No immediate complications. Procedure:             Pre-Anesthesia Assessment:                        - The risks and benefits of the procedure and the                         sedation options and risks were discussed with the                         patient. All questions were answered and informed                         consent was obtained.                        - Patient identification and proposed procedure were                         verified prior to the procedure.                        - ASA Grade Assessment: II - A patient with mild                         systemic disease.                        After obtaining informed consent, the endoscope was                         passed under direct vision. Throughout the procedure,                         the patient's blood pressure, pulse, and oxygen                         saturations were monitored continuously. The Endoscope                         was introduced through the mouth, and advanced to the                         second part of duodenum. The upper GI endoscopy was                         accomplished  with ease. The patient tolerated the                         procedure well. Findings:      The esophagus and gastroesophageal junction were examined with white       light. There were esophageal mucosal changes classified as Barrett's       stage C0-M10 per Prague criteria. These  changes involved the mucosa at       the upper extent of the gastric folds (45 cm from the incisors)       extending to the Z-line (35 cm from the incisors). Circumferential       salmon-colored mucosa was present from 35 to 45 cm. The maximum       longitudinal extent of these esophageal mucosal changes was 10 cm in       length. This was biopsied with a cold forceps for histology. Due to the       presence of large amount of food in the stomach, biopsies every 1-2 cm       were not able to be done to avoid aspiration from an extended procedure.       Biopsies were obtained from the proximal and distal esophagus with cold       forceps for histology of suspected eosinophilic esophagitis.      A 5 cm hiatal hernia was present.      Patchy mildly erythematous mucosa without bleeding was found in the       gastric antrum. Biopsies were taken with a cold forceps for histology.      A medium amount of food (residue) was found in the gastric fundus and in       the gastric body.      Patchy atrophic mucosa was found in the second portion of the duodenum.       Biopsies were obtained in the duodenal bulb and in the second portion of       the duodenum with cold forceps for histology. Impression:            - Esophageal mucosal changes classified as Barrett's                         stage C0-M10 per Prague criteria. Biopsied.                        - 5 cm hiatal hernia.                        - Erythematous mucosa in the antrum. Biopsied.                        - A medium amount of food (residue) in the stomach.                        - Duodenal mucosal atrophy.                        - Biopsies were obtained in the duodenal bulb and in                         the second portion of the duodenum. Recommendation:        - Await pathology results.                        -  Repeat upper endoscopy at appointment to be                         scheduled for surveillance of Barrett's esophagus.                         - Return to my office in 2 weeks.                        - The findings and recommendations were discussed with                         the patient.                        - Refer to a surgeon for hiatal hernia. Procedure Code(s):     --- Professional ---                        [phone], Esophagogastroduodenoscopy, flexible,                         transoral; with biopsy, single or multiple Diagnosis Code(s):     --- Professional ---                        K22.70, Barrett's esophagus without dysplasia                        K44.9, Diaphragmatic hernia without obstruction or                         gangrene                        K31.89, Other diseases of stomach and duodenum                        D50.9, Iron deficiency anemia, unspecified                        R13.10, Dysphagia, unspecified CPT copyright 2019 American Medical Association. All rights reserved. The codes documented in this report are preliminary and upon coder review may  be revised to meet current compliance requirements.  Vonda Antigua, MD Margretta Sidle B. Bonna Gains MD, MD 08/22/2019 8:42:37 AM This report has been signed electronically. Number of Addenda: 0 Note Initiated On: 08/22/2019 8:14 AM Estimated Blood Loss:  Estimated blood loss: none.      [facility]

## 2019-08-22 NOTE — Anesthesia Preprocedure Evaluation (Addendum)
Anesthesia Evaluation  Patient identified by MRN, date of birth, ID band Patient awake    Reviewed: Allergy & Precautions, H&P , NPO status , reviewed documented beta blocker date and time   Airway Mallampati: II  TM Distance: >3 FB Neck ROM: full    Dental  (+) Caps, Chipped   Pulmonary former smoker,    Pulmonary exam normal        Cardiovascular Normal cardiovascular exam     Neuro/Psych PSYCHIATRIC DISORDERS Depression    GI/Hepatic GERD  ,  Endo/Other    Renal/GU      Musculoskeletal   Abdominal   Peds  Hematology  (+) Blood dyscrasia, anemia ,   Anesthesia Other Findings Past Medical History: No date: Anemia No date: Chicken pox No date: Depression 06/17/2018: Iron deficiency anemia No date: Jaundice due to hepatitis No date: Restless leg syndrome  Past Surgical History: [phone]: CESAREAN SECTION 2003: CHOLECYSTECTOMY  BMI    Body Mass Index: 33.28 kg/m      Reproductive/Obstetrics                           Anesthesia Physical Anesthesia Plan  ASA: II  Anesthesia Plan: General   Post-op Pain Management:    Induction: Intravenous  PONV Risk Score and Plan: 3 and Treatment may vary due to age or medical condition and TIVA  Airway Management Planned: Nasal Cannula and Natural Airway  Additional Equipment:   Intra-op Plan:   Post-operative Plan:   Informed Consent: I have reviewed the patients History and Physical, chart, labs and discussed the procedure including the risks, benefits and alternatives for the proposed anesthesia with the patient or authorized representative who has indicated his/her understanding and acceptance.     Dental Advisory Given  Plan Discussed with: CRNA  Anesthesia Plan Comments:         Anesthesia Quick Evaluation

## 2019-08-22 NOTE — Transfer of Care (Signed)
Immediate Anesthesia Transfer of Care Note  Patient: Brianna [name]  Procedure(s) Performed: COLONOSCOPY WITH PROPOFOL (N/A ) ESOPHAGOGASTRODUODENOSCOPY (EGD) WITH PROPOFOL (N/A )  Patient Location: PACU and Endoscopy Unit  Anesthesia Type:General  Level of Consciousness: awake  Airway & Oxygen Therapy: Patient Spontanous Breathing  Post-op Assessment: Report given to RN  Post vital signs: stable  Last Vitals:  Vitals Value Taken Time  BP 140/99 08/22/19 0908  Temp 36.2 C 08/22/19 0907  Pulse 87 08/22/19 0908  Resp 15 08/22/19 0908  SpO2 100 % 08/22/19 0908  Vitals shown include unvalidated device data.  Last Pain:  Vitals:   08/22/19 0907  TempSrc: Temporal  PainSc: 0-No pain         Complications: No apparent anesthesia complications

## 2019-08-23 ENCOUNTER — Encounter: Payer: Self-pay | Admitting: *Deleted

## 2019-08-23 LAB — SURGICAL PATHOLOGY

## 2019-08-25 ENCOUNTER — Encounter (INDEPENDENT_AMBULATORY_CARE_PROVIDER_SITE_OTHER): Payer: Self-pay

## 2019-09-05 ENCOUNTER — Encounter: Payer: Self-pay | Admitting: Gastroenterology

## 2019-09-05 ENCOUNTER — Ambulatory Visit (INDEPENDENT_AMBULATORY_CARE_PROVIDER_SITE_OTHER): Payer: PRIVATE HEALTH INSURANCE | Admitting: Gastroenterology

## 2019-09-05 ENCOUNTER — Other Ambulatory Visit: Payer: Self-pay

## 2019-09-05 VITALS — BP 137/89 | HR 91 | Temp 98.0°F | Ht 64.0 in | Wt 204.6 lb

## 2019-09-05 DIAGNOSIS — K219 Gastro-esophageal reflux disease without esophagitis: Secondary | ICD-10-CM | POA: Diagnosis not present

## 2019-09-05 DIAGNOSIS — D509 Iron deficiency anemia, unspecified: Secondary | ICD-10-CM | POA: Diagnosis not present

## 2019-09-05 DIAGNOSIS — R1310 Dysphagia, unspecified: Secondary | ICD-10-CM

## 2019-09-05 MED ORDER — OMEPRAZOLE 20 MG PO CPDR: 20.0000 mg | DELAYED_RELEASE_CAPSULE | Freq: Every day | ORAL | 2 refills | Status: DC

## 2019-09-05 MED ORDER — OMEPRAZOLE 20 MG PO CPDR: 20.0000 mg | DELAYED_RELEASE_CAPSULE | Freq: Two times a day (BID) | ORAL | 2 refills | Status: DC

## 2019-09-05 NOTE — Progress Notes (Signed)
Vonda Antigua, MD [address]  [place]  [place], [place] [address]  Main: [phone]  Fax: [phone]   Primary Care Physician: Einar Pheasant, MD   Chief complaint: Follow-up from procedures, reflux  HPI: Brianna [name] is a 49 y.o. female recently underwent EGD and colonoscopy, with EGD showing food in the stomach, and Barrett's esophagus which could not be biopsied extensively due to long segment and food in stomach.  Colonoscopy showed a fair prep.  Patient continues to have reflux issues, especially at night with Pepcid daily.  She wakes up with heartburn at night intermittently.  Dysphagia is better with her diet changes, however, continues to have issues with meats.  No episodes of food impaction.  Current Outpatient Medications  Medication Sig Dispense Refill  . famotidine (PEPCID) 10 MG tablet Take 10 mg by mouth daily.    Marland Kitchen omeprazole (PRILOSEC) 20 MG capsule Take 1 capsule (20 mg total) by mouth daily. Take 1 capsule (20 mg total) two times a day for 30 days and then take 1 capsule a day for two months. 60 capsule 2   No current facility-administered medications for this visit.    Allergies as of 09/05/2019 - Review Complete 09/05/2019  Allergen Reaction Noted  . Vicodin [hydrocodone-acetaminophen] Hives 05/25/2018    ROS:  General: Negative for anorexia, weight loss, fever, chills, fatigue, weakness. ENT: Negative for hoarseness, difficulty swallowing , nasal congestion. CV: Negative for chest pain, angina, palpitations, dyspnea on exertion, peripheral edema.  Respiratory: Negative for dyspnea at rest, dyspnea on exertion, cough, sputum, wheezing.  GI: See history of present illness. GU:  Negative for dysuria, hematuria, urinary incontinence, urinary frequency, nocturnal urination.  Endo: Negative for unusual weight change.    Physical Examination:   BP 137/89   Pulse 91   Temp 98 F (36.7 C) (Oral)   Ht 5\' 4"  (1.626 m)   Wt 204 lb 9.6 oz  (92.8 kg)   BMI 35.12 kg/m   General: Well-nourished, well-developed in no acute distress.  Eyes: No icterus. Conjunctivae pink. Mouth: Oropharyngeal mucosa moist and pink , no lesions erythema or exudate. Neck: Supple, Trachea midline Abdomen: Bowel sounds are normal, nontender, nondistended, no hepatosplenomegaly or masses, no abdominal bruits or hernia , no rebound or guarding.   Extremities: No lower extremity edema. No clubbing or deformities. Neuro: Alert and oriented x 3.  Grossly intact. Skin: Warm and dry, no jaundice.   Psych: Alert and cooperative, normal mood and affect.   Labs: CMP     Component Value Date/Time   NA 139 06/01/2019 1045   K 4.2 06/01/2019 1045   CL 103 06/01/2019 1045   CO2 30 06/01/2019 1045   GLUCOSE 91 06/01/2019 1045   BUN 10 06/01/2019 1045   CREATININE 0.73 06/01/2019 1045   CALCIUM 8.9 06/01/2019 1045   PROT 7.0 06/01/2019 1045   ALBUMIN 4.1 06/01/2019 1045   AST 20 06/01/2019 1045   ALT 21 06/01/2019 1045   ALKPHOS 64 06/01/2019 1045   BILITOT 0.5 06/01/2019 1045   Lab Results  Component Value Date   WBC 5.3 06/01/2019   HGB 14.0 06/01/2019   HCT 42.8 06/01/2019   MCV 90.9 06/01/2019   PLT 240.0 06/01/2019    Imaging Studies: No results found.  Assessment and Plan:   Brianna [name] is a 49 y.o. y/o female with reflux, here for follow-up  Patient will need repeat EGD with clear liquid diet for 24 hours prior to the procedure, and  then n.p.o. past midnight due to food in the stomach on last exam, which led to limited exam.  However, given the finding of possible long segment Barrett's esophagus, will change H2 RA to PPI.  Patient reporting severe heartburn symptoms specially at night.  Omeprazole 20 twice daily for 30 days, then decrease to once daily  (Risks of PPI use were discussed with patient including bone loss, C. Diff diarrhea, pneumonia, infections, CKD, electrolyte abnormalities.  Pt. Verbalizes understanding and  chooses to continue the medication.)  Repeat colonoscopy in 1 year with 2-day prep due to fair prep on last exam  Patient does not want to schedule her EGD until November, due to busy work schedule.  Set recall.  Follow-up capsule study for iron deficiency anemia  In addition the above, the risks of small bowel capsule getting stuck in the GI tract were discussed. Need for surgery for removal of the capsule, if this occurs were discussed as well. This usually occurs if a stricture, mass or abnormality is present in the small bowel. Pt verbalized understanding and is agreeable to proceed with small bowel capsule study.     Dr Vonda Antigua

## 2019-09-05 NOTE — Patient Instructions (Signed)
We will contact you to schedule your EGD (Endoscopy) in November 2021.  We will contact you in 1 year for you to have your colonoscopy in May 2022.

## 2019-09-05 NOTE — Addendum Note (Signed)
Addended by: Wayna Chalet on: 09/05/2019 03:25 PM   Modules accepted: Orders

## 2019-09-21 ENCOUNTER — Encounter: Admission: RE | Payer: Self-pay | Source: Home / Self Care

## 2019-09-21 ENCOUNTER — Ambulatory Visit
Admission: RE | Admit: 2019-09-21 | Payer: PRIVATE HEALTH INSURANCE | Source: Home / Self Care | Admitting: Gastroenterology

## 2019-09-21 SURGERY — IMAGING PROCEDURE, GI TRACT, INTRALUMINAL, VIA CAPSULE
Anesthesia: General

## 2019-09-22 ENCOUNTER — Other Ambulatory Visit: Payer: Self-pay

## 2019-09-22 DIAGNOSIS — K449 Diaphragmatic hernia without obstruction or gangrene: Secondary | ICD-10-CM

## 2019-10-03 ENCOUNTER — Ambulatory Visit: Payer: PRIVATE HEALTH INSURANCE | Admitting: Surgery

## 2019-10-11 ENCOUNTER — Encounter: Payer: Self-pay | Admitting: *Deleted

## 2019-10-20 ENCOUNTER — Other Ambulatory Visit: Payer: Self-pay

## 2019-10-20 ENCOUNTER — Encounter (INDEPENDENT_AMBULATORY_CARE_PROVIDER_SITE_OTHER): Payer: Self-pay | Admitting: Vascular Surgery

## 2019-10-20 ENCOUNTER — Ambulatory Visit (INDEPENDENT_AMBULATORY_CARE_PROVIDER_SITE_OTHER): Payer: PRIVATE HEALTH INSURANCE | Admitting: Vascular Surgery

## 2019-10-20 VITALS — BP 140/97 | HR 87 | Ht 64.0 in | Wt 205.0 lb

## 2019-10-20 DIAGNOSIS — I83811 Varicose veins of right lower extremities with pain: Secondary | ICD-10-CM

## 2019-10-20 DIAGNOSIS — I83813 Varicose veins of bilateral lower extremities with pain: Secondary | ICD-10-CM

## 2019-10-20 NOTE — Progress Notes (Signed)
    MRN : [id number]  Brianna [name] is a 49 y.o. (Dec 14, 1970) female who presents with chief complaint of painful varicose veins.    The patient's right lower extremity was sterilely prepped and draped.  The ultrasound machine was used to visualize the right great saphenous vein throughout its course.  A segment at the knee was selected for access.  The saphenous vein was accessed without difficulty using ultrasound guidance with a micropuncture needle.   An 0.018  wire was placed beyond the saphenofemoral junction through the sheath and the microneedle was removed.  The 65 cm sheath was then placed over the wire and the wire and dilator were removed.  The laser fiber was placed through the sheath and its tip was placed approximately 2 cm below the saphenofemoral junction.  Tumescent anesthesia was then created with a dilute lidocaine solution.  Laser energy was then delivered with constant withdrawal of the sheath and laser fiber.  Approximately 3410 Joules of energy were delivered over a length of 32 cm.    The small saphenous was then visualized this is less than 2 mm it does not appear to have any tributaries in communication with it.  Given the small size and the lack of associated varicosities I elected not to treat the small saphenous.  However, incidental notation is made of a cluster of varicose veins within the medial head of the gastroc muscle.  Although I cannot identify a small perforator coursing through the gastroc fascia this perforator is 2 mm in diameter and does not appear to be associated with any subcutaneous varicosities.  The small Sterile dressings were placed.  The patient tolerated the procedure well without complications.

## 2019-10-25 ENCOUNTER — Ambulatory Visit (INDEPENDENT_AMBULATORY_CARE_PROVIDER_SITE_OTHER): Payer: PRIVATE HEALTH INSURANCE

## 2019-10-25 ENCOUNTER — Other Ambulatory Visit (INDEPENDENT_AMBULATORY_CARE_PROVIDER_SITE_OTHER): Payer: Self-pay | Admitting: Vascular Surgery

## 2019-10-25 ENCOUNTER — Other Ambulatory Visit: Payer: Self-pay

## 2019-10-25 DIAGNOSIS — I83811 Varicose veins of right lower extremities with pain: Secondary | ICD-10-CM | POA: Diagnosis not present

## 2019-11-03 ENCOUNTER — Ambulatory Visit (INDEPENDENT_AMBULATORY_CARE_PROVIDER_SITE_OTHER): Payer: PRIVATE HEALTH INSURANCE | Admitting: Vascular Surgery

## 2019-11-03 ENCOUNTER — Other Ambulatory Visit: Payer: Self-pay

## 2019-11-03 ENCOUNTER — Encounter (INDEPENDENT_AMBULATORY_CARE_PROVIDER_SITE_OTHER): Payer: Self-pay | Admitting: Vascular Surgery

## 2019-11-03 VITALS — BP 128/88 | HR 93 | Resp 18 | Ht 65.0 in | Wt 205.0 lb

## 2019-11-03 DIAGNOSIS — I83813 Varicose veins of bilateral lower extremities with pain: Secondary | ICD-10-CM | POA: Diagnosis not present

## 2019-11-03 NOTE — Progress Notes (Signed)
    MRN : [id number]  Brianna [name] is a 49 y.o. ([birth year]) female who presents with chief complaint of painful varicose veins.    The patient's left lower extremity was sterilely prepped and draped.  The ultrasound machine was used to visualize the left great saphenous vein throughout its course.  A segment below the knee was selected for access.  The saphenous vein was accessed without difficulty using ultrasound guidance with a micropuncture needle.   An 0.018  wire was placed beyond the saphenofemoral junction through the sheath and the microneedle was removed.  The 65 cm sheath was then placed over the wire and the wire and dilator were removed.  The laser fiber was placed through the sheath and its tip was placed approximately 2 cm below the saphenofemoral junction.  Tumescent anesthesia was then created with a dilute lidocaine solution.  Laser energy was then delivered with constant withdrawal of the sheath and laser fiber.  Approximately 1872 Joules of energy were delivered over a length of 40 cm.  Sterile dressings were placed.  The patient tolerated the procedure well without complications.

## 2019-11-07 ENCOUNTER — Ambulatory Visit (INDEPENDENT_AMBULATORY_CARE_PROVIDER_SITE_OTHER): Payer: PRIVATE HEALTH INSURANCE

## 2019-11-07 ENCOUNTER — Other Ambulatory Visit: Payer: Self-pay

## 2019-11-07 ENCOUNTER — Other Ambulatory Visit (INDEPENDENT_AMBULATORY_CARE_PROVIDER_SITE_OTHER): Payer: Self-pay | Admitting: Vascular Surgery

## 2019-11-07 DIAGNOSIS — I83813 Varicose veins of bilateral lower extremities with pain: Secondary | ICD-10-CM | POA: Diagnosis not present

## 2019-11-08 ENCOUNTER — Ambulatory Visit (INDEPENDENT_AMBULATORY_CARE_PROVIDER_SITE_OTHER): Payer: PRIVATE HEALTH INSURANCE | Admitting: Internal Medicine

## 2019-11-08 ENCOUNTER — Encounter: Payer: Self-pay | Admitting: Internal Medicine

## 2019-11-08 ENCOUNTER — Other Ambulatory Visit: Payer: Self-pay

## 2019-11-08 DIAGNOSIS — I83813 Varicose veins of bilateral lower extremities with pain: Secondary | ICD-10-CM | POA: Diagnosis not present

## 2019-11-08 DIAGNOSIS — R61 Generalized hyperhidrosis: Secondary | ICD-10-CM

## 2019-11-08 DIAGNOSIS — D509 Iron deficiency anemia, unspecified: Secondary | ICD-10-CM

## 2019-11-08 DIAGNOSIS — K2270 Barrett's esophagus without dysplasia: Secondary | ICD-10-CM

## 2019-11-08 DIAGNOSIS — R635 Abnormal weight gain: Secondary | ICD-10-CM

## 2019-11-08 DIAGNOSIS — K219 Gastro-esophageal reflux disease without esophagitis: Secondary | ICD-10-CM

## 2019-11-08 NOTE — Progress Notes (Addendum)
Patient ID: Brianna [name], female   DOB: 30-Oct-1970, 49 y.o.   MRN: [id number]   Subjective:    Patient ID: Brianna [name], female    DOB: 07-19-1970, 49 y.o.   MRN: [id number]  HPI This visit occurred during the SARS-CoV-2 public health emergency.  Safety protocols were in place, including screening questions prior to the visit, additional usage of staff PPE, and extensive cleaning of exam room while observing appropriate contact time as indicated for disinfecting solutions.  Patient here for a scheduled follow up.  She reports she is doing relatively well.  Working.  Increased stress at work.  Appears to be handling.  Stays active.  No chest pain or sob reported.  No cough or congestion.  Seeing Dr Delana Meyer for varicose veins.  S/p laser treatment.  Just reevaluated yesterday.  Some redness and firm area - persistent - better today than yesterday.  Also seeing Dr Ronelle Nigh - s/p EGD and colonoscopy recently with EGD - food in the stomach and barrett's esophagus.  Colonoscopy - fair prep.  Placed on omeprazole.  Planning for f/u EGD 02/2020 and f/u colonoscopy in one year.  Bowels stable.  Discussed diet and exercise.  She desires trial of saxenda to help with weight loss.     Past Medical History:  Diagnosis Date  . Anemia   . Chicken pox   . Depression   . Iron deficiency anemia 06/17/2018  . Jaundice due to hepatitis   . Restless leg syndrome    Past Surgical History:  Procedure Laterality Date  . CESAREAN SECTION  [phone]  . CHOLECYSTECTOMY  2003  . COLONOSCOPY WITH PROPOFOL N/A 08/22/2019   Procedure: COLONOSCOPY WITH PROPOFOL;  Surgeon: Virgel Manifold, MD;  Location: [facility] ENDOSCOPY;  Service: Endoscopy;  Laterality: N/A;  . ESOPHAGOGASTRODUODENOSCOPY (EGD) WITH PROPOFOL N/A 08/22/2019   Procedure: ESOPHAGOGASTRODUODENOSCOPY (EGD) WITH PROPOFOL;  Surgeon: Virgel Manifold, MD;  Location: [facility] ENDOSCOPY;  Service: Endoscopy;  Laterality: N/A;   Family History  Problem Relation  Age of Onset  . Early death Mother   . Heart disease Mother   . Hyperlipidemia Mother   . Hypertension Mother   . Alcohol abuse Father   . Arthritis Father   . COPD Father   . Hyperlipidemia Father   . Hypertension Father   . Kidney disease Father   . Arthritis Maternal Grandmother   . Stroke Maternal Grandmother   . Hyperlipidemia Maternal Grandfather   . Breast cancer Paternal Aunt 44   Social History   Socioeconomic History  . Marital status: Significant Other    Spouse name: Not on file  . Number of children: Not on file  . Years of education: Not on file  . Highest education level: Not on file  Occupational History  . Not on file  Tobacco Use  . Smoking status: Former Research scientist (life sciences)  . Smokeless tobacco: Never Used  Vaping Use  . Vaping Use: Never used  Substance and Sexual Activity  . Alcohol use: Yes    Comment: occasional   . Drug use: Never  . Sexual activity: Yes    Birth control/protection: Post-menopausal  Other Topics Concern  . Not on file  Social History Narrative  . Not on file   Social Determinants of Health   Financial Resource Strain:   . Difficulty of Paying Living Expenses:   Food Insecurity:   . Worried About Charity fundraiser in the Last Year:   . [place] in the Last  Year:   Transportation Needs:   . Film/video editor (Medical):   Marland Kitchen Lack of Transportation (Non-Medical):   Physical Activity:   . Days of Exercise per Week:   . Minutes of Exercise per Session:   Stress:   . Feeling of Stress :   Social Connections:   . Frequency of Communication with Friends and Family:   . Frequency of Social Gatherings with Friends and Family:   . Attends Religious Services:   . Active Member of Clubs or Organizations:   . Attends Archivist Meetings:   Marland Kitchen Marital Status:     Outpatient Encounter Medications as of 11/08/2019  Medication Sig  . omeprazole (PRILOSEC) 20 MG capsule Take 1 capsule (20 mg total) by mouth 2 (two) times  daily before a meal. Take 1 capsule (20 mg total) two times a day for 30 days and then take 1 capsule a day for two months.  . [DISCONTINUED] famotidine (PEPCID) 10 MG tablet Take 10 mg by mouth daily. (Patient not taking: Reported on 10/20/2019)   No facility-administered encounter medications on file as of 11/08/2019.    Review of Systems  Constitutional: Negative for appetite change and unexpected weight change.  HENT: Negative for congestion and sinus pressure.   Respiratory: Negative for cough, chest tightness and shortness of breath.   Cardiovascular: Negative for chest pain, palpitations and leg swelling.  Gastrointestinal: Negative for abdominal pain, diarrhea, nausea and vomiting.  Genitourinary: Negative for difficulty urinating and dysuria.  Musculoskeletal: Negative for joint swelling and myalgias.  Skin: Negative for rash.       S/p laser treatment - leg - some surrounding erythema and firm area.    Neurological: Negative for dizziness, light-headedness and headaches.  Psychiatric/Behavioral: Negative for agitation and dysphoric mood.       Objective:    Physical Exam Vitals reviewed.  Constitutional:      General: She is not in acute distress.    Appearance: Normal appearance.  HENT:     Head: Normocephalic and atraumatic.     Right Ear: External ear normal.     Left Ear: External ear normal.  Eyes:     General: No scleral icterus.       Right eye: No discharge.        Left eye: No discharge.     Conjunctiva/sclera: Conjunctivae normal.  Neck:     Thyroid: No thyromegaly.  Cardiovascular:     Rate and Rhythm: Normal rate and regular rhythm.  Pulmonary:     Effort: No respiratory distress.     Breath sounds: Normal breath sounds. No wheezing.  Abdominal:     General: Bowel sounds are normal.     Palpations: Abdomen is soft.     Tenderness: There is no abdominal tenderness.  Musculoskeletal:        General: No swelling or tenderness.     Cervical back: Neck  supple. No tenderness.  Lymphadenopathy:     Cervical: No cervical adenopathy.  Skin:    Findings: No rash.     Comments: S/p laser treatment.  Surrounding erythema and firmness.  (just evaluated by vascular yesterday.  Reports is some better)  Neurological:     Mental Status: She is alert.  Psychiatric:        Mood and Affect: Mood normal.        Behavior: Behavior normal.     BP 124/76   Pulse 100   Temp 98 F (36.7 C)  Resp 16   Ht 5\' 5"  (1.651 m)   Wt 205 lb (93 kg)   SpO2 99%   BMI 34.11 kg/m  Wt Readings from Last 3 Encounters:  11/08/19 205 lb (93 kg)  11/03/19 205 lb (93 kg)  10/20/19 205 lb (93 kg)     Lab Results  Component Value Date   WBC 5.3 06/01/2019   HGB 14.0 06/01/2019   HCT 42.8 06/01/2019   PLT 240.0 06/01/2019   GLUCOSE 91 06/01/2019   CHOL 226 (H) 06/01/2019   TRIG 199.0 (H) 06/01/2019   HDL 59.80 06/01/2019   LDLCALC 126 (H) 06/01/2019   ALT 21 06/01/2019   AST 20 06/01/2019   NA 139 06/01/2019   K 4.2 06/01/2019   CL 103 06/01/2019   CREATININE 0.73 06/01/2019   BUN 10 06/01/2019   CO2 30 06/01/2019   TSH 3.46 06/01/2019       Assessment & Plan:   Problem List Items Addressed This Visit    Barrett's esophagus without dysplasia    EGD as outlined.  F/u EGD with GI - 02/2020.  Continue prilosec.       GERD (gastroesophageal reflux disease)    Saw GI.  EGD as outlined.  On prilosec now. Planning for f/u EGD 02/2020      Iron deficiency anemia    Has seen hematology.  Follow cbc and iron studies.       Sweating increase    Discussed with her. Discussed possible etiologies.  Wants to hold on pulmonary referral.       Varicose veins of leg with pain, bilateral    S/p laser treatment as outlined.  F/u with vascular.  Area checked yesterday.  States is some better today.  Follow closely.        Weight gain    Discussed diet and exercise.  Discussed weight loss.  She is interested in saxenda, etc.  CCM referral to discuss  further - insurance approval, etc.       Relevant Orders   Ambulatory referral to Chronic Care Management Services       Einar Pheasant, MD

## 2019-11-13 ENCOUNTER — Encounter: Payer: Self-pay | Admitting: Internal Medicine

## 2019-11-13 NOTE — Assessment & Plan Note (Signed)
Saw GI.  EGD as outlined.  On prilosec now. Planning for f/u EGD 02/2020

## 2019-11-13 NOTE — Assessment & Plan Note (Signed)
Has seen hematology.  Follow cbc and iron studies.  

## 2019-11-13 NOTE — Assessment & Plan Note (Signed)
EGD as outlined.  F/u EGD with GI - 02/2020.  Continue prilosec.

## 2019-11-13 NOTE — Assessment & Plan Note (Signed)
S/p laser treatment as outlined.  F/u with vascular.  Area checked yesterday.  States is some better today.  Follow closely.

## 2019-11-13 NOTE — Addendum Note (Signed)
Addended by: Alisa Graff on: 11/13/2019 05:24 PM   Modules accepted: Orders

## 2019-11-13 NOTE — Assessment & Plan Note (Signed)
Discussed with her. Discussed possible etiologies.  Wants to hold on pulmonary referral.

## 2019-11-13 NOTE — Assessment & Plan Note (Signed)
Discussed diet and exercise.  Discussed weight loss.  She is interested in saxenda, etc.  CCM referral to discuss further - insurance approval, etc.

## 2019-11-14 ENCOUNTER — Telehealth: Payer: Self-pay | Admitting: *Deleted

## 2019-11-14 NOTE — Chronic Care Management (AMB) (Signed)
  Care Management   Note  11/14/2019 Name: Monquie Fulgham MRN: [id number] DOB: 01/09/1971  Luberta Grabinski is a 49 y.o. year old female who is a primary care patient of Einar Pheasant, MD. I reached out to [name] by phone today in response to a referral sent by Ms. Darnell Level PCP Einar Pheasant, MD.    Ms. Carriveau was given information about care management services today including:  1. Care management services include personalized support from designated clinical staff supervised by her physician, including individualized plan of care and coordination with other care providers 2. 24/7 contact phone numbers for assistance for urgent and routine care needs. 3. The patient may stop care management services at any time by phone call to the office staff.  Patient agreed to services and verbal consent obtained.   Follow up plan: Telephone appointment with care management team member scheduled for:11/29/2019.  [place], [place] [address] Direct Dial: [phone] [email or web address]

## 2019-11-28 ENCOUNTER — Encounter (INDEPENDENT_AMBULATORY_CARE_PROVIDER_SITE_OTHER): Payer: Self-pay | Admitting: Vascular Surgery

## 2019-11-28 ENCOUNTER — Ambulatory Visit (INDEPENDENT_AMBULATORY_CARE_PROVIDER_SITE_OTHER): Payer: PRIVATE HEALTH INSURANCE | Admitting: Vascular Surgery

## 2019-11-28 ENCOUNTER — Other Ambulatory Visit: Payer: Self-pay

## 2019-11-28 VITALS — BP 152/101 | HR 88 | Resp 16 | Wt 205.8 lb

## 2019-11-28 DIAGNOSIS — I83813 Varicose veins of bilateral lower extremities with pain: Secondary | ICD-10-CM

## 2019-11-28 DIAGNOSIS — K219 Gastro-esophageal reflux disease without esophagitis: Secondary | ICD-10-CM

## 2019-11-28 NOTE — Progress Notes (Signed)
MRN : [id number]  Brianna [name] is a 49 y.o. (May 12, 1970) female who presents with chief complaint of  Chief Complaint  Patient presents with   Follow-up    post laser follow up  .  History of Present Illness:   The patient returns to the office for followup status post laser ablation of the right great saphenous vein on 10/20/2019 and left saphenous vein on 11/03/2019.  The patient note significant improvement in the lower extremity pain but not resolution of the symptoms. The patient notes multiple residual varicosities bilaterally which continued to hurt with dependent positions and remained tender to palpation. The patient's swelling is minimally from preoperative status. The patient continues to wear graduated compression stockings on a daily basis but these are not eliminating the pain and discomfort. The patient continues to use over-the-counter anti-inflammatory medications to treat the pain and related symptoms but this has not given the patient relief. The patient notes the pain in the lower extremities is causing problems with daily exercise, problems at work and even with household activities such as preparing meals and doing dishes.  The patient is otherwise done well and there have been no complications related to the laser procedure or interval changes in the patient's overall   Post laser ultrasound shows successful ablation of the great saphenous vein    Current Meds  Medication Sig   omeprazole (PRILOSEC) 20 MG capsule Take 1 capsule (20 mg total) by mouth 2 (two) times daily before a meal. Take 1 capsule (20 mg total) two times a day for 30 days and then take 1 capsule a day for two months.    Past Medical History:  Diagnosis Date   Anemia    Chicken pox    Depression    Iron deficiency anemia 06/17/2018   Jaundice due to hepatitis    Restless leg syndrome     Past Surgical History:  Procedure Laterality Date   CESAREAN SECTION  [phone]    CHOLECYSTECTOMY  2003   COLONOSCOPY WITH PROPOFOL N/A 08/22/2019   Procedure: COLONOSCOPY WITH PROPOFOL;  Surgeon: Virgel Manifold, MD;  Location: [facility] END[facility]COPY;  Service: Endoscopy;  Laterality: N/A;   ESOPHAGOGASTRODUODENOSCOPY (EGD) WITH PROPOFOL N/A 08/22/2019   Procedure: ESOPHAGOGASTRODUODENOSCOPY (EGD) WITH PROPOFOL;  Surgeon: Virgel Manifold, MD;  Location: [facility] END[facility]COPY;  Service: Endoscopy;  Laterality: N/A;    Social History Social History   Tobacco Use   Smoking status: Former Smoker   Smokeless tobacco: Never Used  Scientific laboratory technician Use: Never used  Substance Use Topics   Alcohol use: Yes    Comment: occasional    Drug use: Never    Family History Family History  Problem Relation Age of Onset   Early death Mother    Heart disease Mother    Hyperlipidemia Mother    Hypertension Mother    Alcohol abuse Father    Arthritis Father    COPD Father    Hyperlipidemia Father    Hypertension Father    Kidney disease Father    Arthritis Maternal Grandmother    Stroke Maternal Grandmother    Hyperlipidemia Maternal Grandfather    Breast cancer Paternal Aunt 50    Allergies  Allergen Reactions   Vicodin [Hydrocodone-Acetaminophen] Hives     REVIEW OF SYSTEMS (Negative unless checked)  Constitutional: [] Weight loss  [] Fever  [] Chills Cardiac: [] Chest pain   [] Chest pressure   [] Palpitations   [] Shortness of breath when laying flat   [] Shortness of  breath with exertion. Vascular:  [] Pain in legs with walking   [x] Pain in legs at rest  [] History of DVT   [] Phlebitis   [x] Swelling in legs   [x] Varicose veins   [] Non-healing ulcers Pulmonary:   [] Uses home oxygen   [] Productive cough   [] Hemoptysis   [] Wheeze  [] COPD   [] Asthma Neurologic:  [] Dizziness   [] Seizures   [] History of stroke   [] History of TIA  [] Aphasia   [] Vissual changes   [] Weakness or numbness in arm   [] Weakness or numbness in leg Musculoskeletal:   [] Joint swelling    [] Joint pain   [] Low back pain Hematologic:  [] Easy bruising  [] Easy bleeding   [] Hypercoagulable state   [] Anemic Gastrointestinal:  [] Diarrhea   [] Vomiting  [x] Gastroesophageal reflux/heartburn   [] Difficulty swallowing. Genitourinary:  [] Chronic kidney disease   [] Difficult urination  [] Frequent urination   [] Blood in urine Skin:  [] Rashes   [] Ulcers  Psychological:  [] History of anxiety   []  History of major depression.  Physical Examination  Vitals:   11/28/19 1553  BP: (!) 152/101  Pulse: 88  Resp: 16  Weight: 205 lb 12.8 oz (93.4 kg)   Body mass index is 34.25 kg/m. Gen: WD/WN, NAD Head: [place]/AT, No temporalis wasting.  Ear/Nose/Throat: Hearing grossly intact, nares w/o erythema or drainage Eyes: PER, EOMI, sclera nonicteric.  Neck: Supple, no large masses.   Pulmonary:  Good air movement, no audible wheezing bilaterally, no use of accessory muscles.  Cardiac: RRR, no JVD Vascular:  Large varicosities present extensively greater than 10 mm bilaterally.  Mild venous stasis changes to the legs bilaterally.  2+ soft pitting edema Vessel Right Left  Radial Palpable Palpable  Gastrointestinal: Non-distended. No guarding/no peritoneal signs.  Musculoskeletal: M/S 5/5 throughout.  No deformity or atrophy.  Neurologic: CN 2-12 intact. Symmetrical.  Speech is fluent. Motor exam as listed above. Psychiatric: Judgment intact, Mood & affect appropriate for pt's clinical situation. Dermatologic: No rashes or ulcers noted.  No changes consistent with cellulitis. Lymph : No lichenification or skin changes of chronic lymphedema.  CBC Lab Results  Component Value Date   WBC 5.3 06/01/2019   HGB 14.0 06/01/2019   HCT 42.8 06/01/2019   MCV 90.9 06/01/2019   PLT 240.0 06/01/2019    BMET    Component Value Date/Time   NA 139 06/01/2019 1045   K 4.2 06/01/2019 1045   CL 103 06/01/2019 1045   CO2 30 06/01/2019 1045   GLUCOSE 91 06/01/2019 1045   BUN 10 06/01/2019 1045   CREATININE  0.73 06/01/2019 1045   CALCIUM 8.9 06/01/2019 1045   CrCl cannot be calculated (Patient's most recent lab result is older than the maximum 21 days allowed.).  COAG No results found for: INR, PROTIME  Radiology VAS [place] LOWER EXTREMITY VENOUS POST ABLATION  Result Date: 11/14/2019  Lower Venous Reflux Study Indications: Varicosities, and Pain. Other Indications: Right GSV laser ablation on 10/20/19; Left GSV laser ablation                    on 11/03/19. Performing Technologist: Blondell Reveal RT, RDMS, RVT  Examination Guidelines: A complete evaluation includes B-mode imaging, spectral Doppler, color Doppler, and power Doppler as needed of all accessible portions of each vessel. Bilateral testing is considered an integral part of a complete examination. Limited examinations for reoccurring indications may be performed as noted. The reflux portion of the exam is performed with the patient in reverse Trendelenburg. Significant venous reflux is defined  as >500 ms in the superficial venous system, and >1 second in the deep venous system.   Summary: Left: - No evidence of deep vein thrombosis seen in the left lower extremity, from the common femoral through the popliteal veins. 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  *See table(s) above for measurements and observations. Electronically signed by Hortencia Pilar MD on 11/14/2019 at 2:21:27 PM.    Final      Assessment/Plan 1. Varicose veins of leg with pain, bilateral Recommend:  The patient has had successful ablation of the previously incompetent saphenous venous system but still has persistent symptoms of pain and swelling that are having a negative impact on daily life and daily activities.  Patient should undergo injection sclerotherapy to treat the residual varicosities.  The risks, benefits and alternative therapies were reviewed in detail with the patient.  All questions were answered.  The  patient agrees to proceed with sclerotherapy at their convenience.  The patient will continue wearing the graduated compression stockings and using the over-the-counter pain medications to treat her symptoms.   2. Gastroesophageal reflux disease, unspecified whether esophagitis present Continue PPI as already ordered, this medication has been reviewed and there are no changes at this time.  Avoidence of caffeine and alcohol  Moderate elevation of the head of the bed     Hortencia Pilar, MD  11/28/2019 3:57 PM

## 2019-11-29 ENCOUNTER — Ambulatory Visit: Payer: PRIVATE HEALTH INSURANCE

## 2019-11-29 ENCOUNTER — Ambulatory Visit: Payer: PRIVATE HEALTH INSURANCE | Admitting: Pharmacist

## 2019-11-29 DIAGNOSIS — R635 Abnormal weight gain: Secondary | ICD-10-CM

## 2019-11-29 DIAGNOSIS — Z6834 Body mass index (BMI) 34.0-34.9, adult: Secondary | ICD-10-CM

## 2019-11-29 DIAGNOSIS — K219 Gastro-esophageal reflux disease without esophagitis: Secondary | ICD-10-CM

## 2019-11-29 MED ORDER — SAXENDA 18 MG/3ML ~~LOC~~ SOPN: PEN_INJECTOR | SUBCUTANEOUS | 3 refills | Status: DC

## 2019-11-29 MED ORDER — PEN NEEDLES 32G X 4 MM MISC: 3 refills | Status: DC

## 2019-11-29 NOTE — Progress Notes (Signed)
I have reviewed the above note and agree. I was available to the pharmacist for consultation.  Lenton Gendreau, MD 

## 2019-11-29 NOTE — Patient Instructions (Signed)
Visit Information  Goals Addressed              This Visit's Progress     Patient Stated   .  PharmD "I need help losing weight" (pt-stated)        CARE PLAN ENTRY (see longitudinal plan of care for additional care plan information)  Current Barriers:  . Social, financial, community barriers:  o Reports that historically, she has been able to lose weight by cutting back on sweets, but over the past few months with menopause, she has not been able to lose weight. She is frustrated.  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 Obesity; complicated by chronic medical conditions including GERD, family hx ASCVD, most recent BMI 34.25 (weight 205.8 lbs), previous BMI 33.28 (weight 200 lbs) three months ago . Current meal patterns: o Breakfast: generally skipping breakfast because she works late, so sleeps late o Lunch: Leftovers at [employer], though often eating out. Ex: sushi yesterday, chicken, burgers; avoids high carb options like pizza;  o Supper: leftovers from lunch; sometimes take out; usually quick food, not take out (sit down meals) o Snacks: hummus, multigrain rice crackers; cheese  o Drinks: Diet coke, flavored carbonated waters;  o Desserts: husband keeps M&Ms, she will occasionally snack; MlL brings cookies and cakes  . Current exercise: stands all day long at her job, walking all day long. Busy weekends moving boxes and tables; no formal exercise . Current weight management pharmacotherapy: none  Pharmacist Clinical Goal(s):  Marland Kitchen Over the next 90 days, patient will work with PharmD and primary care provider to work towards 5-10% body weight loss  Interventions: . Comprehensive medication review performed, medication list updated in electronic medical record . Inter-disciplinary care team collaboration (see longitudinal plan of care) . Patient has not met goal of at least 5% of body weight loss with comprehensive lifestyle modifications alone in the past 3-6 months.  Pharmacotherapy is appropriate to pursue as augmentation. Discussed Saxenda vs Wegovy. Patient reports she is more likely to be adherent to daily injections. Start Saxenda 0.6 mg once daily x 7 days, then increase to 1.2 mg daily. Increase at 0.6 mg intervals weekly as tolerated to max daily dose 3 mg.  . Reviewed dietary intervention goals, such as reduction in carbohydrate portion sizes. Encouraged patient to enroll in lifestyle support w/ Saxenda Cares.  . Discussed exercise recommendation. Will continue to follow.  Patient Self Care Activities:  . Patient will adhere to dietary modifications . Patient will target at least 150 minutes of moderate intensity exercise weekly . Patient will report any questions or concerns to provider   Initial goal documentation        The patient verbalized understanding of instructions provided today and declined a print copy of patient instruction materials.  Plan: - Will follow w/ plan regarding coverage and need for PA   Catie Darnelle Maffucci, PharmD, [place], [place] Pharmacist [place] [phone]

## 2019-11-29 NOTE — Chronic Care Management (AMB) (Signed)
**Note Brianna-Identified via Obfuscation** Chronic Care Management   Note  11/29/2019 Name: Brianna [name] MRN: [id number] DOB: 1970-12-25   Subjective:  Brianna [name] is a 49 y.o. year old female who is a primary care patient of Brianna Pheasant, MD. The CCM team was consulted for assistance with chronic disease management and care coordination needs.    Contacted patient for medication management review.   Brianna [name] was given information about Chronic Care Management services today including:  1. CCM service includes personalized support from designated clinical staff supervised by her physician, including individualized plan of care and coordination with other care providers 2. 24/7 contact phone numbers for assistance for urgent and routine care needs. 3. The patient may stop CCM services at any time (effective at the end of the month) by phone call to the office staff..  Patient agreed to services and verbal consent obtained.   Review of patient status, including review of consultants reports, laboratory and other test data, was performed as part of comprehensive evaluation and provision of chronic care management services.   SDOH (Social Determinants of Health) assessments and interventions performed:  SDOH Interventions     Most Recent Value  SDOH Interventions  Food Insecurity Interventions Intervention Not Indicated  Physical Activity Interventions Other (Comments)  Stress Interventions Provide Counseling       Objective:  Lab Results  Component Value Date   CREATININE 0.73 06/01/2019   CREATININE 0.47 05/25/2018    No results found for: HGBA1C     Component Value Date/Time   CHOL 226 (H) 06/01/2019 1045   TRIG 199.0 (H) 06/01/2019 1045   HDL 59.80 06/01/2019 1045   CHOLHDL 4 06/01/2019 1045   VLDL 39.8 06/01/2019 1045   [place] 126 (H) 06/01/2019 1045    Clinical ASCVD: No  The 10-year ASCVD risk score Brianna [name] DC Jr., et al., 2013) is: 1.7%   Values used to calculate the score:     Age: 55 years      Sex: Female     Is Non-Hispanic African American: No     Diabetic: No     Tobacco smoker: No     Systolic Blood Pressure: 557 mmHg     Is BP treated: No     HDL Cholesterol: 59.8 mg/dL     Total Cholesterol: 226 mg/dL    BP Readings from Last 3 Encounters:  11/28/19 (!) 152/101  11/13/19 124/76  11/03/19 128/88     Allergies  Allergen Reactions  . Vicodin [Hydrocodone-Acetaminophen] Hives    Medications Reviewed Today    Reviewed by Brianna [name], [facility] (Pharmacist) on 11/29/19 at [place]  Med List Status: <None>  Medication Order Taking? Sig Documenting Provider Last Dose Status Informant  omeprazole (PRILOSEC) 20 MG capsule [id number] Yes Take 1 capsule (20 mg total) by mouth 2 (two) times daily before a meal. Take 1 capsule (20 mg total) two times a day for 30 days and then take 1 capsule a day for two months. Virgel Manifold, MD Taking Active            Assessment:   Goals Addressed              This Visit's Progress     Patient Stated   .  PharmD "I need help losing weight" (pt-stated)        CARE PLAN ENTRY (see longitudinal plan of care for additional care plan information)  Current Barriers:  . Social, financial, community barriers:  o Reports that historically, she has  been able to lose weight by cutting back on sweets, but over the past few months with menopause, she has not been able to lose weight. She is frustrated.  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 Obesity; complicated by chronic medical conditions including GERD, family hx ASCVD, most recent BMI 34.25 (weight 205.8 lbs), previous BMI 33.28 (weight 200 lbs) three months ago . Current meal patterns: o Breakfast: generally skipping breakfast because she works late, so sleeps late o Lunch: Leftovers at [employer], though often eating out. Ex: sushi yesterday, chicken, burgers; avoids high carb options like pizza;  o Supper: leftovers from lunch; sometimes take out; usually quick  food, not take out (sit down meals) o Snacks: hummus, multigrain rice crackers; cheese  o Drinks: Diet coke, flavored carbonated waters;  o Desserts: husband keeps M&Ms, she will occasionally snack; MlL brings cookies and cakes  . Current exercise: stands all day long at her job, walking all day long. Busy weekends moving boxes and tables; no formal exercise . Current weight management pharmacotherapy: none  Pharmacist Clinical Goal(s):  Marland Kitchen Over the next 90 days, patient will work with PharmD and primary care provider to work towards 5-10% body weight loss  Interventions: . Comprehensive medication review performed, medication list updated in electronic medical record . Inter-disciplinary care team collaboration (see longitudinal plan of care) . Patient has not met goal of at least 5% of body weight loss with comprehensive lifestyle modifications alone in the past 3-6 months. Pharmacotherapy is appropriate to pursue as augmentation. Discussed Saxenda vs Wegovy. Patient reports she is more likely to be adherent to daily injections. Start Saxenda 0.6 mg once daily x 7 days, then increase to 1.2 mg daily. Increase at 0.6 mg intervals weekly as tolerated to max daily dose 3 mg.  . Reviewed dietary intervention goals, such as reduction in carbohydrate portion sizes. Encouraged patient to enroll in lifestyle support w/ Saxenda Cares.  . Discussed exercise recommendation. Will continue to follow.  Patient Self Care Activities:  . Patient will adhere to dietary modifications . Patient will target at least 150 minutes of moderate intensity exercise weekly . Patient will report any questions or concerns to provider   Initial goal documentation        Plan: - Will follow w/ plan regarding coverage and need for PA   Catie Darnelle Maffucci, PharmD, [place], CPP Clinical Pharmacist [place] [phone]

## 2019-12-01 ENCOUNTER — Ambulatory Visit: Payer: PRIVATE HEALTH INSURANCE | Admitting: Pharmacist

## 2019-12-01 DIAGNOSIS — Z6834 Body mass index (BMI) 34.0-34.9, adult: Secondary | ICD-10-CM

## 2019-12-01 DIAGNOSIS — K219 Gastro-esophageal reflux disease without esophagitis: Secondary | ICD-10-CM

## 2019-12-01 NOTE — Chronic Care Management (AMB) (Signed)
°  Chronic Care Management   Follow Up Note   12/01/2019 Name: Brianna [name] MRN: [id number] DOB: Apr 24, 1970  Referred by: Einar Pheasant, MD Reason for referral : Chronic Care Management (Medication Management)   Brianna [name] is a 49 y.o. year old female who is a primary care patient of Einar Pheasant, MD. The CCM team was consulted for assistance with chronic disease management and care coordination needs.    Care coordination today.   Review of patient status, including review of consultants reports, relevant laboratory and other test results, and collaboration with appropriate care team members and the patient's provider was performed as part of comprehensive patient evaluation and provision of chronic care management services.    SDOH (Social Determinants of Health) assessments performed: No See Care Plan activities for detailed interventions related to [facility])     Outpatient Encounter Medications as of 12/01/2019  Medication Sig Note   APPLE CIDER VINEGAR PO Take 1 tablet by mouth daily.    Insulin Pen Needle (PEN NEEDLES) 32G X 4 MM MISC Use to inject Saxenda daily    Liraglutide -Weight Management (SAXENDA) 18 MG/3ML SOPN Inject 0.6 mg daily for 1 week, then increase to 1.2 mg daily. Increase at weekly intervals as tolerated until maximum dose of 3 mg daily.    omeprazole (PRILOSEC) 20 MG capsule Take 1 capsule (20 mg total) by mouth 2 (two) times daily before a meal. Take 1 capsule (20 mg total) two times a day for 30 days and then take 1 capsule a day for two months. 11/29/2019: BID   No facility-administered encounter medications on file as of 12/01/2019.     Objective:   Goals Addressed              This Visit's Progress     Patient Stated     PharmD "I need help losing weight" (pt-stated)        CARE PLAN ENTRY (see longitudinal plan of care for additional care plan information)  Current Barriers:   Social, financial, community barriers:  o Reports that  historically, she has been able to lose weight by cutting back on sweets, but over the past few months with menopause, she has not been able to lose weight. She is frustrated.  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 Busy weekends moving boxes and tables; no formal exercise  Current weight management pharmacotherapy: none  Pharmacist Clinical Goal(s):   Over the next 90 days, patient will work with PharmD and primary care provider to work towards 5-10% body weight loss  Interventions:  Received PA denial. Completed appeal and submitted to patient's insurance.  Patient Self Care Activities:   Patient will adhere to dietary modifications  Patient will target at least 150 minutes of moderate intensity exercise weekly  Patient will report any questions or concerns to provider   Please see past updates related to this goal by clicking on the "Past Updates" button in the selected goal          Plan:  - Will f/u with patient's insurance regarding PA status.   Catie Darnelle Maffucci, PharmD, [place], CPP Clinical Pharmacist [place] [phone]

## 2019-12-01 NOTE — Patient Instructions (Signed)
Visit Information  Goals Addressed              This Visit's Progress     Patient Stated   .  PharmD "I need help losing weight" (pt-stated)        CARE PLAN ENTRY (see longitudinal plan of care for additional care plan information)  Current Barriers:  . Social, financial, community barriers:  o Reports that historically, she has been able to lose weight by cutting back on sweets, but over the past few months with menopause, she has not been able to lose weight. She is frustrated.  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 Obesity; complicated by chronic medical conditions including GERD, family hx ASCVD, most recent BMI 34.25 (weight 205.8 lbs), previous BMI 33.28 (weight 200 lbs) three months ago . Current exercise: stands all day long at her job, walking all day long. Busy weekends moving boxes and tables; no formal exercise . Current weight management pharmacotherapy: none  Pharmacist Clinical Goal(s):  Marland Kitchen Over the next 90 days, patient will work with PharmD and primary care provider to work towards 5-10% body weight loss  Interventions: . Received PA denial. Completed appeal and submitted to patient's insurance.  Patient Self Care Activities:  . Patient will adhere to dietary modifications . Patient will target at least 150 minutes of moderate intensity exercise weekly . Patient will report any questions or concerns to provider   Please see past updates related to this goal by clicking on the "Past Updates" button in the selected goal         The patient verbalized understanding of instructions provided today and declined a print copy of patient instruction materials.   Plan:  - Will f/u with patient's insurance regarding PA status.   Catie Darnelle Maffucci, PharmD, [place], CPP Clinical Pharmacist [place] [phone]

## 2019-12-02 NOTE — Progress Notes (Signed)
I have reviewed the above note and agree. I was available to the pharmacist for consultation.  Faatimah Spielberg, MD 

## 2019-12-04 ENCOUNTER — Encounter (INDEPENDENT_AMBULATORY_CARE_PROVIDER_SITE_OTHER): Payer: Self-pay | Admitting: Vascular Surgery

## 2019-12-07 ENCOUNTER — Ambulatory Visit: Payer: PRIVATE HEALTH INSURANCE | Admitting: Pharmacist

## 2019-12-07 DIAGNOSIS — Z6834 Body mass index (BMI) 34.0-34.9, adult: Secondary | ICD-10-CM

## 2019-12-07 NOTE — Patient Instructions (Signed)
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 PA denied via Cover My Meds, completed an Appeal on 12/01/19. Received fax 8/16 to fill out PA request form for Saxenda.   Obesity; complicated by chronic medical conditions including GERD, family hx ASCVD, most recent BMI 34.25 (weight 205.8 lbs), previous BMI 33.28 (weight 200 lbs) three months ago  Current exercise: stands all day long at her job, walking all day long. Busy weekends moving boxes and tables; no formal exercise  Current weight management pharmacotherapy: none  Pharmacist Clinical Goal(s):   Over the next 90 days, patient will work with PharmD and primary care provider to work towards 5-10% body weight loss  Interventions:  Completed paperwork that was faxed by [employer]. Updated patient. Will f/u with insurance next week if I have not heard back.   Patient Self Care Activities:   Patient will adhere to dietary modifications  Patient will target at least 150 minutes of moderate intensity exercise weekly  Patient will report any questions or concerns to provider   Please see past updates related to this goal by clicking on the "Past Updates" button in the selected goal         The patient verbalized understanding of instructions provided today and declined a print copy of patient instruction materials.   Plan:  - Will f/u with insurance next week if I do not hear back.   Catie Darnelle Maffucci, PharmD, [place], CPP Clinical Pharmacist [place] [phone]

## 2019-12-07 NOTE — Chronic Care Management (AMB) (Signed)
  Chronic Care Management   Follow Up Note   12/07/2019 Name: Brianna [name] MRN: [id number] DOB: 1971-03-21  Referred by: Einar Pheasant, MD Reason for referral : Chronic Care Management (Medication Management)   Brianna [name] is a 49 y.o. year old female who is a primary care patient of Einar Pheasant, MD. The CCM team was consulted for assistance with chronic disease management and care coordination needs.    Care coordination today.   Review of patient status, including review of consultants reports, relevant laboratory and other test results, and collaboration with appropriate care team members and the patient's provider was performed as part of comprehensive patient evaluation and provision of chronic care management services.    SDOH (Social Determinants of Health) assessments performed: No See Care Plan activities for detailed interventions related to [facility])     Outpatient Encounter Medications as of 12/07/2019  Medication Sig Note  . APPLE CIDER VINEGAR PO Take 1 tablet by mouth daily.   . Insulin Pen Needle (PEN NEEDLES) 32G X 4 MM MISC Use to inject Saxenda daily   . Liraglutide -Weight Management (SAXENDA) 18 MG/3ML SOPN Inject 0.6 mg daily for 1 week, then increase to 1.2 mg daily. Increase at weekly intervals as tolerated until maximum dose of 3 mg daily.   Marland Kitchen omeprazole (PRILOSEC) 20 MG capsule Take 1 capsule (20 mg total) by mouth 2 (two) times daily before a meal. Take 1 capsule (20 mg total) two times a day for 30 days and then take 1 capsule a day for two months. 11/29/2019: BID   No facility-administered encounter medications on file as of 12/07/2019.     Objective:   Goals Addressed              This Visit's Progress     Patient Stated   .  PharmD "I need help losing weight" (pt-stated)        CARE PLAN ENTRY (see longitudinal plan of care for additional care plan information)  Current Barriers:  . 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 PA denied via Cover My Meds, completed an Appeal on 12/01/19. Received fax 8/16 to fill out PA request form for Saxenda.  . Obesity; complicated by chronic medical conditions including GERD, family hx ASCVD, most recent BMI 34.25 (weight 205.8 lbs), previous BMI 33.28 (weight 200 lbs) three months ago . Current exercise: stands all day long at her job, walking all day long. Busy weekends moving boxes and tables; no formal exercise . Current weight management pharmacotherapy: none  Pharmacist Clinical Goal(s):  Marland Kitchen Over the next 90 days, patient will work with PharmD and primary care provider to work towards 5-10% body weight loss  Interventions: . Completed paperwork that was faxed by insurance company. Updated patient. Will f/u with insurance next week if I have not heard back.   Patient Self Care Activities:  . Patient will adhere to dietary modifications . Patient will target at least 150 minutes of moderate intensity exercise weekly . Patient will report any questions or concerns to provider   Please see past updates related to this goal by clicking on the "Past Updates" button in the selected goal          Plan:  - Will f/u with insurance next week if I do not hear back.   Brianna [name], PharmD, [place], CPP Clinical Pharmacist [place] [phone]

## 2019-12-07 NOTE — Progress Notes (Signed)
I have reviewed the above note and agree. I was available to the pharmacist for consultation.  Hermena Swint, MD 

## 2019-12-13 ENCOUNTER — Ambulatory Visit: Payer: PRIVATE HEALTH INSURANCE | Admitting: Pharmacist

## 2019-12-13 DIAGNOSIS — Z6834 Body mass index (BMI) 34.0-34.9, adult: Secondary | ICD-10-CM

## 2019-12-13 NOTE — Chronic Care Management (AMB) (Signed)
  Chronic Care Management   Follow Up Note   12/13/2019 Name: Bobi Daudelin MRN: [id number] DOB: [birth year]  Referred by: Einar Pheasant, MD Reason for referral : Chronic Care Management (Medication Management)   Karelyn Brisby is a 49 y.o. year old female who is a primary care patient of Einar Pheasant, MD. The CCM team was consulted for assistance with chronic disease management and care coordination needs.   Care coordination completed today.  Review of patient status, including review of consultants reports, relevant laboratory and other test results, and collaboration with appropriate care team members and the patient's provider was performed as part of comprehensive patient evaluation and provision of chronic care management services.    SDOH (Social Determinants of Health) assessments performed: No See Care Plan activities for detailed interventions related to [facility])     Outpatient Encounter Medications as of 12/13/2019  Medication Sig Note  . APPLE CIDER VINEGAR PO Take 1 tablet by mouth daily.   . Insulin Pen Needle (PEN NEEDLES) 32G X 4 MM MISC Use to inject Saxenda daily   . Liraglutide -Weight Management (SAXENDA) 18 MG/3ML SOPN Inject 0.6 mg daily for 1 week, then increase to 1.2 mg daily. Increase at weekly intervals as tolerated until maximum dose of 3 mg daily.   Marland Kitchen omeprazole (PRILOSEC) 20 MG capsule Take 1 capsule (20 mg total) by mouth 2 (two) times daily before a meal. Take 1 capsule (20 mg total) two times a day for 30 days and then take 1 capsule a day for two months. 11/29/2019: BID   No facility-administered encounter medications on file as of 12/13/2019.     Objective:   Goals Addressed              This Visit's Progress     Patient Stated   .  PharmD "I need help losing weight" (pt-stated)        CARE PLAN ENTRY (see longitudinal plan of care for additional care plan information)  Current Barriers:  . Social, financial, community barriers:  o Following  up on Psychologist, forensic for [employer] . Obesity; complicated by chronic medical conditions including GERD, family hx ASCVD, most recent BMI 34.25 (weight 205.8 lbs), previous BMI 33.28 (weight 200 lbs) three months ago . Current exercise: stands all day long at her job, walking all day long. Busy weekends moving boxes and tables; no formal exercise . Current weight management pharmacotherapy: none  Pharmacist Clinical Goal(s):  Marland Kitchen Over the next 90 days, patient will work with PharmD and primary care provider to work towards 5-10% body weight loss  Interventions: . Contacted [employer]. They did not receive appeal that I faxed last week. Received new fax number, faxed appeal paperwork. Will follow up later this week.   Patient Self Care Activities:  . Patient will adhere to dietary modifications . Patient will target at least 150 minutes of moderate intensity exercise weekly . Patient will report any questions or concerns to provider   Please see past updates related to this goal by clicking on the "Past Updates" button in the selected goal          Plan:  - Will f/u on appeal in 5-7 business days  Catie Darnelle Maffucci, PharmD, [place], [place] Pharmacist [facility] [phone]

## 2019-12-13 NOTE — Patient Instructions (Signed)
Visit Information  Goals Addressed              This Visit's Progress     Patient Stated   .  PharmD "I need help losing weight" (pt-stated)        CARE PLAN ENTRY (see longitudinal plan of care for additional care plan information)  Current Barriers:  . Social, financial, community barriers:  o Following up on Psychologist, forensic for [employer] . Obesity; complicated by chronic medical conditions including GERD, family hx ASCVD, most recent BMI 34.25 (weight 205.8 lbs), previous BMI 33.28 (weight 200 lbs) three months ago . Current exercise: stands all day long at her job, walking all day long. Busy weekends moving boxes and tables; no formal exercise . Current weight management pharmacotherapy: none  Pharmacist Clinical Goal(s):  Marland Kitchen Over the next 90 days, patient will work with PharmD and primary care provider to work towards 5-10% body weight loss  Interventions: . Contacted [employer]. They did not receive appeal that I faxed last week. Received new fax number, faxed appeal paperwork. Will follow up later this week.   Patient Self Care Activities:  . Patient will adhere to dietary modifications . Patient will target at least 150 minutes of moderate intensity exercise weekly . Patient will report any questions or concerns to provider   Please see past updates related to this goal by clicking on the "Past Updates" button in the selected goal         The patient verbalized understanding of instructions provided today and declined a print copy of patient instruction materials.   Plan:  - Will f/u on appeal in 5-7 business days  Catie Darnelle Maffucci, PharmD, [place], [place] Pharmacist [place] [phone]

## 2019-12-14 NOTE — Progress Notes (Signed)
I have reviewed the above note and agree. I was available to the pharmacist for consultation.  Mishael Krysiak, MD 

## 2019-12-16 ENCOUNTER — Ambulatory Visit: Payer: PRIVATE HEALTH INSURANCE | Admitting: Pharmacist

## 2019-12-16 DIAGNOSIS — Z6834 Body mass index (BMI) 34.0-34.9, adult: Secondary | ICD-10-CM

## 2019-12-16 DIAGNOSIS — K219 Gastro-esophageal reflux disease without esophagitis: Secondary | ICD-10-CM

## 2019-12-16 NOTE — Patient Instructions (Signed)
Visit Information  Goals Addressed              This Visit's Progress     Patient Stated   .  PharmD "I need help losing weight" (pt-stated)        CARE PLAN ENTRY (see longitudinal plan of care for additional care plan information)  Current Barriers:  . Social, financial, community barriers:  o Psychologist, counselling [phone] to follow up on appeal. . Obesity; complicated by chronic medical conditions including GERD, family hx ASCVD, most recent BMI 34.25 (weight 205.8 lbs), previous BMI 33.28 (weight 200 lbs) three months ago . Current exercise: stands all day long at her job, walking all day long. Busy weekends moving boxes and tables; no formal exercise . Current weight management pharmacotherapy: none  Pharmacist Clinical Goal(s):  Marland Kitchen Over the next 90 days, patient will work with PharmD and primary care provider to work towards 5-10% body weight loss  Interventions: . Contacted [employer]. Appeal denied. Will collaborate w/ PCP to see if she would like to pursue use of Victoza/Ozempic instead, as GLP1 for weight loss specifically is not covered on patient's plan  Patient Self Care Activities:  . Patient will adhere to dietary modifications . Patient will target at least 150 minutes of moderate intensity exercise weekly . Patient will report any questions or concerns to provider   Please see past updates related to this goal by clicking on the "Past Updates" button in the selected goal         The patient verbalized understanding of instructions provided today and declined a print copy of patient instruction materials.   Plan:  - Will f/u with patient on plan from prescriber within 1-2 weeks  Catie Darnelle Maffucci, PharmD, [place], [place] Pharmacist [place] [phone]

## 2019-12-16 NOTE — Chronic Care Management (AMB) (Signed)
  Chronic Care Management   Follow Up Note   12/16/2019 Name: Brianna [name] MRN: [id number] DOB: 07/18/1970  Referred by: Einar Pheasant, MD Reason for referral : Chronic Care Management (Medication Management)   Brianna [name] is a 49 y.o. year old female who is a primary care patient of Einar Pheasant, MD. The CCM team was consulted for assistance with chronic disease management and care coordination needs.    Review of patient status, including review of consultants reports, relevant laboratory and other test results, and collaboration with appropriate care team members and the patient's provider was performed as part of comprehensive patient evaluation and provision of chronic care management services.    SDOH (Social Determinants of Health) assessments performed: No See Care Plan activities for detailed interventions related to [facility])     Outpatient Encounter Medications as of 12/16/2019  Medication Sig Note  . APPLE CIDER VINEGAR PO Take 1 tablet by mouth daily.   . Insulin Pen Needle (PEN NEEDLES) 32G X 4 MM MISC Use to inject Saxenda daily   . Liraglutide -Weight Management (SAXENDA) 18 MG/3ML SOPN Inject 0.6 mg daily for 1 week, then increase to 1.2 mg daily. Increase at weekly intervals as tolerated until maximum dose of 3 mg daily.   Marland Kitchen omeprazole (PRILOSEC) 20 MG capsule Take 1 capsule (20 mg total) by mouth 2 (two) times daily before a meal. Take 1 capsule (20 mg total) two times a day for 30 days and then take 1 capsule a day for two months. 11/29/2019: BID   No facility-administered encounter medications on file as of 12/16/2019.     Objective:   Goals Addressed              This Visit's Progress     Patient Stated   .  PharmD "I need help losing weight" (pt-stated)        CARE PLAN ENTRY (see longitudinal plan of care for additional care plan information)  Current Barriers:  . Social, financial, community barriers:  o Psychologist, counselling  [phone] to follow up on appeal. . Obesity; complicated by chronic medical conditions including GERD, family hx ASCVD, most recent BMI 34.25 (weight 205.8 lbs), previous BMI 33.28 (weight 200 lbs) three months ago . Current exercise: stands all day long at her job, walking all day long. Busy weekends moving boxes and tables; no formal exercise . Current weight management pharmacotherapy: none  Pharmacist Clinical Goal(s):  Marland Kitchen Over the next 90 days, patient will work with PharmD and primary care provider to work towards 5-10% body weight loss  Interventions: . Contacted [employer]. Appeal denied. Will collaborate w/ PCP to see if she would like to pursue use of Victoza/Ozempic instead, as GLP1 for weight loss specifically is not covered on patient's plan  Patient Self Care Activities:  . Patient will adhere to dietary modifications . Patient will target at least 150 minutes of moderate intensity exercise weekly . Patient will report any questions or concerns to provider   Please see past updates related to this goal by clicking on the "Past Updates" button in the selected goal          Plan:  - Will f/u with patient on plan from prescriber within 1-2 weeks  Catie Darnelle Maffucci, PharmD, [place], [place] Pharmacist [place] [phone]

## 2019-12-17 NOTE — Progress Notes (Signed)
I have reviewed the above note and agree. I was available to the pharmacist for consultation. Will d/w Catie regarding ozempic/victoza - treatment.   Einar Pheasant, MD

## 2019-12-21 ENCOUNTER — Ambulatory Visit: Payer: PRIVATE HEALTH INSURANCE | Admitting: Pharmacist

## 2019-12-21 DIAGNOSIS — Z6834 Body mass index (BMI) 34.0-34.9, adult: Secondary | ICD-10-CM

## 2019-12-21 NOTE — Chronic Care Management (AMB) (Signed)
Chronic Care Management   Follow Up Note   12/21/2019 Name: Brianna [name] MRN: [id number] DOB: [birth year]  Referred by: Einar Pheasant, MD Reason for referral : Chronic Care Management (Medication Management)   Brianna [name] is a 49 y.o. year old female who is a primary care patient of Einar Pheasant, MD. The CCM team was consulted for assistance with chronic disease management and care coordination needs.    Care coordination completed today.  Review of patient status, including review of consultants reports, relevant laboratory and other test results, and collaboration with appropriate care team members and the patient's provider was performed as part of comprehensive patient evaluation and provision of chronic care management services.    SDOH (Social Determinants of Health) assessments performed: Yes See Care Plan activities for detailed interventions related to SDOH)  SDOH Interventions     Most Recent Value  SDOH Interventions  Financial Strain Interventions Intervention Not Indicated       Outpatient Encounter Medications as of 12/21/2019  Medication Sig Note   APPLE CIDER VINEGAR PO Take 1 tablet by mouth daily.    Insulin Pen Needle (PEN NEEDLES) 32G X 4 MM MISC Use to inject Saxenda daily    Liraglutide -Weight Management (SAXENDA) 18 MG/3ML SOPN Inject 0.6 mg daily for 1 week, then increase to 1.2 mg daily. Increase at weekly intervals as tolerated until maximum dose of 3 mg daily.    omeprazole (PRILOSEC) 20 MG capsule Take 1 capsule (20 mg total) by mouth 2 (two) times daily before a meal. Take 1 capsule (20 mg total) two times a day for 30 days and then take 1 capsule a day for two months. 11/29/2019: BID   No facility-administered encounter medications on file as of 12/21/2019.     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 Received notification today from insurance that appeal was approved.  Obesity; complicated by chronic medical conditions including GERD, family hx ASCVD, most recent BMI 34.25 (weight 205.8 lbs), previous BMI 33.28 (weight 200 lbs) three months ago  Current exercise: stands all day long at her job, walking all day long. Busy weekends moving boxes and tables; no formal exercise  Current weight management pharmacotherapy: none, prescribed Saxenda  Pharmacist Clinical Goal(s):   Over the next 90 days, patient will work with PharmD and primary care provider to work towards 5-10% body weight loss  Interventions:  Comprehensive medication review performed, medication list updated in electronic medical record  Inter-disciplinary care team collaboration (see longitudinal plan of care)  Start Saxenda 0.6 mg once weekly for 1 week, then increase at pre-specified 0.6 mg intervals on a weekly basis as tolerated to max daily dose of 3 mg. Counseled patient that she can hold at a dose for as long as needed pending GI tolerability.   Patient Self Care Activities:   Patient will adhere to dietary modifications  Patient will target at least 150 minutes of moderate intensity exercise weekly  Patient will report any questions or concerns to provider   Please see past updates related to this goal by clicking on the "Past Updates" button  in the selected goal          Plan:  - Scheduled f/u call in ~ 6 weeks  Catie Darnelle Maffucci, PharmD, [place], [place] Pharmacist [place] [phone]

## 2019-12-21 NOTE — Progress Notes (Signed)
I have reviewed the above note and agree. I was available to the pharmacist for consultation.  Douglas Rooks, MD 

## 2019-12-21 NOTE — Patient Instructions (Signed)
Visit Information  Goals Addressed              This Visit's Progress     Patient Stated   .  PharmD "I need help losing weight" (pt-stated)        CARE PLAN ENTRY (see longitudinal plan of care for additional care plan information)  Current Barriers:  . 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 Received notification today from insurance that appeal was approved. . Obesity; complicated by chronic medical conditions including GERD, family hx ASCVD, most recent BMI 34.25 (weight 205.8 lbs), previous BMI 33.28 (weight 200 lbs) three months ago . Current exercise: stands all day long at her job, walking all day long. Busy weekends moving boxes and tables; no formal exercise . Current weight management pharmacotherapy: none, prescribed [place]):  Marland Kitchen Over the next 90 days, patient will work with PharmD and primary care provider to work towards 5-10% body weight loss  Interventions: . Comprehensive medication review performed, medication list updated in electronic medical record . Inter-disciplinary care team collaboration (see longitudinal plan of care) . Start Saxenda 0.6 mg once weekly for 1 week, then increase at pre-specified 0.6 mg intervals on a weekly basis as tolerated to max daily dose of 3 mg. Counseled patient that she can hold at a dose for as long as needed pending GI tolerability.   Patient Self Care Activities:  . Patient will adhere to dietary modifications . Patient will target at least 150 minutes of moderate intensity exercise weekly . Patient will report any questions or concerns to provider   Please see past updates related to this goal by clicking on the "Past Updates" button in the selected goal         The patient verbalized understanding of instructions provided today and declined a print copy of patient instruction materials.   Plan:  - Scheduled f/u call in ~ 6 weeks  Catie Darnelle Maffucci,  PharmD, [place], [place] Pharmacist [place] [phone]

## 2019-12-30 IMAGING — MG DIGITAL DIAGNOSTIC BILATERAL MAMMOGRAM WITH TOMO AND CAD
8 series · 8 of 24 positions shown · non-contrast
Comparison: Previous exam(s).

CLINICAL DATA: Two palpable lumps felt in the left breast by the
patient's physician.

EXAM:
DIGITAL DIAGNOSTIC BILATERAL MAMMOGRAM WITH CAD AND TOMO
ULTRASOUND LEFT BREAST

[L CC synth-2D]
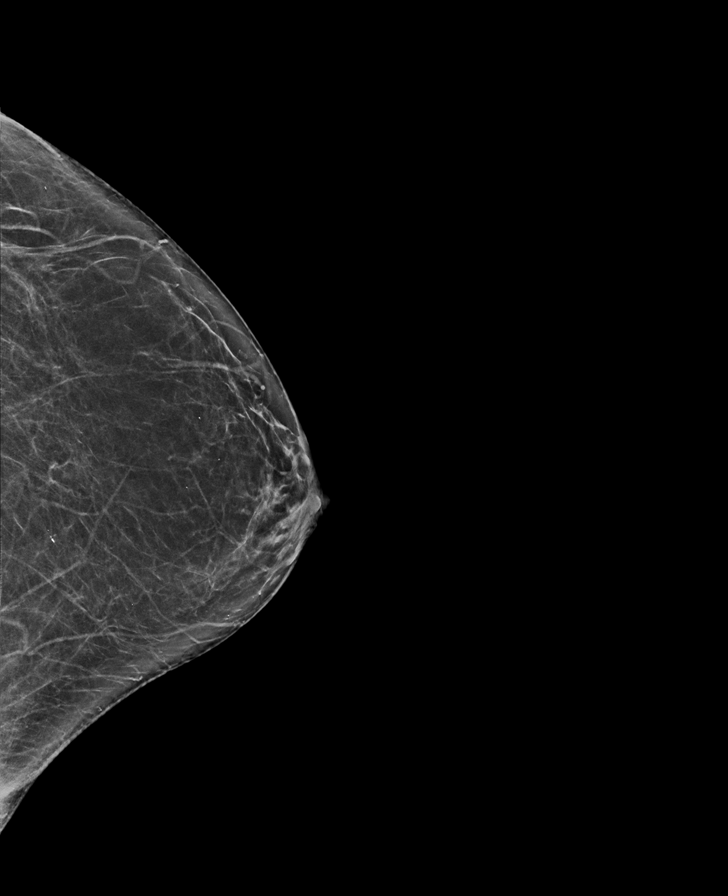

[R CC synth-2D]
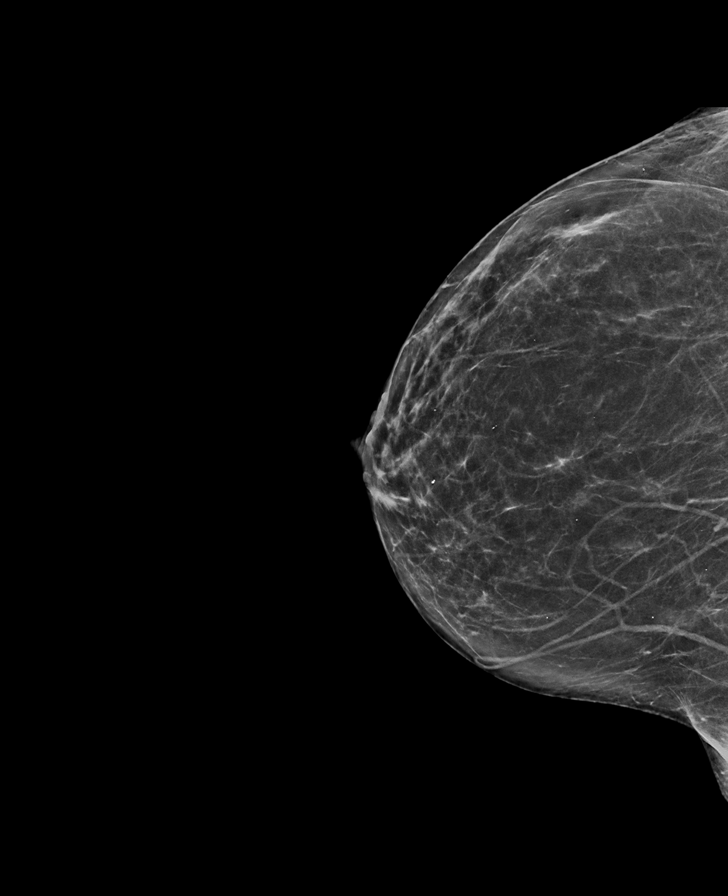

[R MLO synth-2D]
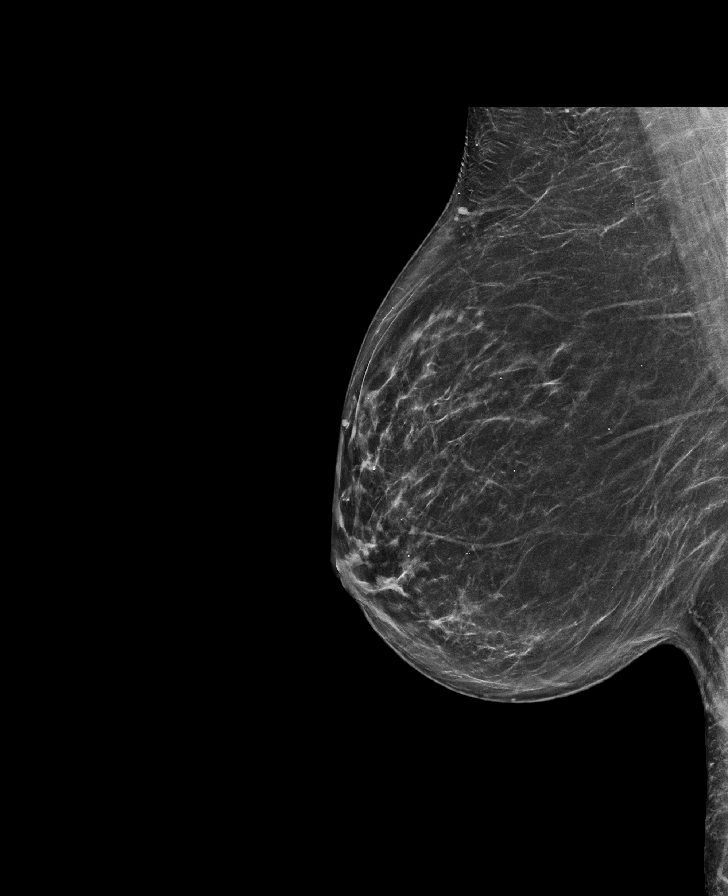

[L MLO synth-2D]
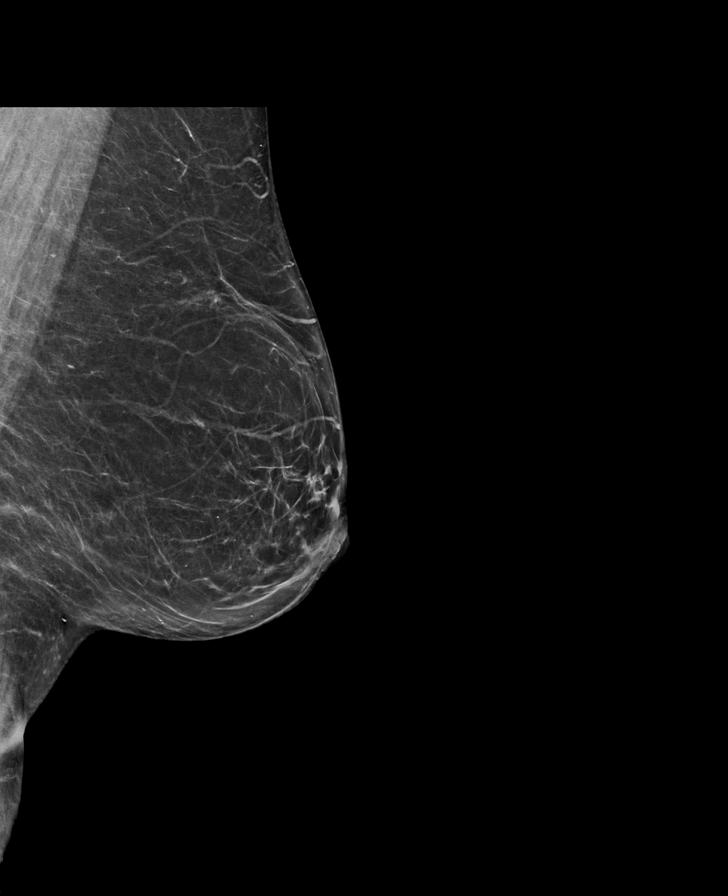

[L CC tomo · tomo slice 33/65.0]
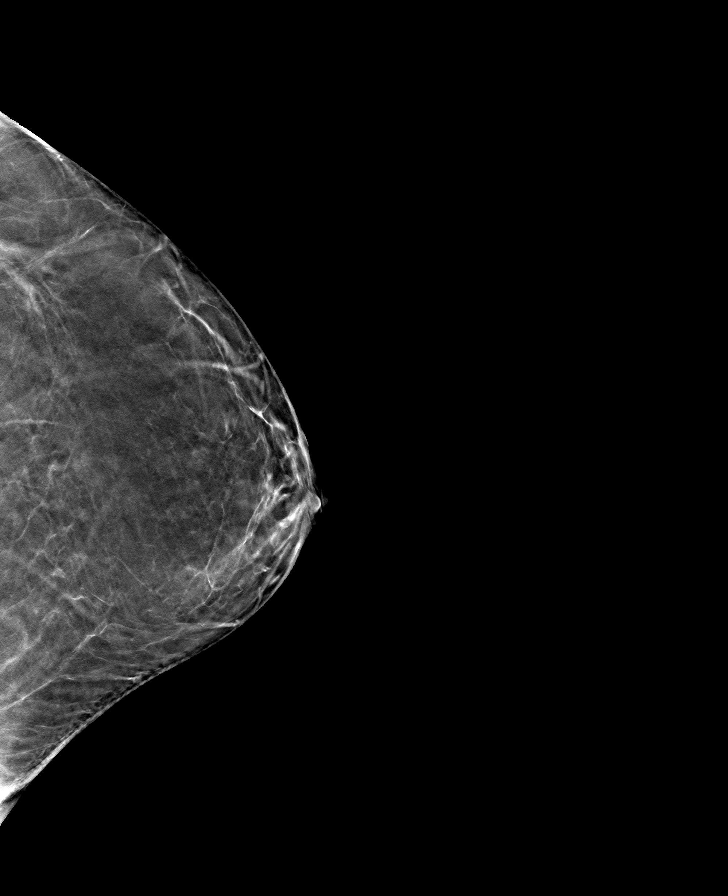

[R MLO tomo · tomo slice 38/75.0]
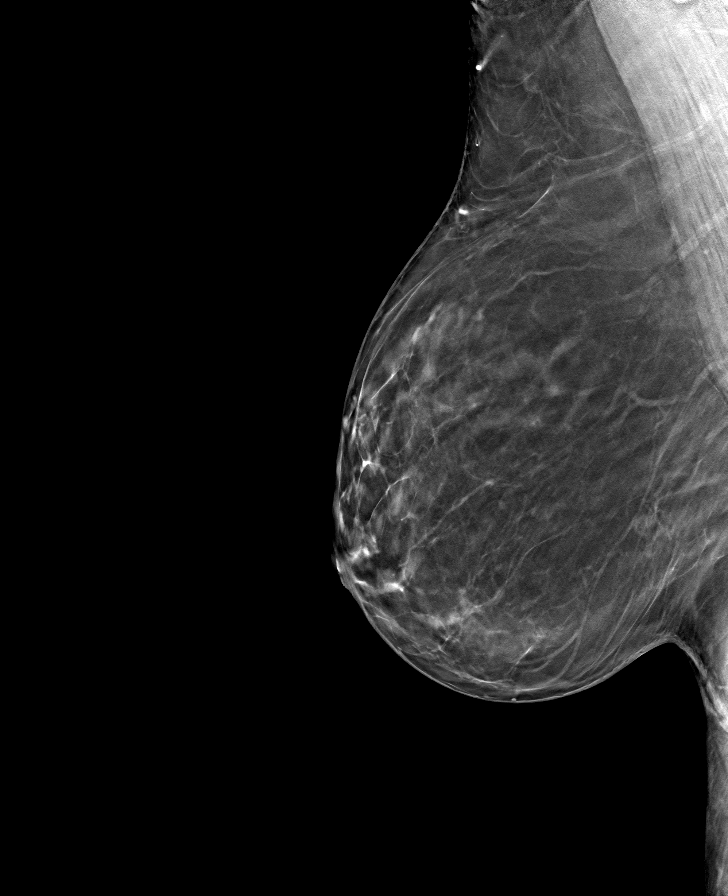

[L MLO tomo · tomo slice 37/73.0]
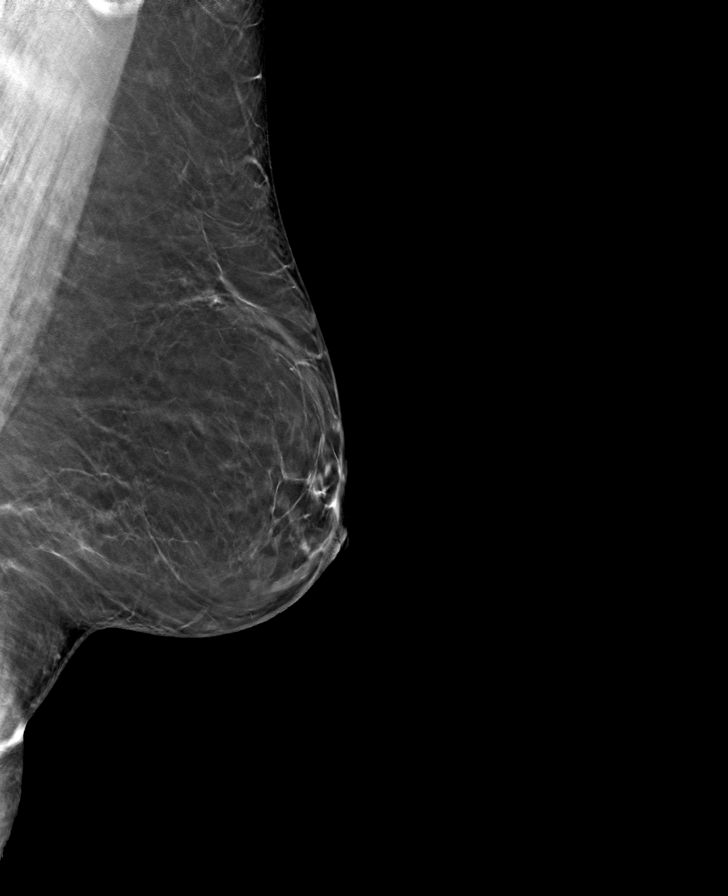

[R CC tomo · tomo slice 35/69.0]
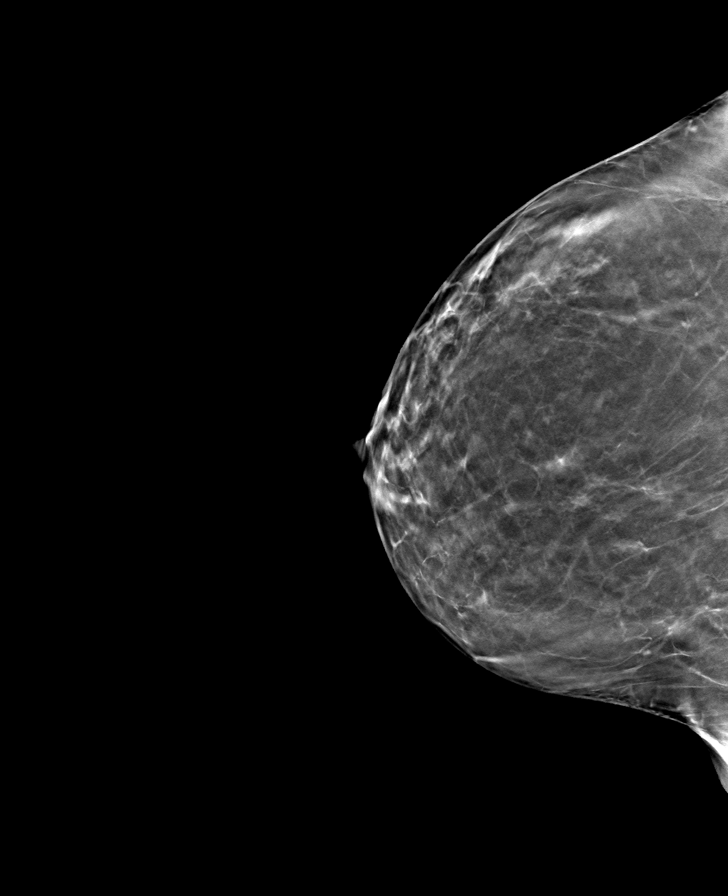

[8 of 24 positions shown; findings below may reference images not displayed]

ACR Breast Density Category b: There are scattered areas of
fibroglandular density.
FINDINGS: No suspicious masses, calcifications, or distortion in either
breast.

Mammographic images were processed with CAD.

On physical exam, no suspicious lumps are identified.

Targeted ultrasound is performed, showing no sonographic
abnormalities in the left breast to correlate with the palpable
lumps felt by the patient's physician.
IMPRESSION: No mammographic or sonographic evidence of malignancy.

RECOMMENDATION:
Annual screening mammography.

I have discussed the findings and recommendations with the patient.
Results were also provided in writing at the conclusion of the
visit. If applicable, a reminder letter will be sent to the patient
regarding the next appointment.

BI-RADS CATEGORY  2: Benign.

## 2020-01-02 ENCOUNTER — Encounter: Payer: Self-pay | Admitting: *Deleted

## 2020-01-17 ENCOUNTER — Other Ambulatory Visit: Payer: Self-pay

## 2020-01-17 ENCOUNTER — Encounter: Payer: Self-pay | Admitting: Obstetrics and Gynecology

## 2020-01-17 ENCOUNTER — Ambulatory Visit (INDEPENDENT_AMBULATORY_CARE_PROVIDER_SITE_OTHER): Payer: PRIVATE HEALTH INSURANCE | Admitting: Obstetrics and Gynecology

## 2020-01-17 DIAGNOSIS — Z1231 Encounter for screening mammogram for malignant neoplasm of breast: Secondary | ICD-10-CM

## 2020-01-17 DIAGNOSIS — R232 Flushing: Secondary | ICD-10-CM

## 2020-01-17 DIAGNOSIS — Z0000 Encounter for general adult medical examination without abnormal findings: Secondary | ICD-10-CM

## 2020-01-17 DIAGNOSIS — N760 Acute vaginitis: Secondary | ICD-10-CM | POA: Diagnosis not present

## 2020-01-17 DIAGNOSIS — L280 Lichen simplex chronicus: Secondary | ICD-10-CM

## 2020-01-17 DIAGNOSIS — F322 Major depressive disorder, single episode, severe without psychotic features: Secondary | ICD-10-CM

## 2020-01-17 DIAGNOSIS — Z01419 Encounter for gynecological examination (general) (routine) without abnormal findings: Secondary | ICD-10-CM

## 2020-01-17 MED ORDER — CLOBETASOL PROPIONATE 0.05 % EX OINT: TOPICAL_OINTMENT | CUTANEOUS | 5 refills | Status: DC

## 2020-01-17 MED ORDER — PAROXETINE HCL 10 MG PO TABS: 10.0000 mg | ORAL_TABLET | Freq: Every day | ORAL | 3 refills | Status: DC

## 2020-01-17 NOTE — Progress Notes (Signed)
Gynecology Annual Exam  PCP: Einar Pheasant, MD  Chief Complaint:  Chief Complaint  Patient presents with  . Gynecologic Exam    annual exam    History of Present Illness: Patient is a 49 y.o. [id number] presents for annual exam. The patient has no complaints today.   LMP: No LMP recorded. Patient is perimenopausal. No period since IUD placement. 3 small episodes of spotting.   Heavy Menses: no Clots: no Intermenstrual Bleeding: no Postcoital Bleeding: no Dysmenorrhea: no   The patient is sexually active. She currently uses IUD for contraception. She denies dyspareunia.  The patient does perform self breast exams.  There is notable family history of breast or ovarian cancer in her family.  The patient has regular exercise: no.    The patient reports current symptoms of depression.    Patient reports that for the last 6 months she has been feeling very depressed and anxious.  She reports that she hates herself.  She has been in a brain fog.  She forgets to do things even when she writes them down.  She recently forgot her son's birthday.  Her entire body hurts.  Her husband cannot do anything right.  She has been having hot flashes 4-5 times a day.  She reports a hot flashes are worse at night and occur 3-4 times. Difficulty sleeping. Significant work stress.    GAD-7: 18 PHQ-14  Review of Systems: Review of Systems  Constitutional: Negative for chills, fever, malaise/fatigue and weight loss.  HENT: Negative for congestion, hearing loss and sinus pain.   Eyes: Negative for blurred vision and double vision.  Respiratory: Negative for cough, sputum production, shortness of breath and wheezing.   Cardiovascular: Negative for chest pain, palpitations, orthopnea and leg swelling.  Gastrointestinal: Negative for abdominal pain, constipation, diarrhea, nausea and vomiting.  Genitourinary: Positive for frequency. Negative for dysuria, flank pain, hematuria and urgency.    Musculoskeletal: Positive for joint pain. Negative for back pain and falls.  Skin: Negative for itching and rash.  Neurological: Positive for headaches. Negative for dizziness.  Psychiatric/Behavioral: Negative for depression, substance abuse and suicidal ideas. The patient is not nervous/anxious.     Past Medical History:  Patient Active Problem List   Diagnosis Date Noted  . Dysphagia   . Barrett's esophagus without dysplasia   . Stomach irritation   . Weight gain 07/17/2019  . Varicose veins of leg with pain, bilateral 07/17/2019  . GERD (gastroesophageal reflux disease) 06/05/2019  . Menstrual changes 06/05/2019  . Encounter for screening colonoscopy 06/05/2019  . Sweating increase 06/05/2019  . Leiomyoma 06/29/2018  . Iron deficiency anemia 06/17/2018  . Restless leg 05/26/2018    Past Surgical History:  Past Surgical History:  Procedure Laterality Date  . CESAREAN SECTION  [phone]  . CHOLECYSTECTOMY  2003  . COLONOSCOPY WITH PROPOFOL N/A 08/22/2019   Procedure: COLONOSCOPY WITH PROPOFOL;  Surgeon: Virgel Manifold, MD;  Location: [facility] ENDOSCOPY;  Service: Endoscopy;  Laterality: N/A;  . ESOPHAGOGASTRODUODENOSCOPY (EGD) WITH PROPOFOL N/A 08/22/2019   Procedure: ESOPHAGOGASTRODUODENOSCOPY (EGD) WITH PROPOFOL;  Surgeon: Virgel Manifold, MD;  Location: [facility] ENDOSCOPY;  Service: Endoscopy;  Laterality: N/A;    Gynecologic History:  No LMP recorded. Patient is perimenopausal. Contraception: IUD Last Pap: Results were: 2020 NIL and HR HPV negative  Last mammogram: 202 Results were: BI-RAD II  Obstetric History: [id number]  Family History:  Family History  Problem Relation Age of Onset  . Early death Mother   . Heart  disease Mother   . Hyperlipidemia Mother   . Hypertension Mother   . Alcohol abuse Father   . Arthritis Father   . COPD Father   . Hyperlipidemia Father   . Hypertension Father   . Kidney disease Father   . Arthritis Maternal Grandmother    . Stroke Maternal Grandmother   . Hyperlipidemia Maternal Grandfather   . Breast cancer Paternal Aunt 1    Social History:  Social History   Socioeconomic History  . Marital status: Significant Other    Spouse name: Not on file  . Number of children: Not on file  . Years of education: Not on file  . Highest education level: Not on file  Occupational History  . Not on file  Tobacco Use  . Smoking status: Former Research scientist (life sciences)  . Smokeless tobacco: Never Used  Vaping Use  . Vaping Use: Never used  Substance and Sexual Activity  . Alcohol use: Yes    Comment: occasional   . Drug use: Never  . Sexual activity: Yes    Birth control/protection: Post-menopausal  Other Topics Concern  . Not on file  Social History Narrative  . Not on file   Social Determinants of Health   Financial Resource Strain: Low Risk   . Difficulty of Paying Living Expenses: Not hard at all  Food Insecurity: No Food Insecurity  . Worried About Charity fundraiser in the Last Year: Never true  . Ran Out of Food in the Last Year: Never true  Transportation Needs:   . Lack of Transportation (Medical): Not on file  . Lack of Transportation (Non-Medical): Not on file  Physical Activity: Inactive  . Days of Exercise per Week: 0 days  . Minutes of Exercise per Session: 0 min  Stress: No Stress Concern Present  . Feeling of Stress : Only a little  Social Connections:   . Frequency of Communication with Friends and Family: Not on file  . Frequency of Social Gatherings with Friends and Family: Not on file  . Attends Religious Services: Not on file  . Active Member of Clubs or Organizations: Not on file  . Attends Archivist Meetings: Not on file  . Marital Status: Not on file  Intimate Partner Violence:   . Fear of Current or Ex-Partner: Not on file  . Emotionally Abused: Not on file  . Physically Abused: Not on file  . Sexually Abused: Not on file    Allergies:  Allergies  Allergen Reactions   . Vicodin [Hydrocodone-Acetaminophen] Hives    Medications: Prior to Admission medications   Medication Sig Start Date End Date Taking? Authorizing Provider  APPLE CIDER VINEGAR PO Take 1 tablet by mouth daily.   Yes [provider]  Insulin Pen Needle (PEN NEEDLES) 32G X 4 MM MISC Use to inject Saxenda daily 11/29/19  Yes Einar Pheasant, MD  Liraglutide -Weight Management (SAXENDA) 18 MG/3ML SOPN Inject 0.6 mg daily for 1 week, then increase to 1.2 mg daily. Increase at weekly intervals as tolerated until maximum dose of 3 mg daily. 11/29/19  Yes Einar Pheasant, MD  omeprazole (PRILOSEC) 20 MG capsule Take 1 capsule (20 mg total) by mouth 2 (two) times daily before a meal. Take 1 capsule (20 mg total) two times a day for 30 days and then take 1 capsule a day for two months. 09/05/19  Yes Tahiliani, Lennette Bihari, MD  clobetasol ointment (TEMOVATE) 0.05 % Apply to affected area twice a day for 4 weeks,  then every night for 4 weeks and then twice a week for 4 weeks or until resolution. 01/17/20   Donterrius Santucci, Stefanie Libel, MD  PARoxetine (PAXIL) 10 MG tablet Take 1 tablet (10 mg total) by mouth daily. 01/17/20 01/16/21  Homero Fellers, MD    Physical Exam Vitals: There were no vitals taken for this visit.  General: NAD HEENT: normocephalic, anicteric Thyroid: no enlargement, no palpable nodules Pulmonary: No increased work of breathing, CTAB Cardiovascular: RRR, distal pulses 2+ Breast: Breast symmetrical, no tenderness, no palpable nodules or masses, no skin or nipple retraction present, no nipple discharge.  No axillary or supraclavicular lymphadenopathy. Abdomen: NABS, soft, non-tender, non-distended.  Umbilicus without lesions.  No hepatomegaly, splenomegaly or masses palpable. No evidence of hernia  Genitourinary:  External: Normal external female genitalia.  Normal urethral meatus, normal Bartholin's and Skene's glands.    Vagina: Normal vaginal mucosa, no evidence of prolapse.     Cervix: Grossly normal in appearance, no bleeding  Uterus: enlarged 12 cm, small fibroid likely, mobile, normal contour.  No CMT  Adnexa: ovaries non-enlarged, no adnexal masses  Rectal: deferred  Lymphatic: no evidence of inguinal lymphadenopathy Extremities: no edema, erythema, or tenderness Neurologic: Grossly intact Psychiatric: mood appropriate, affect full  Female chaperone present for pelvic and breast  portions of the physical exam    Assessment: 49 y.o. [id number] routine annual exam  Plan: Problem List Items Addressed This Visit    None    Visit Diagnoses    Annual physical exam    -  Primary   Relevant Orders   Flu Vaccine QUAD with presevative (Fluzone, Quad MDV) (Completed)   Vasomotor flushing       Relevant Orders   FSH   Estradiol   Lichen simplex chronicus       Relevant Medications   clobetasol ointment (TEMOVATE) 0.05 %   Acute vaginitis       Relevant Orders   NuSwab BV and Candida, NAA   Current severe episode of major depressive disorder without psychotic features without prior episode (HCC)       Relevant Medications   PARoxetine (PAXIL) 10 MG tablet   Other Relevant Orders   Ambulatory referral to Psychiatry      1) Mammogram - recommend yearly screening mammogram.  Mammogram Was ordered today   2) STI screening  was not offered and therefore not obtained  3) ASCCP guidelines and rational discussed.  Patient opts for every 5 years screening interval  4) Contraception - the patient is currently using  IUD.  She is happy with her current form of contraception and plans to continue  5) Colonoscopy -- Screening recommended starting at age 55 for average risk individuals, age 57 for individuals deemed at increased risk (including African Americans) and recommended to continue until age 70.  For patient age 84-85 individualized approach is recommended.  Gold standard screening is via colonoscopy, Cologuard screening is an acceptable alternative for  patient unwilling or unable to undergo colonoscopy.  "Colorectal cancer screening for average?risk adults: 2018 guideline update from the American Cancer Society"[place]: A Cancer Journal for Clinicians: Sep 17, 2016   6) Routine healthcare maintenance including cholesterol, diabetes screening discussed managed by PCP  7) Depression- will start paxil. Referral to psychiatry. Discussed cardio exercise as first line management for anxiety and depression as well. Patient has limited time for exercise.   8) Vasomotor symptoms- patient may be in menopause- will check labs. Will follow up and consider HRT.  9) Return in about 2 weeks (around 01/31/2020) for GYN visit.   [facility], [facility] 01/17/2020, 10:11 AM

## 2020-01-17 NOTE — Patient Instructions (Signed)
[facility] [place] for Calcium and Vitamin D  Age (yr) Calcium Recommended Dietary Allowance (mg/day) Vitamin D Recommended Dietary Allowance (international units/day)  9-18 1,300 600  19-50 1,000 600  51-70 1,200 600  71 and older 1,200 800  Data from Institute of Medicine. Dietary reference intakes: calcium, vitamin D. [place], [place]: [employer]; 2011.     Dr. [facility] [place], [place]     Exercising to Stay Healthy To become healthy and stay healthy, it is recommended that you do moderate-intensity and vigorous-intensity exercise. You can tell that you are exercising at a moderate intensity if your heart starts beating faster and you start breathing faster but can still hold a conversation. You can tell that you are exercising at a vigorous intensity if you are breathing much harder and faster and cannot hold a conversation while exercising. Exercising regularly is important. It has many health benefits, such as:  Improving overall fitness, flexibility, and endurance.  Increasing bone density.  Helping with weight control.  Decreasing body fat.  Increasing muscle strength.  Reducing stress and tension.  Improving overall health. How often should I exercise? Choose an activity that you enjoy, and set realistic goals. Your health care provider can help you make an activity plan that works for you. Exercise regularly as told by your health care provider. This may include:  Doing strength training two times a week, such as: ? Lifting weights. ? Using resistance bands. ? Push-ups. ? Sit-ups. ? Yoga.  Doing a certain intensity of exercise for a given amount of time. Choose from these options: ? A total of 150 minutes of moderate-intensity exercise every week. ? A total of 75 minutes of vigorous-intensity exercise every week. ? A mix of  moderate-intensity and vigorous-intensity exercise every week. Children, pregnant women, people who have not exercised regularly, people who are overweight, and older adults may need to talk with a health care provider about what activities are safe to do. If you have a medical condition, be sure to talk with your health care provider before you start a new exercise program. What are some exercise ideas? Moderate-intensity exercise ideas include:  Walking 1 mile (1.6 km) in about 15 minutes.  Biking.  Hiking.  Golfing.  Dancing.  Water aerobics. Vigorous-intensity exercise ideas include:  Walking 4.5 miles (7.2 km) or more in about 1 hour.  Jogging or running 5 miles (8 km) in about 1 hour.  Biking 10 miles (16.1 km) or more in about 1 hour.  Lap swimming.  Roller-skating or in-line skating.  Cross-country skiing.  Vigorous competitive sports, such as football, basketball, and soccer.  Jumping rope.  Aerobic dancing. What are some everyday activities that can help me to get exercise?  [place] work, such as: ? Pushing a Conservation officer, nature. ? Raking and bagging leaves.  Washing your car.  Pushing a stroller.  Shoveling snow.  Gardening.  Washing windows or floors. How can I be more active in my day-to-day activities?  Use stairs instead of an elevator.  Take a walk during your lunch break.  If you drive, park your car farther away from your work or school.  If you take public transportation, get off one stop early and walk the rest of the way.  Stand up or walk around during all of your indoor phone calls.  Get up, stretch, and walk around every 30 minutes throughout the day.  Enjoy exercise with a  friend. Support to continue exercising will help you keep a regular routine of activity. What guidelines can I follow while exercising?  Before you start a new exercise program, talk with your health care provider.  Do not exercise so much that you hurt yourself,  feel dizzy, or get very short of breath.  Wear comfortable clothes and wear shoes with good support.  Drink plenty of water while you exercise to prevent dehydration or heat stroke.  Work out until your breathing and your heartbeat get faster. Where to find more information  U.S. Department of Health and Human Services: BondedCompany.at  Centers for Disease Control and Prevention (CDC): http://www.wolf.info/ Summary  Exercising regularly is important. It will improve your overall fitness, flexibility, and endurance.  Regular exercise also will improve your overall health. It can help you control your weight, reduce stress, and improve your bone density.  Do not exercise so much that you hurt yourself, feel dizzy, or get very short of breath.  Before you start a new exercise program, talk with your health care provider. This information is not intended to replace advice given to you by your health care provider. Make sure you discuss any questions you have with your health care provider. Document Revised: 03/20/2017 Document Reviewed: 02/26/2017 Elsevier Patient Education  [place].   Budget-Friendly Healthy Eating There are many ways to save money at the grocery store and continue to eat healthy. You can be successful if you:  Plan meals according to your budget.  Make a grocery list and only purchase food according to your grocery list.  Prepare food yourself. What are tips for following this plan?  Reading food labels  Compare food labels between brand name foods and the store brand. Often the nutritional value is the same, but the store brand is lower cost.  Look for products that do not have added sugar, fat, or salt (sodium). These often cost the same but are healthier for you. Products may be labeled as: ? Sugar-free. ? Nonfat. ? Low-fat. ? Sodium-free. ? Low-sodium.  Look for lean ground beef labeled as at least 92% lean and 8% fat. Shopping  Buy only the items on  your grocery list and go only to the areas of the store that have the items on your list.  Use coupons only for foods and brands you normally buy. Avoid buying items you wouldn't normally buy simply because they are on sale.  Check online and in newspapers for weekly deals.  Buy healthy items from the bulk bins when available, such as herbs, spices, flour, pasta, nuts, and dried fruit.  Buy fruits and vegetables that are in season. Prices are usually lower on in-season produce.  Look at the unit price on the price tag. Use it to compare different brands and sizes to find out which item is the best deal.  Choose healthy items that are often low-cost, such as carrots, potatoes, apples, bananas, and oranges. Dried or canned beans are a low-cost protein source.  Buy in bulk and freeze extra food. Items you can buy in bulk include meats, fish, poultry, frozen fruits, and frozen vegetables.  Avoid buying "ready-to-eat" foods, such as pre-cut fruits and vegetables and pre-made salads.  If possible, shop around to discover where you can find the best prices. Consider other retailers such as dollar stores, larger [employer], local fruit and vegetable stands, and farmers markets.  Do not shop when you are hungry. If you shop while hungry, it may be  hard to stick to your list and budget.  Resist impulse buying. Use your grocery list as your official plan for the week.  Buy a variety of vegetables and fruits by purchasing fresh, frozen, and canned items.  Look at the top and bottom shelves for deals. Foods at eye level (eye level of an adult or child) are usually more expensive.  Be efficient with your time when shopping. The more time you spend at the store, the more money you are likely to spend.  To save money when choosing more expensive foods like meats and dairy: ? Choose cheaper cuts of meat, such as bone-in chicken thighs and drumsticks instead of skinless and boneless chicken. When  you are ready to prepare the chicken, you can remove the skin yourself to make it healthier. ? Choose lean meats like chicken or [place] instead of beef. ? Choose canned seafood, such as tuna, salmon, or sardines. ? Buy eggs as a low-cost source of protein. ? Buy dried beans and peas, such as lentils, split peas, or kidney beans instead of meats. Dried beans and peas are a good alternative source of protein. ? Buy the larger tubs of yogurt instead of individual-sized containers.  Choose water instead of sodas and other sweetened beverages.  Avoid buying chips, cookies, and other "junk food." These items are usually expensive and not healthy. Cooking  Make extra food and freeze the extras in meal-sized containers or in individual portions for fast meals and snacks.  Pre-cook on days when you have extra time to prepare meals in advance. You can keep these meals in the fridge or freezer and reheat for a quick meal.  When you come home from the grocery store, wash, peel, and cut fruits and vegetables so they are ready to use and eat. This will help reduce food waste. Meal planning  Do not eat out or get fast food. Prepare food at home.  Make a grocery list and make sure to bring it with you to the store. If you have a smart phone, you could use your phone to create your shopping list.  Plan meals and snacks according to a grocery list and budget you create.  Use leftovers in your meal plan for the week.  Look for recipes where you can cook once and make enough food for two meals.  Include budget-friendly meals like stews, casseroles, and stir-fry dishes.  Try some meatless meals or try "no cook" meals like salads.  Make sure that half your plate is filled with fruits or vegetables. Choose from fresh, frozen, or canned fruits and vegetables. If eating canned, remember to rinse them before eating. This will remove any excess salt added for packaging. Summary  Eating healthy on a budget  is possible if you plan your meals according to your budget, purchase according to your budget and grocery list, and prepare food yourself.  Tips for buying more food on a limited budget include buying generic brands, using coupons only for foods you normally buy, and buying healthy items from the bulk bins when available.  Tips for buying cheaper food to replace expensive food include choosing cheaper, lean cuts of meat, and buying dried beans and peas. This information is not intended to replace advice given to you by your health care provider. Make sure you discuss any questions you have with your health care provider. Document Revised: 04/08/2017 Document Reviewed: 04/08/2017 Elsevier Patient Education  [place] protect organs, store  calcium, anchor muscles, and support the whole body. Keeping your bones strong is important, especially as you get older. You can take actions to help keep your bones strong and healthy. Why is keeping my bones healthy important?  Keeping your bones healthy is important because your body constantly replaces bone cells. Cells get old, and new cells take their place. As we age, we lose bone cells because the body may not be able to make enough new cells to replace the old cells. The amount of bone cells and bone tissue you have is referred to as bone mass. The higher your bone mass, the stronger your bones. The aging process leads to an overall loss of bone mass in the body, which can increase the likelihood of:  Joint pain and stiffness.  Broken bones.  A condition in which the bones become weak and brittle (osteoporosis). A large decline in bone mass occurs in older adults. In women, it occurs about the time of menopause. What actions can I take to keep my bones healthy? Good health habits are important for maintaining healthy bones. This includes eating nutritious foods and exercising regularly. To have healthy bones, you need  to get enough of the right minerals and vitamins. Most nutrition experts recommend getting these nutrients from the foods that you eat. In some cases, taking supplements may also be recommended. Doing certain types of exercise is also important for bone health. What are the nutritional recommendations for healthy bones?  Eating a well-balanced diet with plenty of calcium and vitamin D will help to protect your bones. Nutritional recommendations vary from person to person. Ask your health care provider what is healthy for you. Here are some general guidelines. Get enough calcium Calcium is the most important (essential) mineral for bone health. Most people can get enough calcium from their diet, but supplements may be recommended for people who are at risk for osteoporosis. Good sources of calcium include:  Dairy products, such as low-fat or nonfat milk, cheese, and yogurt.  Dark green leafy vegetables, such as bok choy and broccoli.  Calcium-fortified foods, such as orange juice, cereal, bread, soy beverages, and tofu products.  Nuts, such as almonds. Follow these recommended amounts for daily calcium intake:  Children, age 56-3: 700 mg.  Children, age 47-8: 1,000 mg.  Children, age 71-13: 1,300 mg.  Teens, age 477-18: 1,300 mg.  Adults, age 31-50: 1,000 mg.  Adults, age 27-70: ? Men: 1,000 mg. ? Women: 1,200 mg.  Adults, age 47 or older: 1,200 mg.  Pregnant and breastfeeding females: ? Teens: 1,300 mg. ? Adults: 1,000 mg. Get enough vitamin D Vitamin D is the most essential vitamin for bone health. It helps the body absorb calcium. Sunlight stimulates the skin to make vitamin D, so be sure to get enough sunlight. If you live in a cold climate or you do not get outside often, your health care provider may recommend that you take vitamin D supplements. Good sources of vitamin D in your diet include:  Egg yolks.  Saltwater fish.  Milk and cereal fortified with vitamin D. Follow  these recommended amounts for daily vitamin D intake:  Children and teens, age 56-18: 600 international units.  Adults, age 82 or younger: 400-800 international units.  Adults, age 2 or older: 800-1,000 international units. Get other important nutrients Other nutrients that are important for bone health include:  Phosphorus. This mineral is found in meat, poultry, dairy foods, nuts, and legumes. The recommended daily intake for adult  men and adult women is 700 mg.  Magnesium. This mineral is found in seeds, nuts, dark green vegetables, and legumes. The recommended daily intake for adult men is 400-420 mg. For adult women, it is 310-320 mg.  Vitamin K. This vitamin is found in green leafy vegetables. The recommended daily intake is 120 mg for adult men and 90 mg for adult women. What type of physical activity is best for building and maintaining healthy bones? Weight-bearing and strength-building activities are important for building and maintaining healthy bones. Weight-bearing activities cause muscles and bones to work against gravity. Strength-building activities increase the strength of the muscles that support bones. Weight-bearing and muscle-building activities include:  Walking and hiking.  Jogging and running.  Dancing.  Gym exercises.  Lifting weights.  Tennis and racquetball.  Climbing stairs.  Aerobics. Adults should get at least 30 minutes of moderate physical activity on most days. Children should get at least 60 minutes of moderate physical activity on most days. Ask your health care provider what type of exercise is best for you. How can I find out if my bone mass is low? Bone mass can be measured with an X-ray test called a bone mineral density (BMD) test. This test is recommended for all women who are age 28 or older. It may also be recommended for:  Men who are age 38 or older.  People who are at risk for osteoporosis because of: ? Having bones that break  easily. ? Having a long-term disease that weakens bones, such as kidney disease or rheumatoid arthritis. ? Having menopause earlier than normal. ? Taking medicine that weakens bones, such as steroids, thyroid hormones, or hormone treatment for breast cancer or prostate cancer. ? Smoking. ? Drinking three or more alcoholic drinks a day. If you find that you have a low bone mass, you may be able to prevent osteoporosis or further bone loss by changing your diet and lifestyle. Where can I find more information? For more information, check out the following websites:  [place]: AviationTales.fr  [employer] of Health: www.bones.SouthExposed.es  International Osteoporosis Foundation: Administrator.iofbonehealth.org Summary  The aging process leads to an overall loss of bone mass in the body, which can increase the likelihood of broken bones and osteoporosis.  Eating a well-balanced diet with plenty of calcium and vitamin D will help to protect your bones.  Weight-bearing and strength-building activities are also important for building and maintaining strong bones.  Bone mass can be measured with an X-ray test called a bone mineral density (BMD) test. This information is not intended to replace advice given to you by your health care provider. Make sure you discuss any questions you have with your health care provider. Document Revised: 05/04/2017 Document Reviewed: 05/04/2017 Elsevier Patient Education  2020 [place] With Depression Everyone experiences occasional disappointment, sadness, and loss in their lives. When you are feeling down, blue, or sad for at least 2 weeks in a row, it may mean that you have depression. Depression can affect your thoughts and feelings, relationships, daily activities, and physical health. It is caused by changes in the way your brain functions. If you receive a diagnosis of depression, your health care provider will tell you  which type of depression you have and what treatment options are available to you. If you are living with depression, there are ways to help you recover from it and also ways to prevent it from coming back. How to cope with lifestyle changes  Coping with stress     Stress is your body's reaction to life changes and events, both good and bad. Stressful situations may include:  Getting married.  The death of a spouse.  Losing a job.  Retiring.  Having a baby. Stress can last just a few hours or it can be ongoing. Stress can play a major role in depression, so it is important to learn both how to cope with stress and how to think about it differently. Talk with your health care provider or a counselor if you would like to learn more about stress reduction. He or she may suggest some stress reduction techniques, such as:  Music therapy. This can include creating music or listening to music. Choose music that you enjoy and that inspires you.  Mindfulness-based meditation. This kind of meditation can be done while sitting or walking. It involves being aware of your normal breaths, rather than trying to control your breathing.  Centering prayer. This is a kind of meditation that involves focusing on a spiritual word or phrase. Choose a word, phrase, or sacred image that is meaningful to you and that brings you peace.  Deep breathing. To do this, expand your stomach and inhale slowly through your nose. Hold your breath for 3-5 seconds, then exhale slowly, allowing your stomach muscles to relax.  Muscle relaxation. This involves intentionally tensing muscles then relaxing them. Choose a stress reduction technique that fits your lifestyle and personality. Stress reduction techniques take time and practice to develop. Set aside 5-15 minutes a day to do them. Therapists can offer training in these techniques. The training may be covered by some insurance plans. Other things you can do to manage  stress include:  Keeping a stress diary. This can help you learn what triggers your stress and ways to control your response.  Understanding what your limits are and saying no to requests or events that lead to a schedule that is too full.  Thinking about how you respond to certain situations. You may not be able to control everything, but you can control how you react.  Adding humor to your life by watching funny films or TV shows.  Making time for activities that help you relax and not feeling guilty about spending your time this way.  Medicines Your health care provider may suggest certain medicines if he or she feels that they will help improve your condition. Avoid using alcohol and other substances that may prevent your medicines from working properly (may interact). It is also important to:  Talk with your pharmacist or health care provider about all the medicines that you take, their possible side effects, and what medicines are safe to take together.  Make it your goal to take part in all treatment decisions (shared decision-making). This includes giving input on the side effects of medicines. It is best if shared decision-making with your health care provider is part of your total treatment plan. If your health care provider prescribes a medicine, you may not notice the full benefits of it for 4-8 weeks. Most people who are treated for depression need to be on medicine for at least 6-12 months after they feel better. If you are taking medicines as part of your treatment, do not stop taking medicines without first talking to your health care provider. You may need to have the medicine slowly decreased (tapered) over time to decrease the risk of harmful side effects. Relationships Your health care provider may suggest family therapy along with  individual therapy and drug therapy. While there may not be family problems that are causing you to feel depressed, it is still important to make  sure your family learns as much as they can about your mental health. Having your family's support can help make your treatment successful. How to recognize changes in your condition Everyone has a different response to treatment for depression. Recovery from major depression happens when you have not had signs of major depression for two months. This may mean that you will start to:  Have more interest in doing activities.  Feel less hopeless than you did 2 months ago.  Have more energy.  Overeat less often, or have better or improving appetite.  Have better concentration. Your health care provider will work with you to decide the next steps in your recovery. It is also important to recognize when your condition is getting worse. Watch for these signs:  Having fatigue or low energy.  Eating too much or too little.  Sleeping too much or too little.  Feeling restless, agitated, or hopeless.  Having trouble concentrating or making decisions.  Having unexplained physical complaints.  Feeling irritable, angry, or aggressive. Get help as soon as you or your family members notice these symptoms coming back. How to get support and help from others How to talk with friends and family members about your condition  Talking to friends and family members about your condition can provide you with one way to get support and guidance. Reach out to trusted friends or family members, explain your symptoms to them, and let them know that you are working with a health care provider to treat your depression. Financial resources Not all insurance plans cover mental health care, so it is important to check with your insurance carrier. If paying for co-pays or counseling services is a problem, search for a local or county mental health care center. They may be able to offer public mental health care services at low or no cost when you are not able to see a private health care provider. If you are taking  medicine for depression, you may be able to get the generic form, which may be less expensive. Some makers of prescription medicines also offer help to patients who cannot afford the medicines they need. Follow these instructions at home:   Get the right amount and quality of sleep.  Cut down on using caffeine, tobacco, alcohol, and other potentially harmful substances.  Try to exercise, such as walking or lifting small weights.  Take over-the-counter and prescription medicines only as told by your health care provider.  Eat a healthy diet that includes plenty of vegetables, fruits, whole grains, low-fat dairy products, and lean protein. Do not eat a lot of foods that are high in solid fats, added sugars, or salt.  Keep all follow-up visits as told by your health care provider. This is important. Contact a health care provider if:  You stop taking your antidepressant medicines, and you have any of these symptoms: ? Nausea. ? Headache. ? Feeling lightheaded. ? Chills and body aches. ? Not being able to sleep (insomnia).  You or your friends and family think your depression is getting worse. Get help right away if:  You have thoughts of hurting yourself or others. If you ever feel like you may hurt yourself or others, or have thoughts about taking your own life, get help right away. You can go to your nearest emergency department or call:  Your local emergency  services (911 in the [place]  A suicide crisis helpline, such as the [place] at (260) 026-9927. This is open 24-hours a day. Summary  If you are living with depression, there are ways to help you recover from it and also ways to prevent it from coming back.  Work with your health care team to create a management plan that includes counseling, stress management techniques, and healthy lifestyle habits. This information is not intended to replace advice given to you by your health care provider.  Make sure you discuss any questions you have with your health care provider. Document Revised: 07/30/2018 Document Reviewed: 03/10/2016 Elsevier Patient Education  [place], Adult After being diagnosed with an anxiety disorder, you may be relieved to know why you have felt or behaved a certain way. You may also feel overwhelmed about the treatment ahead and what it will mean for your life. With care and support, you can manage this condition and recover from it. How to manage lifestyle changes Managing stress and anxiety  Stress is your body's reaction to life changes and events, both good and bad. Most stress will last just a few hours, but stress can be ongoing and can lead to more than just stress. Although stress can play a major role in anxiety, it is not the same as anxiety. Stress is usually caused by something external, such as a deadline, test, or competition. Stress normally passes after the triggering event has ended.  Anxiety is caused by something internal, such as imagining a terrible outcome or worrying that something will go wrong that will devastate you. Anxiety often does not go away even after the triggering event is over, and it can become long-term (chronic) worry. It is important to understand the differences between stress and anxiety and to manage your stress effectively so that it does not lead to an anxious response. Talk with your health care provider or a counselor to learn more about reducing anxiety and stress. He or she may suggest tension reduction techniques, such as:  Music therapy. This can include creating or listening to music that you enjoy and that inspires you.  Mindfulness-based meditation. This involves being aware of your normal breaths while not trying to control your breathing. It can be done while sitting or walking.  Centering prayer. This involves focusing on a word, phrase, or sacred image that means something to you and  brings you peace.  Deep breathing. To do this, expand your stomach and inhale slowly through your nose. Hold your breath for 3-5 seconds. Then exhale slowly, letting your stomach muscles relax.  Self-talk. This involves identifying thought patterns that lead to anxiety reactions and changing those patterns.  Muscle relaxation. This involves tensing muscles and then relaxing them. Choose a tension reduction technique that suits your lifestyle and personality. These techniques take time and practice. Set aside 5-15 minutes a day to do them. Therapists can offer counseling and training in these techniques. The training to help with anxiety may be covered by some insurance plans. Other things you can do to manage stress and anxiety include:  Keeping a stress/anxiety diary. This can help you learn what triggers your reaction and then learn ways to manage your response.  Thinking about how you react to certain situations. You may not be able to control everything, but you can control your response.  Making time for activities that help you relax and not feeling guilty about spending your time in  this way.  Visual imagery and yoga can help you stay calm and relax.  Medicines Medicines can help ease symptoms. Medicines for anxiety include:  Anti-anxiety drugs.  Antidepressants. Medicines are often used as a primary treatment for anxiety disorder. Medicines will be prescribed by a health care provider. When used together, medicines, psychotherapy, and tension reduction techniques may be the most effective treatment. Relationships Relationships can play a big part in helping you recover. Try to spend more time connecting with trusted friends and family members. Consider going to couples counseling, taking family education classes, or going to family therapy. Therapy can help you and others better understand your condition. How to recognize changes in your anxiety Everyone responds differently to  treatment for anxiety. Recovery from anxiety happens when symptoms decrease and stop interfering with your daily activities at home or work. This may mean that you will start to:  Have better concentration and focus. Worry will interfere less in your daily thinking.  Sleep better.  Be less irritable.  Have more energy.  Have improved memory. It is important to recognize when your condition is getting worse. Contact your health care provider if your symptoms interfere with home or work and you feel like your condition is not improving. Follow these instructions at home: Activity  Exercise. Most adults should do the following: ? Exercise for at least 150 minutes each week. The exercise should increase your heart rate and make you sweat (moderate-intensity exercise). ? Strengthening exercises at least twice a week.  Get the right amount and quality of sleep. Most adults need 7-9 hours of sleep each night. Lifestyle   Eat a healthy diet that includes plenty of vegetables, fruits, whole grains, low-fat dairy products, and lean protein. Do not eat a lot of foods that are high in solid fats, added sugars, or salt.  Make choices that simplify your life.  Do not use any products that contain nicotine or tobacco, such as cigarettes, e-cigarettes, and chewing tobacco. If you need help quitting, ask your health care provider.  Avoid caffeine, alcohol, and certain over-the-counter cold medicines. These may make you feel worse. Ask your pharmacist which medicines to avoid. General instructions  Take over-the-counter and prescription medicines only as told by your health care provider.  Keep all follow-up visits as told by your health care provider. This is important. Where to find support You can get help and support from these sources:  Self-help groups.  Online and [employer].  A trusted spiritual leader.  Couples counseling.  Family education classes.  Family  therapy. Where to find more information You may find that joining a support group helps you deal with your anxiety. The following sources can help you locate counselors or support groups near you:  [place]: www.mentalhealthamerica.net  Anxiety and Depression Association of [place] (ADAA): https://www.clark.net/  National Alliance on Mental Illness (NAMI): www.nami.org Contact a health care provider if you:  Have a hard time staying focused or finishing daily tasks.  Spend many hours a day feeling worried about everyday life.  Become exhausted by worry.  Start to have headaches, feel tense, or have nausea.  Urinate more than normal.  Have diarrhea. Get help right away if you have:  A racing heart and shortness of breath.  Thoughts of hurting yourself or others. If you ever feel like you may hurt yourself or others, or have thoughts about taking your own life, get help right away. You can go to your nearest emergency department or  call:  Your local emergency services (911 in the [place]  A suicide crisis helpline, such as the [place] at 786 721 3194. This is open 24 hours a day. Summary  Taking steps to learn and use tension reduction techniques can help calm you and help prevent triggering an anxiety reaction.  When used together, medicines, psychotherapy, and tension reduction techniques may be the most effective treatment.  Family, friends, and partners can play a big part in helping you recover from an anxiety disorder. This information is not intended to replace advice given to you by your health care provider. Make sure you discuss any questions you have with your health care provider. Document Revised: 09/07/2018 Document Reviewed: 09/07/2018 Elsevier Patient Education  [place].

## 2020-01-18 ENCOUNTER — Other Ambulatory Visit: Payer: Self-pay | Admitting: Obstetrics and Gynecology

## 2020-01-18 DIAGNOSIS — R232 Flushing: Secondary | ICD-10-CM

## 2020-01-18 LAB — FOLLICLE STIMULATING HORMONE: FSH: 46.4 m[IU]/mL

## 2020-01-18 LAB — ESTRADIOL: Estradiol: 25.3 pg/mL

## 2020-01-18 MED ORDER — ESTRADIOL 0.05 MG/24HR TD PTWK: 0.0500 mg | MEDICATED_PATCH | TRANSDERMAL | 1 refills | Status: DC

## 2020-01-19 ENCOUNTER — Ambulatory Visit: Payer: PRIVATE HEALTH INSURANCE | Admitting: Pharmacist

## 2020-01-19 VITALS — Wt 196.0 lb

## 2020-01-19 DIAGNOSIS — R635 Abnormal weight gain: Secondary | ICD-10-CM

## 2020-01-19 DIAGNOSIS — K219 Gastro-esophageal reflux disease without esophagitis: Secondary | ICD-10-CM

## 2020-01-19 DIAGNOSIS — Z6832 Body mass index (BMI) 32.0-32.9, adult: Secondary | ICD-10-CM

## 2020-01-19 NOTE — Chronic Care Management (AMB) (Signed)
Chronic Care Management   Follow Up Note   01/19/2020 Name: Brianna [name] MRN: [id number] DOB: 03-27-1971  Referred by: Einar Pheasant, MD Reason for referral : Chronic Care Management (Medication Management)   Brianna [name] is a 49 y.o. year old female who is a primary care patient of Einar Pheasant, MD. The CCM team was consulted for assistance with chronic disease management and care coordination needs.    Contacted patient for medication management review.  Review of patient status, including review of consultants reports, relevant laboratory and other test results, and collaboration with appropriate care team members and the patient's provider was performed as part of comprehensive patient evaluation and provision of chronic care management services.    SDOH (Social Determinants of Health) assessments performed: Yes See Care Plan activities for detailed interventions related to SDOH)  SDOH Interventions     Most Recent Value  SDOH Interventions  Financial Strain Interventions Intervention Not Indicated       Outpatient Encounter Medications as of 01/19/2020  Medication Sig Note  . APPLE CIDER VINEGAR PO Take 1 tablet by mouth daily.   . clobetasol ointment (TEMOVATE) 0.05 % Apply to affected area twice a day for 4 weeks, then every night for 4 weeks and then twice a week for 4 weeks or until resolution.   Marland Kitchen estradiol (CLIMARA - DOSED IN MG/24 HR) 0.05 mg/24hr patch Place 1 patch (0.05 mg total) onto the skin once a week.   . Insulin Pen Needle (PEN NEEDLES) 32G X 4 MM MISC Use to inject Saxenda daily   . Liraglutide -Weight Management (SAXENDA) 18 MG/3ML SOPN Inject 0.6 mg daily for 1 week, then increase to 1.2 mg daily. Increase at weekly intervals as tolerated until maximum dose of 3 mg daily.   Marland Kitchen omeprazole (PRILOSEC) 20 MG capsule Take 1 capsule (20 mg total) by mouth 2 (two) times daily before a meal. Take 1 capsule (20 mg total) two times a day for 30 days and then take 1  capsule a day for two months. 11/29/2019: BID  . PARoxetine (PAXIL) 10 MG tablet Take 1 tablet (10 mg total) by mouth daily.    No facility-administered encounter medications on file as of 01/19/2020.     Objective:   Goals Addressed              This Visit's Progress     Patient Stated   .  PharmD "I need help losing weight" (pt-stated)        CARE PLAN ENTRY (see longitudinal plan of care for additional care plan information)  Current Barriers:  . Social, financial, community barriers:  o Notes that she feels better, more stable w/ menopause diagnosis. . Obesity; complicated by chronic medical conditions including GERD, family hx ASCVD, baseline BMI 34.25 (weight 205.8 lbs), previous BMI 33.28 (weight 200 lbs) three months ago o Most recent home weight: 196 lbs (9 lbs weight loss thus far) . Current exercise: no formal exercise, she works an active job on her feet all day . Current weight management pharmacotherapy: Saxenda 2.4 mg daily (increased from 1.8 mg yesterday)  o Notes nausea yesterday with dose increase, but none today o No constipation, takes probiotic daily and she feels this helps mitigate any constipation o Notes reduction in appetite most apparently with this most recent dose increase  . Menopausal symptoms: sees Dr. Dolores Lory. Hormone levels low, indicating menopause. Climara patch started yesterday. Paroxetine 10 mg daily also started for mood changes and hot flashes.  Pharmacist Clinical Goal(s):  Marland Kitchen Over the next 90 days, patient will work with PharmD and primary care provider to work towards 5-10% body weight loss  Interventions: . Comprehensive medication review performed, medication list updated in electronic medical record . Inter-disciplinary care team collaboration (see longitudinal plan of care) . Patient having success with weight loss on Saxenda therapy. Continue current plan of Saxenda 2.4 mg daily. Patient can increase to 3 mg daily after 1  week on 2.4 mg daily as tolerated. Patient verbalizes understanding and appreciation.  . Moving forward, will engage in shared decision making w/ Dr. Gilman Schmidt and Dr. [name] regarding paroxetine therapy. While this medication does have benefits in hot flashes, it also has a higher incidence of causing weight gain. May consider taper if patient's symptoms become more manageable with hormone replacement therapy.   Patient Self Care Activities:  . Patient will adhere to dietary modifications . Patient will target at least 150 minutes of moderate intensity exercise weekly . Patient will report any questions or concerns to provider   Please see past updates related to this goal by clicking on the "Past Updates" button in the selected goal          Plan:  - PCP f/u in ~ 4 weeks. Scheduled f/u phone call ~4-6 weeks after that  Catie Darnelle Maffucci, PharmD, [place], [place] Pharmacist [place] [phone]

## 2020-01-19 NOTE — Patient Instructions (Signed)
Visit Information  Goals Addressed              This Visit's Progress     Patient Stated   .  PharmD "I need help losing weight" (pt-stated)        CARE PLAN ENTRY (see longitudinal plan of care for additional care plan information)  Current Barriers:  . Social, financial, community barriers:  o Notes that she feels better, more stable w/ menopause diagnosis. . Obesity; complicated by chronic medical conditions including GERD, family hx ASCVD, baseline BMI 34.25 (weight 205.8 lbs), previous BMI 33.28 (weight 200 lbs) three months ago o Most recent home weight: 196 lbs (9 lbs weight loss thus far) . Current exercise: no formal exercise, she works an active job on her feet all day . Current weight management pharmacotherapy: Saxenda 2.4 mg daily (increased from 1.8 mg yesterday)  o Notes nausea yesterday with dose increase, but none today o No constipation, takes probiotic daily and she feels this helps mitigate any constipation o Notes reduction in appetite most apparently with this most recent dose increase  . Menopausal symptoms: sees Dr. Dolores Lory. Hormone levels low, indicating menopause. Climara patch started yesterday. Paroxetine 10 mg daily also started for mood changes and hot flashes.  Pharmacist Clinical Goal(s):  Marland Kitchen Over the next 90 days, patient will work with PharmD and primary care provider to work towards 5-10% body weight loss  Interventions: . Comprehensive medication review performed, medication list updated in electronic medical record . Inter-disciplinary care team collaboration (see longitudinal plan of care) . Patient having success with weight loss on Saxenda therapy. Continue current plan of Saxenda 2.4 mg daily. Patient can increase to 3 mg daily after 1 week on 2.4 mg daily as tolerated. Patient verbalizes understanding and appreciation.  . Moving forward, will engage in shared decision making w/ Dr. Gilman Schmidt and Dr. [name] regarding paroxetine therapy.  While this medication does have benefits in hot flashes, it also has a higher incidence of causing weight gain. May consider taper if patient's symptoms become more manageable with hormone replacement therapy.   Patient Self Care Activities:  . Patient will adhere to dietary modifications . Patient will target at least 150 minutes of moderate intensity exercise weekly . Patient will report any questions or concerns to provider   Please see past updates related to this goal by clicking on the "Past Updates" button in the selected goal         The patient verbalized understanding of instructions provided today and declined a print copy of patient instruction materials.    Plan:  - PCP f/u in ~ 4 weeks. Scheduled f/u phone call ~4-6 weeks after that  Catie Darnelle Maffucci, PharmD, [place], [place] Pharmacist [place] [phone]

## 2020-01-20 ENCOUNTER — Other Ambulatory Visit: Payer: Self-pay | Admitting: Obstetrics and Gynecology

## 2020-01-20 DIAGNOSIS — B9689 Other specified bacterial agents as the cause of diseases classified elsewhere: Secondary | ICD-10-CM

## 2020-01-20 LAB — NUSWAB BV AND CANDIDA, NAA
Atopobium vaginae: HIGH Score — AB
BVAB 2: HIGH Score — AB
Candida albicans, NAA: NEGATIVE
Candida glabrata, NAA: NEGATIVE
Megasphaera 1: HIGH Score — AB

## 2020-01-20 MED ORDER — CLINDAMYCIN HCL 300 MG PO CAPS: 300.0000 mg | ORAL_CAPSULE | Freq: Two times a day (BID) | ORAL | 0 refills | Status: AC

## 2020-01-31 ENCOUNTER — Encounter: Payer: Self-pay | Admitting: Obstetrics and Gynecology

## 2020-01-31 ENCOUNTER — Other Ambulatory Visit: Payer: Self-pay

## 2020-01-31 ENCOUNTER — Ambulatory Visit (INDEPENDENT_AMBULATORY_CARE_PROVIDER_SITE_OTHER): Payer: PRIVATE HEALTH INSURANCE | Admitting: Obstetrics and Gynecology

## 2020-01-31 VITALS — Ht 64.0 in | Wt 198.0 lb

## 2020-01-31 DIAGNOSIS — R232 Flushing: Secondary | ICD-10-CM | POA: Diagnosis not present

## 2020-01-31 NOTE — Progress Notes (Signed)
TELEPHONE VISIT  Pt states that's her estrogen patch is working great.

## 2020-01-31 NOTE — Progress Notes (Signed)
Virtual Visit via Telephone Note  I connected with Brianna [name] on 01/31/20 at  3:10 PM EDT by telephone and verified that I am speaking with the correct person using two identifiers.   I discussed the limitations, risks, security and privacy concerns of performing an evaluation and management service by telephone and the availability of in person appointments. I also discussed with the patient that there may be a patient responsible charge related to this service. The patient expressed understanding and agreed to proceed.  The patient was at home I spoke with the patient from my  Office.  The names of people involved in this encounter were: Dr. Gilman Schmidt and Brianna [name].   History of Present Illness: She started HRT about 1 week ago. She is happy with improvement in hot flashes with estrogen patch. She has noticed an improvment. Reduction to 2 hot flashes a day.    Observations/Objective:  Physical Exam could not be performed. Because of the COVID-19 outbreak this visit was performed over the phone and not in person.   Assessment and Plan: 49 yo in menopause with vasomotor symptoms.  Improved with HRT.   She will send a mychart message in 1 month. If still having daily hot flashes we will increase dose of HRT.  IUD for endometrial maintenance.  She has follow up with psychiatry planned for tomorrow. She has been taking paxil. She has mammogram scheduled.   Follow Up Instructions: Mychart message follow up in 1 month.   I discussed the assessment and treatment plan with the patient. The patient was provided an opportunity to ask questions and all were answered. The patient agreed with the plan and demonstrated an understanding of the instructions.   The patient was advised to call back or seek an in-person evaluation if the symptoms worsen or if the condition fails to improve as anticipated.  I provided 6 minutes of non-face-to-face time during this encounter.  Adrian Prows  MD [facility], [facility] 01/31/2020 4:09 PM

## 2020-02-01 ENCOUNTER — Ambulatory Visit (INDEPENDENT_AMBULATORY_CARE_PROVIDER_SITE_OTHER): Payer: PRIVATE HEALTH INSURANCE | Admitting: Vascular Surgery

## 2020-02-01 ENCOUNTER — Encounter (INDEPENDENT_AMBULATORY_CARE_PROVIDER_SITE_OTHER): Payer: Self-pay | Admitting: Vascular Surgery

## 2020-02-01 VITALS — BP 148/96 | HR 86 | Ht 64.0 in | Wt 200.0 lb

## 2020-02-01 DIAGNOSIS — I83813 Varicose veins of bilateral lower extremities with pain: Secondary | ICD-10-CM | POA: Diagnosis not present

## 2020-02-01 NOTE — Progress Notes (Signed)
Varicose veins of bilateral  lower extremity with inflammation (454.1  I83.10) Current Plans   Indication: Patient presents with symptomatic varicose veins of the bilateral  lower extremity.   Procedure: Sclerotherapy using hypertonic saline mixed with 1% Lidocaine was performed on the bilateral lower extremity. Compression wraps were placed. The patient tolerated the procedure well. 

## 2020-02-03 ENCOUNTER — Other Ambulatory Visit: Payer: Self-pay | Admitting: Obstetrics

## 2020-02-03 DIAGNOSIS — B373 Candidiasis of vulva and vagina: Secondary | ICD-10-CM

## 2020-02-03 DIAGNOSIS — B3731 Acute candidiasis of vulva and vagina: Secondary | ICD-10-CM

## 2020-02-03 MED ORDER — TERCONAZOLE 0.4 % VA CREA: 1.0000 | TOPICAL_CREAM | Freq: Every day | VAGINAL | 1 refills | Status: DC

## 2020-02-03 NOTE — Telephone Encounter (Signed)
Pt aware.

## 2020-02-14 ENCOUNTER — Other Ambulatory Visit: Payer: Self-pay

## 2020-02-14 ENCOUNTER — Telehealth (INDEPENDENT_AMBULATORY_CARE_PROVIDER_SITE_OTHER): Payer: PRIVATE HEALTH INSURANCE | Admitting: Internal Medicine

## 2020-02-14 ENCOUNTER — Encounter: Payer: Self-pay | Admitting: Internal Medicine

## 2020-02-14 DIAGNOSIS — F439 Reaction to severe stress, unspecified: Secondary | ICD-10-CM

## 2020-02-14 DIAGNOSIS — N926 Irregular menstruation, unspecified: Secondary | ICD-10-CM

## 2020-02-14 DIAGNOSIS — R635 Abnormal weight gain: Secondary | ICD-10-CM | POA: Diagnosis not present

## 2020-02-14 NOTE — Progress Notes (Signed)
Patient ID: Brianna [name], female   DOB: 10/03/1970, 49 y.o.   MRN: [id number]   Virtual Visit via video Note  This visit type was conducted due to national recommendations for restrictions regarding the COVID-19 pandemic (e.g. social distancing).  This format is felt to be most appropriate for this patient at this time.  All issues noted in this document were discussed and addressed.  No physical exam was performed (except for noted visual exam findings with Video Visits).   I connected with Brianna [name] by a video enabled telemedicine application and verified that I am speaking with the correct person using two identifiers. Location patient: home Location provider: work Persons participating in the virtual visit: patient, provider  The limitations, risks, security and privacy concerns of performing an evaluation and management service by video and the availability of in person appointments have bene discussed.  It has also been discussed with the patient that there may be a patient responsible charge related to this service. The patient expressed understanding and agreed to proceed.   Reason for visit: follow up appt.   HPI: Recently started on saxenda for weight loss.  She is tolerating. Some nausea initially.  Doing well now.  Also doing intermittent fasting.  Has lost weight.  She also feels better.  Was started on estrogen patch by gyn.  Feels better since being on the patch.  Also on paxil 10mg  q day.  Feeling better.  Anxiety better.  She is going on a work trip soon.  Has to fly.  Increased stress related to this.  Discussed.  Overall feels better.    ROS: See pertinent positives and negatives per HPI.  Past Medical History:  Diagnosis Date   Anemia    Chicken pox    Depression    Iron deficiency anemia 06/17/2018   Jaundice due to hepatitis    Restless leg syndrome     Past Surgical History:  Procedure Laterality Date   CESAREAN SECTION  [phone]    CHOLECYSTECTOMY  2003   COLONOSCOPY WITH PROPOFOL N/A 08/22/2019   Procedure: COLONOSCOPY WITH PROPOFOL;  Surgeon: Virgel Manifold, MD;  Location: [facility] ENDOSCOPY;  Service: Endoscopy;  Laterality: N/A;   ESOPHAGOGASTRODUODENOSCOPY (EGD) WITH PROPOFOL N/A 08/22/2019   Procedure: ESOPHAGOGASTRODUODENOSCOPY (EGD) WITH PROPOFOL;  Surgeon: Virgel Manifold, MD;  Location: [facility] ENDOSCOPY;  Service: Endoscopy;  Laterality: N/A;    Family History  Problem Relation Age of Onset   Early death Mother    Heart disease Mother    Hyperlipidemia Mother    Hypertension Mother    Alcohol abuse Father    Arthritis Father    COPD Father    Hyperlipidemia Father    Hypertension Father    Kidney disease Father    Arthritis Maternal Grandmother    Stroke Maternal Grandmother    Hyperlipidemia Maternal Grandfather    Breast cancer Paternal Aunt 69    SOCIAL HX: reviewed.    Current Outpatient Medications:    clobetasol ointment (TEMOVATE) 0.05 %, Apply to affected area twice a day for 4 weeks, then every night for 4 weeks and then twice a week for 4 weeks or until resolution., Disp: 30 g, Rfl: 5   estradiol (CLIMARA - DOSED IN MG/24 HR) 0.05 mg/24hr patch, Place 1 patch (0.05 mg total) onto the skin once a week., Disp: 4 patch, Rfl: 1   Insulin Pen Needle (PEN NEEDLES) 32G X 4 MM MISC, Use to inject Saxenda daily, Disp: 100 each, Rfl: 3  Liraglutide -Weight Management (SAXENDA) 18 MG/3ML SOPN, Inject 0.6 mg daily for 1 week, then increase to 1.2 mg daily. Increase at weekly intervals as tolerated until maximum dose of 3 mg daily. (Patient taking differently: 1.8 mg. Inject 0.6 mg daily for 1 week, then increase to 1.2 mg daily. Increase at weekly intervals as tolerated until maximum dose of 3 mg daily.), Disp: 15 mL, Rfl: 3   omeprazole (PRILOSEC) 20 MG capsule, Take 1 capsule (20 mg total) by mouth 2 (two) times daily before a meal. Take 1 capsule (20 mg total) two times a day  for 30 days and then take 1 capsule a day for two months., Disp: 60 capsule, Rfl: 2   PARoxetine (PAXIL) 10 MG tablet, Take 1 tablet (10 mg total) by mouth daily., Disp: 90 tablet, Rfl: 3   ALPRAZolam (XANAX) 0.25 MG tablet, Take 1 tablet (0.25 mg total) by mouth 2 (two) times daily as needed for anxiety., Disp: 15 tablet, Rfl: 0  EXAM:  GENERAL: alert, oriented, appears well and in no acute distress  HEENT: atraumatic, conjunttiva clear, no obvious abnormalities on inspection of external nose and ears  NECK: normal movements of the head and neck  LUNGS: on inspection no signs of respiratory distress, breathing rate appears normal, no obvious gross SOB, gasping or wheezing  CV: no obvious cyanosis  PSYCH/NEURO: pleasant and cooperative, no obvious depression or anxiety, speech and thought processing grossly intact  ASSESSMENT AND PLAN:  Discussed the following assessment and plan:  Problem List Items Addressed This Visit    Weight gain    On saxenda and tolerating.  Doing well.  Follow.        Stress    Increased stress.  Discussed.  Feels better. On paxil.  Feels better on estrogen patch.  Follow.  Some increased anxiety about upcoming trip.  Discussed.  Follow.       Menstrual changes    Seeing gyn.  On estrogen patch.  Feels better.  Follow.           I discussed the assessment and treatment plan with the patient. The patient was provided an opportunity to ask questions and all were answered. The patient agreed with the plan and demonstrated an understanding of the instructions.   The patient was advised to call back or seek an in-person evaluation if the symptoms worsen or if the condition fails to improve as anticipated.    Einar Pheasant, MD

## 2020-02-15 ENCOUNTER — Encounter: Payer: Self-pay | Admitting: Internal Medicine

## 2020-02-16 NOTE — Telephone Encounter (Signed)
Confirm with pt, no concern about being pregnant.  If no concern and since has taken previously and tolerated, I can send in rx for xanax .25mg  take 1 bid prn.  #15 with no refills.

## 2020-02-17 MED ORDER — ALPRAZOLAM 0.25 MG PO TABS: 0.2500 mg | ORAL_TABLET | Freq: Two times a day (BID) | ORAL | 0 refills | Status: DC | PRN

## 2020-02-17 NOTE — Telephone Encounter (Signed)
rx sent in for xanax #15 with no refills.

## 2020-02-19 ENCOUNTER — Encounter: Payer: Self-pay | Admitting: Internal Medicine

## 2020-02-19 DIAGNOSIS — F439 Reaction to severe stress, unspecified: Secondary | ICD-10-CM | POA: Insufficient documentation

## 2020-02-19 NOTE — Assessment & Plan Note (Signed)
Seeing gyn.  On estrogen patch.  Feels better.  Follow.

## 2020-02-19 NOTE — Assessment & Plan Note (Signed)
On saxenda and tolerating.  Doing well.  Follow.

## 2020-02-19 NOTE — Assessment & Plan Note (Signed)
Increased stress.  Discussed.  Feels better. On paxil.  Feels better on estrogen patch.  Follow.  Some increased anxiety about upcoming trip.  Discussed.  Follow.

## 2020-03-26 ENCOUNTER — Other Ambulatory Visit: Payer: Self-pay | Admitting: Obstetrics and Gynecology

## 2020-03-26 DIAGNOSIS — R232 Flushing: Secondary | ICD-10-CM

## 2020-03-28 ENCOUNTER — Other Ambulatory Visit: Payer: Self-pay | Admitting: Obstetrics and Gynecology

## 2020-03-28 DIAGNOSIS — R232 Flushing: Secondary | ICD-10-CM

## 2020-03-28 MED ORDER — ESTRADIOL 0.075 MG/24HR TD PTWK: 0.0750 mg | MEDICATED_PATCH | TRANSDERMAL | 11 refills | Status: DC

## 2020-03-29 ENCOUNTER — Ambulatory Visit: Payer: PRIVATE HEALTH INSURANCE | Admitting: Pharmacist

## 2020-03-29 VITALS — Wt 184.0 lb

## 2020-03-29 DIAGNOSIS — K219 Gastro-esophageal reflux disease without esophagitis: Secondary | ICD-10-CM

## 2020-03-29 DIAGNOSIS — Z6831 Body mass index (BMI) 31.0-31.9, adult: Secondary | ICD-10-CM

## 2020-03-29 NOTE — Chronic Care Management (AMB) (Signed)
Care Management   Pharmacy Note  03/29/2020 Name: Brianna [name] MRN: [id number] DOB: [birth year]   Subjective:  Brianna [name] is a 49 y.o. year old female who is a primary care patient of Einar Pheasant, MD. The Care Management/Care Coordination team team was consulted for assistance with care management and care coordination needs.    Engaged with patient by telephone for follow up visit in response to provider referral for pharmacy case management and/or care coordination services.   Consent to Services:  Ms. Valtierra was given information about care management/care coordination services, agreed to services, and gave verbal consent prior to initiation of services on 11/29/2018. Please see initial visit note for detailed documentation.   Review of patient status, including review of consultants reports, laboratory and other test data, was performed as part of comprehensive evaluation and provision of chronic care management services.   SDOH (Social Determinants of Health) assessments and interventions performed:  none  Objective:  Lab Results  Component Value Date   CREATININE 0.73 06/01/2019   CREATININE 0.47 05/25/2018        Component Value Date/Time   CHOL 226 (H) 06/01/2019 1045   TRIG 199.0 (H) 06/01/2019 1045   HDL 59.80 06/01/2019 1045   CHOLHDL 4 06/01/2019 1045   VLDL 39.8 06/01/2019 1045   LDLCALC 126 (H) 06/01/2019 1045     BP Readings from Last 3 Encounters:  02/01/20 (!) 148/96  11/28/19 (!) 152/101  11/13/19 124/76    Assessment:   Allergies  Allergen Reactions  . Vicodin [Hydrocodone-Acetaminophen] Hives    Medications Reviewed Today    Reviewed by De Hollingshead, RPH-CPP (Pharmacist) on 03/29/20 at 1312  Med List Status: <None>  Medication Order Taking? Sig Documenting Provider Last Dose Status Informant  ALPRAZolam (XANAX) 0.25 MG tablet [id number] No Take 1 tablet (0.25 mg total) by mouth 2 (two) times daily as needed for anxiety.  Patient not  taking: Reported on 03/29/2020   Einar Pheasant, MD Not Taking Active   clobetasol ointment (TEMOVATE) 0.05 % [id number] Yes Apply to affected area twice a day for 4 weeks, then every night for 4 weeks and then twice a week for 4 weeks or until resolution. Homero Fellers, MD Taking Active   estradiol (CLIMARA - DOSED IN MG/24 HR) 0.075 mg/24hr patch [id number] Yes Place 1 patch (0.075 mg total) onto the skin once a week. Homero Fellers, MD Taking Active   Insulin Pen Needle (PEN NEEDLES) 32G X 4 MM MISC [id number] Yes Use to inject Saxenda daily Einar Pheasant, MD Taking Active   Liraglutide -Weight Management (SAXENDA) 18 MG/3ML SOPN [id number] Yes Inject 0.6 mg daily for 1 week, then increase to 1.2 mg daily. Increase at weekly intervals as tolerated until maximum dose of 3 mg daily.  Patient taking differently: 1.8 mg. Inject 0.6 mg daily for 1 week, then increase to 1.2 mg daily. Increase at weekly intervals as tolerated until maximum dose of 3 mg daily.   Einar Pheasant, MD Taking Active   omeprazole (PRILOSEC) 20 MG capsule [id number] Yes Take 1 capsule (20 mg total) by mouth 2 (two) times daily before a meal. Take 1 capsule (20 mg total) two times a day for 30 days and then take 1 capsule a day for two months. Virgel Manifold, MD Taking Active            Med Note ([place] Nov 29, 2019  9:04 AM) BID  PARoxetine (PAXIL) 10 MG tablet  [id number] Yes Take 1 tablet (10 mg total) by mouth daily. Homero Fellers, MD Taking Active           Patient Active Problem List   Diagnosis Date Noted  . Stress 02/19/2020  . Dysphagia   . Barrett's esophagus without dysplasia   . Stomach irritation   . Weight gain 07/17/2019  . Varicose veins of leg with pain, bilateral 07/17/2019  . GERD (gastroesophageal reflux disease) 06/05/2019  . Menstrual changes 06/05/2019  . Encounter for screening colonoscopy 06/05/2019  . Sweating increase 06/05/2019  . Leiomyoma  06/29/2018  . Iron deficiency anemia 06/17/2018  . Restless leg 05/26/2018    Medication Assistance: None required. Patient affirms current coverage meets needs.   Patient Care Plan: Medication Management    Problem Identified: Weight Management, Anxiety, Menopause     Long-Range Goal: Disease Progression Prevention   This Visit's Progress: On track  Priority: High  Note:   Current Barriers:  . Unable to independently achieve control of weight   Pharmacist Clinical Goal(s):  Marland Kitchen Over the next 90 days, patient will maintain control of weight as evidenced through collaboration with PharmD and provider.   Interventions: . Inter-disciplinary care team collaboration (see longitudinal plan of care) . Comprehensive medication review performed; medication list updated in electronic medical record  Obesity, complicated by medical conditions including GERD, family hx ASCVD: . Improved; current treatment: Saxenda 0.6 mg + 3 clicks - had worked up to 2.4 mg, but went out of town on vacation and did not want to feel nauseated/full. Stopped medication. Restarted when she returned home. Plans to increase to 1.2 mg next wee  . Baseline weight: 205.8 lbs; most recent home weight: 184 lbs; 10.5% weight loss thus far . Probiotic generally helps mitigate constipation . Current meal patterns: breakfast: boiled egg or skips; lunch: leftovers from the night before; supper: generally take out, but she and partner try to order lower carbohydrate options; had fish the night before last, baked chicken last night . Exercise: no formal exercise, but gets 10,000 steps daily at work. Notes that they do have a gym at their apartment complex and should try to go occasionally . Recommended to continue current self titration schedule using clicks to titrate as tolerated.  . Encouraged to continue to focus on lower carbohydrate meal options. Encouraged to target 150 minutes of moderate intensity exercise weekly.    Anxiety: . Controlled; current treatment: paroxetine 10 mg daily; has not needed alprazolam besides her flight to [place] . Continue current regmen at this time Menopausal Symptoms  Patient Goals/Self-Care Activities . Over the next 90 days, patient will:  - take medications as prescribed target a minimum of 150 minutes of moderate intensity exercise weekly engage in dietary modifications by continuing to moderate portion sizes  Follow Up Plan: Telephone follow up appointment with care management team member scheduled for: ~ 8 weeks (PCP visit in ~ 4 weeks)       Plan: Telephone follow up appointment with care management team member scheduled for: ~ 8 weeks  Catie Darnelle Maffucci, PharmD, [place], [place] Pharmacist [place] [phone]

## 2020-03-29 NOTE — Patient Instructions (Signed)
Visit Information  Patient Care Plan: Medication Management    Problem Identified: Weight Management, Anxiety, Menopause     Long-Range Goal: Disease Progression Prevention   This Visit's Progress: On track  Priority: High  Note:   Current Barriers:  . Unable to independently achieve control of weight   Pharmacist Clinical Goal(s):  Marland Kitchen Over the next 90 days, patient will maintain control of weight as evidenced through collaboration with PharmD and provider.   Interventions: . Inter-disciplinary care team collaboration (see longitudinal plan of care) . Comprehensive medication review performed; medication list updated in electronic medical record  Obesity, complicated by medical conditions including GERD, family hx ASCVD: . Improved; current treatment: Saxenda 0.6 mg + 3 clicks - had worked up to 2.4 mg, but went out of town on vacation and did not want to feel nauseated/full. Stopped medication. Restarted when she returned home. Plans to increase to 1.2 mg next wee  . Baseline weight: 205.8 lbs; most recent home weight: 184 lbs; 10.5% weight loss thus far . Probiotic generally helps mitigate constipation . Current meal patterns: breakfast: boiled egg or skips; lunch: leftovers from the night before; supper: generally take out, but she and partner try to order lower carbohydrate options; had fish the night before last, baked chicken last night . Exercise: no formal exercise, but gets 10,000 steps daily at work. Notes that they do have a gym at their apartment complex and should try to go occasionally . Recommended to continue current self titration schedule using clicks to titrate as tolerated.  . Encouraged to continue to focus on lower carbohydrate meal options. Encouraged to target 150 minutes of moderate intensity exercise weekly.   Anxiety: . Controlled; current treatment: paroxetine 10 mg daily; has not needed alprazolam besides her flight to [place] . Continue current regmen at  this time Menopausal Symptoms  Patient Goals/Self-Care Activities . Over the next 90 days, patient will:  - take medications as prescribed target a minimum of 150 minutes of moderate intensity exercise weekly engage in dietary modifications by continuing to moderate portion sizes  Follow Up Plan: Telephone follow up appointment with care management team member scheduled for: ~ 8 weeks (PCP visit in ~ 4 weeks)      The patient verbalized understanding of instructions, educational materials, and care plan provided today and declined offer to receive copy of patient instructions, educational materials, and care plan.   Plan: Telephone follow up appointment with care management team member scheduled for: ~ 8 weeks  Catie Darnelle Maffucci, PharmD, [place], [place] Pharmacist [employer] [phone]

## 2020-05-04 ENCOUNTER — Encounter: Payer: Self-pay | Admitting: Internal Medicine

## 2020-05-07 ENCOUNTER — Ambulatory Visit: Payer: PRIVATE HEALTH INSURANCE | Admitting: Internal Medicine

## 2020-06-06 ENCOUNTER — Ambulatory Visit: Payer: Self-pay | Admitting: Pharmacist

## 2020-06-06 DIAGNOSIS — Z6831 Body mass index (BMI) 31.0-31.9, adult: Secondary | ICD-10-CM

## 2020-06-06 DIAGNOSIS — N926 Irregular menstruation, unspecified: Secondary | ICD-10-CM

## 2020-06-06 DIAGNOSIS — K2270 Barrett's esophagus without dysplasia: Secondary | ICD-10-CM

## 2020-06-06 NOTE — Patient Instructions (Signed)
Visit Information  Goals Addressed              This Visit's Progress     Patient Stated   .  Medication Management (pt-stated)        Patient Goals/Self-Care Activities . Over the next 90 days, patient will:  - take medications as prescribed target a minimum of 150 minutes of moderate intensity exercise weekly engage in dietary modifications by continuing to moderate portion sizes        Patient verbalizes understanding of instructions provided today and agrees to view in [place].    Plan: Telephone follow up appointment with care management team member scheduled for:  ~ 4 weeks  Catie Darnelle Maffucci, PharmD, [place], [place] Clinical Pharmacist [employer] at [employer] [phone]

## 2020-06-06 NOTE — Chronic Care Management (AMB) (Signed)
Care Management   Pharmacy Note  06/06/2020 Name: Brianna [name] MRN: [id number] DOB: 01/26/1971  Subjective: Brianna [name] is a 50 y.o. year old female who is a primary care patient of Einar Pheasant, MD. The Care Management team was consulted for assistance with care management and care coordination needs.    Engaged with patient by telephone for follow up visit in response to provider referral for pharmacy case management and/or care coordination services.   The patient was given information about Care Management services today including:  1. Care Management services includes personalized support from designated clinical staff supervised by the patient's primary care provider, including individualized plan of care and coordination with other care providers. 2. 24/7 contact phone numbers for assistance for urgent and routine care needs. 3. The patient may stop case management services at any time by phone call to the office staff.  Patient agreed to services and consent obtained.  Assessment:  Review of patient status, including review of consultants reports, laboratory and other test data, was performed as part of comprehensive evaluation and provision of chronic care management services.   SDOH (Social Determinants of Health) assessments and interventions performed:    Objective:  Lab Results  Component Value Date   CREATININE 0.73 06/01/2019   CREATININE 0.47 05/25/2018    No results found for: HGBA1C     Component Value Date/Time   CHOL 226 (H) 06/01/2019 1045   TRIG 199.0 (H) 06/01/2019 1045   HDL 59.80 06/01/2019 1045   CHOLHDL 4 06/01/2019 1045   VLDL 39.8 06/01/2019 1045   LDLCALC 126 (H) 06/01/2019 1045    Clinical ASCVD: No  The 10-year ASCVD risk score Mikey Bussing DC Jr., et al., 2013) is: 1.6%   Values used to calculate the score:     Age: 14 years     Sex: Female     Is Non-Hispanic African American: No     Diabetic: No     Tobacco smoker: No     Systolic Blood  Pressure: 148 mmHg     Is BP treated: No     HDL Cholesterol: 59.8 mg/dL     Total Cholesterol: 226 mg/dL     BP Readings from Last 3 Encounters:  02/01/20 (!) 148/96  11/28/19 (!) 152/101  11/13/19 124/76    Care Plan  Allergies  Allergen Reactions  . Vicodin [Hydrocodone-Acetaminophen] Hives    Medications Reviewed Today    Reviewed by De Hollingshead, RPH-CPP (Pharmacist) on 06/06/20 at 1315  Med List Status: <None>  Medication Order Taking? Sig Documenting Provider Last Dose Status Informant  ALPRAZolam (XANAX) 0.25 MG tablet [id number]  Take 1 tablet (0.25 mg total) by mouth 2 (two) times daily as needed for anxiety.  Patient not taking: Reported on 03/29/2020   Einar Pheasant, MD  Active   clobetasol ointment (TEMOVATE) 0.05 % [id number]  Apply to affected area twice a day for 4 weeks, then every night for 4 weeks and then twice a week for 4 weeks or until resolution. Homero Fellers, MD  Active   estradiol (CLIMARA - DOSED IN MG/24 HR) 0.075 mg/24hr patch [id number]  Place 1 patch (0.075 mg total) onto the skin once a week. Homero Fellers, MD  Active   Insulin Pen Needle (PEN NEEDLES) 32G X 4 MM MISC [id number]  Use to inject Saxenda daily Einar Pheasant, MD  Active   Liraglutide -Weight Management (SAXENDA) 18 MG/3ML SOPN [id number] Yes Inject 0.6 mg daily for 1 week, then increase  to 1.2 mg daily. Increase at weekly intervals as tolerated until maximum dose of 3 mg daily.  Patient taking differently: 1.8 mg. Inject 0.6 mg daily for 1 week, then increase to 1.2 mg daily. Increase at weekly intervals as tolerated until maximum dose of 3 mg daily.   Einar Pheasant, MD Taking Active            Med Note De Hollingshead   Wed Jun 06, 2020  1:15 PM) 0.6 mg daily    omeprazole (PRILOSEC) 20 MG capsule [id number]  Take 1 capsule (20 mg total) by mouth 2 (two) times daily before a meal. Take 1 capsule (20 mg total) two times a day for 30 days and then take 1 capsule  a day for two months. Virgel Manifold, MD  Active            Med Note De Hollingshead   Tue Nov 29, 2019  9:04 AM) BID  PARoxetine (PAXIL) 10 MG tablet [id number]  Take 1 tablet (10 mg total) by mouth daily. Homero Fellers, MD  Active           Patient Active Problem List   Diagnosis Date Noted  . Stress 02/19/2020  . Dysphagia   . Barrett's esophagus without dysplasia   . Stomach irritation   . Weight gain 07/17/2019  . Varicose veins of leg with pain, bilateral 07/17/2019  . GERD (gastroesophageal reflux disease) 06/05/2019  . Menstrual changes 06/05/2019  . Encounter for screening colonoscopy 06/05/2019  . Sweating increase 06/05/2019  . Leiomyoma 06/29/2018  . Iron deficiency anemia 06/17/2018  . Restless leg 05/26/2018    Conditions to be addressed/monitored: Obesity, Anxiety, Menopause  Care Plan : Medication Management  Updates made by De Hollingshead, RPH-CPP since 06/06/2020 12:00 AM    Problem: Weight Management, Anxiety, Menopause     Long-Range Goal: Disease Progression Prevention   This Visit's Progress: On track  Recent Progress: On track  Priority: High  Note:   Current Barriers:  . Unable to independently achieve control of weight   Pharmacist Clinical Goal(s):  Marland Kitchen Over the next 90 days, patient will maintain control of weight as evidenced through collaboration with PharmD and provider.   Interventions: . 1:1 collaboration with Einar Pheasant, MD regarding development and update of comprehensive plan of care as evidenced by provider attestation and co-signature . Inter-disciplinary care team collaboration (see longitudinal plan of care) . Comprehensive medication review performed; medication list updated in electronic medical record   Obesity, complicated by medical conditions including GERD, family hx ASCVD: . Improved; current treatment: Saxenda 0.6 mg - notes that she was out of town for work recently and did not take the  medication with her. Restarted 3 days ago back at 0.6 mg  . Baseline weight: 205.8 lbs; most recent home weight: 184 lbs; 10.5% weight loss thus far . Probiotic generally helps mitigate constipation w/ GLP1 . Current meal patterns: breakfast: boiled egg or skips; lunch: leftovers; supper: generally take out, chooisng lower carbohydrate options . Exercise: targets 10,000 steps daily at work . Reviewed to continue titrating up at weekly intervals as tolerated. Goal max does 3 mg daily or maximum tolerated dose.  . Continue to focus on physical activity. Goal 150 minutes of moderate intensity exercise weekly.  . Continue to focus on choosing lower carbohydrate options.   Anxiety: . Controlled; current treatment: paroxetine 10 mg daily; has not needed alprazolam besides her flight to [place] . Continue current regmen  at this time  Menopausal Symptoms . Improved per patient report; estradiol 0.075 mcg weekly patch . Encouraged continued adherence and follow up with gynecology.   Barrett's Esophagus, GERD . Controlled per patient report; current regimen: omeprazole 20 mg BID . Continue current regimen at this time  Patient Goals/Self-Care Activities . Over the next 90 days, patient will:  - take medications as prescribed target a minimum of 150 minutes of moderate intensity exercise weekly engage in dietary modifications by continuing to moderate portion sizes  Follow Up Plan: Telephone follow up appointment with care management team member scheduled for: ~ 4 weeks       Medication Assistance:  None required.  Patient affirms current coverage meets needs.  Follow Up:  Patient agrees to Care Plan and Follow-up.  Plan: Telephone follow up appointment with care management team member scheduled for:  ~ 4 weeks  Catie Darnelle Maffucci, PharmD, [place], [place] Clinical Pharmacist [employer] at [employer] [phone]

## 2020-06-20 DIAGNOSIS — K449 Diaphragmatic hernia without obstruction or gangrene: Secondary | ICD-10-CM | POA: Insufficient documentation

## 2020-07-04 ENCOUNTER — Telehealth: Payer: Self-pay

## 2020-07-06 ENCOUNTER — Telehealth: Payer: Self-pay

## 2020-07-06 ENCOUNTER — Other Ambulatory Visit: Payer: Self-pay | Admitting: Obstetrics and Gynecology

## 2020-07-06 DIAGNOSIS — F322 Major depressive disorder, single episode, severe without psychotic features: Secondary | ICD-10-CM

## 2020-07-06 DIAGNOSIS — R232 Flushing: Secondary | ICD-10-CM

## 2020-07-06 MED ORDER — ESTRADIOL 0.075 MG/24HR TD PTWK: 0.0750 mg | MEDICATED_PATCH | TRANSDERMAL | 11 refills | Status: DC

## 2020-07-06 MED ORDER — PAROXETINE HCL 10 MG PO TABS: 10.0000 mg | ORAL_TABLET | Freq: Every day | ORAL | 3 refills | Status: DC

## 2020-07-06 NOTE — Telephone Encounter (Signed)
Summer from [employer] to ck status of pt's estradiol and paroxetine; please eRx or call [phone]  Called pt; she verifies she wants these meds to come from [place] never has it and she goes 2wks without it.

## 2020-07-06 NOTE — Telephone Encounter (Signed)
I have sent the prescriptions as requested to [facility]

## 2020-07-11 NOTE — Telephone Encounter (Signed)
As we discussed

## 2020-07-24 ENCOUNTER — Telehealth: Payer: Self-pay

## 2020-08-10 NOTE — Telephone Encounter (Signed)
Pt calling; has not had a period in four years; last HS started bleeding heavily and clotting; has pain in back thru abd and back; is in [place], [place] for weekend for an event which she was unable to attend d/t pain and blood.  [phone]  Pt states she was saturating a pad q29min-1hr last night but not now; adv our protocol, if it should get that heavy again, is for her to go to ED.  Pt asked why this is happening; adv sometimes it just happens and sometimes it's cancer.  Pt tx'd to [facility] for scheduling.

## 2020-08-13 ENCOUNTER — Other Ambulatory Visit: Payer: Self-pay

## 2020-08-13 ENCOUNTER — Encounter: Payer: Self-pay | Admitting: Advanced Practice Midwife

## 2020-08-13 ENCOUNTER — Ambulatory Visit (INDEPENDENT_AMBULATORY_CARE_PROVIDER_SITE_OTHER): Payer: 59 | Admitting: Advanced Practice Midwife

## 2020-08-13 VITALS — BP 120/80 | Ht 64.0 in | Wt 184.0 lb

## 2020-08-13 DIAGNOSIS — N939 Abnormal uterine and vaginal bleeding, unspecified: Secondary | ICD-10-CM | POA: Diagnosis not present

## 2020-08-13 DIAGNOSIS — R110 Nausea: Secondary | ICD-10-CM | POA: Diagnosis not present

## 2020-08-13 MED ORDER — MEDROXYPROGESTERONE ACETATE 5 MG PO TABS: 10.0000 mg | ORAL_TABLET | Freq: Every day | ORAL | 0 refills | Status: DC

## 2020-08-13 MED ORDER — ONDANSETRON HCL 4 MG PO TABS: 4.0000 mg | ORAL_TABLET | Freq: Three times a day (TID) | ORAL | 0 refills | Status: DC | PRN

## 2020-08-15 ENCOUNTER — Telehealth: Payer: Self-pay

## 2020-08-15 NOTE — Progress Notes (Signed)
Patient ID: Brianna [name], female   DOB: 27-Nov-1970, 50 y.o.   MRN: [id number]  Reason for Consult: Vaginal Bleeding (IUD came out Sat)    Subjective:  Date of Service: 08/13/2020  HPI:  Brianna [name] is a 50 y.o. female being seen for recent onset heavy bleeding and expulsion of IUD. Past history includes 4 years ago becoming post-menopausal after a year without periods. Two years ago she had heavy bleeding. An ultrasound at that time revealed possible fibroid vs adenomyosis. She then had an endometrial biopsy with result of no malignancy, and had an IUD placed 09/16/2018 per Dr Gilman Schmidt. Then last weekend while in [place] she experienced strong cramping with heavy bleeding, pelvic and back pain, headache and nausea. Her IUD came out on Saturday. She was unable to participate in a long anticipated event and she went to a local hospital for evaluation. She brought her after visit summary which did not have notes/labs/u/s report.  She already has an appointment scheduled with Dr Gilman Schmidt for later this week and didn't think she could make it until then given the heavy bleeding and other discomforts. We discussed the need for an ultrasound and medications to treat nausea and to help decrease/stop the bleeding. She will also come back to see Dr Gilman Schmidt as scheduled.  Past Medical History:  Diagnosis Date  . Anemia   . Chicken pox   . Depression   . Iron deficiency anemia 06/17/2018  . Jaundice due to hepatitis   . Restless leg syndrome    Family History  Problem Relation Age of Onset  . Early death Mother   . Heart disease Mother   . Hyperlipidemia Mother   . Hypertension Mother   . Alcohol abuse Father   . Arthritis Father   . COPD Father   . Hyperlipidemia Father   . Hypertension Father   . Kidney disease Father   . Arthritis Maternal Grandmother   . Stroke Maternal Grandmother   . Hyperlipidemia Maternal Grandfather   . Breast cancer Paternal Aunt 19   Past Surgical History:   Procedure Laterality Date  . CESAREAN SECTION  [phone]  . CHOLECYSTECTOMY  2003  . COLONOSCOPY WITH PROPOFOL N/A 08/22/2019   Procedure: COLONOSCOPY WITH PROPOFOL;  Surgeon: Virgel Manifold, MD;  Location: [facility] ENDOSCOPY;  Service: Endoscopy;  Laterality: N/A;  . ESOPHAGOGASTRODUODENOSCOPY (EGD) WITH PROPOFOL N/A 08/22/2019   Procedure: ESOPHAGOGASTRODUODENOSCOPY (EGD) WITH PROPOFOL;  Surgeon: Virgel Manifold, MD;  Location: [facility] ENDOSCOPY;  Service: Endoscopy;  Laterality: N/A;    Short Social History:  Social History   Tobacco Use  . Smoking status: Former Research scientist (life sciences)  . Smokeless tobacco: Never Used  Substance Use Topics  . Alcohol use: Yes    Comment: occasional     Allergies  Allergen Reactions  . Vicodin [Hydrocodone-Acetaminophen] Hives    Current Outpatient Medications  Medication Sig Dispense Refill  . ALPRAZolam (XANAX) 0.25 MG tablet Take 1 tablet (0.25 mg total) by mouth 2 (two) times daily as needed for anxiety. 15 tablet 0  . clobetasol ointment (TEMOVATE) 0.05 % Apply to affected area twice a day for 4 weeks, then every night for 4 weeks and then twice a week for 4 weeks or until resolution. 30 g 5  . estradiol (CLIMARA - DOSED IN MG/24 HR) 0.075 mg/24hr patch Place 1 patch (0.075 mg total) onto the skin once a week. 4 patch 11  . Insulin Pen Needle (PEN NEEDLES) 32G X 4 MM MISC Use to inject [place]  daily 100 each 3  . Liraglutide -Weight Management (SAXENDA) 18 MG/3ML SOPN Inject 0.6 mg daily for 1 week, then increase to 1.2 mg daily. Increase at weekly intervals as tolerated until maximum dose of 3 mg daily. (Patient taking differently: 1.8 mg. Inject 0.6 mg daily for 1 week, then increase to 1.2 mg daily. Increase at weekly intervals as tolerated until maximum dose of 3 mg daily.) 15 mL 3  . medroxyPROGESTERone (PROVERA) 5 MG tablet Take 2 tablets (10 mg total) by mouth daily for 14 days. 28 tablet 0  . omeprazole (PRILOSEC) 20 MG capsule Take 1 capsule  (20 mg total) by mouth 2 (two) times daily before a meal. Take 1 capsule (20 mg total) two times a day for 30 days and then take 1 capsule a day for two months. 60 capsule 2  . ondansetron (ZOFRAN) 4 MG tablet Take 1 tablet (4 mg total) by mouth every 8 (eight) hours as needed for nausea or vomiting. 20 tablet 0  . PARoxetine (PAXIL) 10 MG tablet Take 1 tablet (10 mg total) by mouth daily. 90 tablet 3  . ibuprofen (ADVIL) 800 MG tablet Take 800 mg by mouth 3 (three) times daily.     No current facility-administered medications for this visit.   Review of Systems: negative except as noted in HPI     Objective:  Objective   Vitals:   08/13/20 1448  BP: 120/80  Weight: 184 lb (83.5 kg)  Height: 5\' 4"  (1.626 m)   Body mass index is 31.58 kg/m. Constitutional: Well nourished, well developed female in no acute distress.  HEENT: normal Skin: Warm and dry.   Extremity: no edema Respiratory:  Normal respiratory effort Back: no CVAT Neuro: DTRs 2+, Cranial nerves grossly intact Psych: Alert and Oriented x3. No memory deficits. Normal mood and affect.  MS: normal gait, normal bilateral lower extremity ROM/strength/stability.  More than 20 minutes were spent face to face with the patient with more than 50% of the time providing counseling and discussing the plan of management. Assessment/Plan:     50 y.o. G54 P3013 female with post menopausal menorrhagia  Rx Provera 10 mg daily for 14 days to slow/stop bleeding Rx Zofran for nausea Gyn ultrasound Follow up with Dr Gilman Schmidt as scheduled   [facility] 08/15/2020, 12:37 PM

## 2020-08-16 ENCOUNTER — Other Ambulatory Visit: Payer: Self-pay

## 2020-08-16 ENCOUNTER — Ambulatory Visit: Payer: PRIVATE HEALTH INSURANCE | Admitting: Obstetrics and Gynecology

## 2020-08-16 ENCOUNTER — Ambulatory Visit (INDEPENDENT_AMBULATORY_CARE_PROVIDER_SITE_OTHER): Payer: 59 | Admitting: Obstetrics and Gynecology

## 2020-08-16 ENCOUNTER — Telehealth: Payer: Self-pay

## 2020-08-16 ENCOUNTER — Encounter: Payer: Self-pay | Admitting: Obstetrics and Gynecology

## 2020-08-16 ENCOUNTER — Telehealth: Payer: Self-pay | Admitting: Pharmacist

## 2020-08-16 VITALS — BP 120/80 | Ht 64.0 in | Wt 213.0 lb

## 2020-08-16 DIAGNOSIS — N939 Abnormal uterine and vaginal bleeding, unspecified: Secondary | ICD-10-CM | POA: Diagnosis not present

## 2020-08-16 MED ORDER — MEDROXYPROGESTERONE ACETATE 10 MG PO TABS: 10.0000 mg | ORAL_TABLET | Freq: Every day | ORAL | 1 refills | Status: DC

## 2020-08-16 NOTE — H&P (View-Only) (Signed)
Patient ID: Brianna [name], female   DOB: 13-Mar-1971, 50 y.o.   MRN: [id number]  Reason for Consult: Follow-up   Referred by Einar Pheasant, MD  Subjective:     HPI:  Brianna [name] is a 50 y.o. female she reports that she had a sudden onset of heavy bleeding.  The patient had a history of prior bleeding which was controlled with a Mirena IUD.  She reports that on April 21 she started having very heavy uterine bleeding.  She reports that the bleeding was excessively heavy.  She reports that she was passing very large clots.  She reports that she was filling a pad every 30 minutes.  She was having painful cramps which were bringing her to tears.  She reports that she was having both pain across her lower abdomen and across her back.  She is taking Tylenol for this pain.  She also noted a severe headache.  She was staying out of town in [place] and there was an ER across the street from where she was staying.  She was seen in the ER.  She was told that her hemoglobin was stable.  She also reported that she had a pelvic ultrasound which was performed.  She was told that her endometrial lining was thickened at 7 cm.  She reports that her IUD fell out because of the heavy and excessive bleeding.  She was seen earlier this week by Rod Can CNM and started on 10 mg of Provera once a day.  This has helped greatly reduce the heaviness of her bleeding.  Prior endometrial biopsy in 2020  had shown inactive endometrium with breakdown.   She also reports that she continue to have hot flashes and wonders if her HRT dose needs to be increased.   Gynecological History  No LMP recorded. Patient is perimenopausal. Menarche: 13 Menopause: 54  History of fibroids, polyps, or ovarian cysts? : yes  History of PCOS? no Hstory of Endometriosis? no History of abnormal pap smears? no Have you had any sexually transmitted infections in the past? no  Last Pap: Results were: 2020 NIL HPV negative  She  identifies as a female. She is sexually active with men.   She denies dyspareunia. She denies postcoital bleeding.   Past Medical History:  Diagnosis Date  . Anemia   . Chicken pox   . Depression   . Iron deficiency anemia 06/17/2018  . Jaundice due to hepatitis   . Restless leg syndrome    Family History  Problem Relation Age of Onset  . Early death Mother   . Heart disease Mother   . Hyperlipidemia Mother   . Hypertension Mother   . Alcohol abuse Father   . Arthritis Father   . COPD Father   . Hyperlipidemia Father   . Hypertension Father   . Kidney disease Father   . Arthritis Maternal Grandmother   . Stroke Maternal Grandmother   . Hyperlipidemia Maternal Grandfather   . Breast cancer Paternal Aunt 17   Past Surgical History:  Procedure Laterality Date  . CESAREAN SECTION  [phone]  . CHOLECYSTECTOMY  2003  . COLONOSCOPY WITH PROPOFOL N/A 08/22/2019   Procedure: COLONOSCOPY WITH PROPOFOL;  Surgeon: Virgel Manifold, MD;  Location: [facility];  Service: Endoscopy;  Laterality: N/A;  . ESOPHAGOGASTRODUODENOSCOPY (EGD) WITH PROPOFOL N/A 08/22/2019   Procedure: ESOPHAGOGASTRODUODENOSCOPY (EGD) WITH PROPOFOL;  Surgeon: Virgel Manifold, MD;  Location: [facility];  Service: Endoscopy;  Laterality: N/A;    Short  Social History:  Social History   Tobacco Use  . Smoking status: Former Research scientist (life sciences)  . Smokeless tobacco: Never Used  Substance Use Topics  . Alcohol use: Yes    Comment: occasional     Allergies  Allergen Reactions  . Vicodin [Hydrocodone-Acetaminophen] Hives    Current Outpatient Medications  Medication Sig Dispense Refill  . ALPRAZolam (XANAX) 0.25 MG tablet Take 1 tablet (0.25 mg total) by mouth 2 (two) times daily as needed for anxiety. 15 tablet 0  . clobetasol ointment (TEMOVATE) 0.05 % Apply to affected area twice a day for 4 weeks, then every night for 4 weeks and then twice a week for 4 weeks or until resolution. 30 g 5  .  estradiol (CLIMARA - DOSED IN MG/24 HR) 0.075 mg/24hr patch Place 1 patch (0.075 mg total) onto the skin once a week. 4 patch 11  . ibuprofen (ADVIL) 800 MG tablet Take 800 mg by mouth 3 (three) times daily.    . Insulin Pen Needle (PEN NEEDLES) 32G X 4 MM MISC Use to inject Saxenda daily 100 each 3  . Liraglutide -Weight Management (SAXENDA) 18 MG/3ML SOPN Inject 0.6 mg daily for 1 week, then increase to 1.2 mg daily. Increase at weekly intervals as tolerated until maximum dose of 3 mg daily. (Patient taking differently: 1.8 mg. Inject 0.6 mg daily for 1 week, then increase to 1.2 mg daily. Increase at weekly intervals as tolerated until maximum dose of 3 mg daily.) 15 mL 3  . medroxyPROGESTERone (PROVERA) 5 MG tablet Take 2 tablets (10 mg total) by mouth daily for 14 days. 28 tablet 0  . omeprazole (PRILOSEC) 20 MG capsule Take 1 capsule (20 mg total) by mouth 2 (two) times daily before a meal. Take 1 capsule (20 mg total) two times a day for 30 days and then take 1 capsule a day for two months. 60 capsule 2  . ondansetron (ZOFRAN) 4 MG tablet Take 1 tablet (4 mg total) by mouth every 8 (eight) hours as needed for nausea or vomiting. 20 tablet 0  . PARoxetine (PAXIL) 10 MG tablet Take 1 tablet (10 mg total) by mouth daily. 90 tablet 3   No current facility-administered medications for this visit.    Review of Systems  Constitutional: Negative for chills, fatigue, fever and unexpected weight change.  HENT: Negative for trouble swallowing.  Eyes: Negative for loss of vision.  Respiratory: Negative for cough, shortness of breath and wheezing.  Cardiovascular: Negative for chest pain, leg swelling, palpitations and syncope.  GI: Negative for abdominal pain, blood in stool, diarrhea, nausea and vomiting.  GU: Negative for difficulty urinating, dysuria, frequency and hematuria.  Musculoskeletal: Negative for back pain, leg pain and joint pain.  Skin: Negative for rash.  Neurological: Negative for  dizziness, headaches, light-headedness, numbness and seizures.  Psychiatric: Negative for behavioral problem, confusion, depressed mood and sleep disturbance.        Objective:  Objective   Vitals:   08/16/20 1637  BP: 120/80  Weight: 213 lb (96.6 kg)  Height: 5\' 4"  (1.626 m)   Body mass index is 36.56 kg/m.  Physical Exam Vitals and nursing note reviewed. Exam conducted with a chaperone present.  Constitutional:      Appearance: Normal appearance. She is well-developed.  HENT:     Head: Normocephalic and atraumatic.  Eyes:     Extraocular Movements: Extraocular movements intact.     Pupils: Pupils are equal, round, and reactive to light.  Cardiovascular:  Rate and Rhythm: Normal rate and regular rhythm.  Pulmonary:     Effort: Pulmonary effort is normal. No respiratory distress.     Breath sounds: Normal breath sounds.  Abdominal:     General: Abdomen is flat.     Palpations: Abdomen is soft.  Genitourinary:    Comments: External: Normal appearing vulva. No lesions noted.  Speculum examination: Normal appearing cervix. Small blood in the vaginal vault. No discharge.   Bimanual examination: Uterus midline, non-tender, normal in size, shape and contour.  No CMT. No adnexal masses. No adnexal tenderness. Pelvis not fixed.  Musculoskeletal:        General: No signs of injury.  Skin:    General: Skin is warm and dry.  Neurological:     Mental Status: She is alert and oriented to person, place, and time.  Psychiatric:        Behavior: Behavior normal.        Thought Content: Thought content normal.        Judgment: Judgment normal.     Assessment/Plan:     50 yo with sudden onset of acute abnormal uterine bleeding.  Endometrium is thickened and irregular on bedside transvaginal US to 8 mm.  Will plan hysteroscopy D&C. I recommeded that she continue 10 mg of Provera until D&C could be scheduled.  Will consider changes to HRT after bleeding is evaluated.   More  than 20 minutes were spent face to face with the patient in the room, reviewing the medical record, labs and images, and coordinating care for the patient. The plan of management was discussed in detail and counseling was provided.     Adrian Prows MD [facility], [facility] 08/16/2020 4:46 PM

## 2020-08-16 NOTE — Telephone Encounter (Signed)
  Chronic Care Management   Note  08/16/2020 Name: Brianna [name] MRN: [id number] DOB: Jun 07, 1970   Attempted to contact patient for scheduled appointment for medication management support. Left HIPAA compliant message for patient to return my call at their convenience.    Plan: - If I do not hear back from the patient by end of business today, will collaborate with Care Guide to outreach to schedule follow up with me  Catie Darnelle Maffucci, PharmD, [place], [place] Pharmacist [employer] [facility] [employer] [phone]

## 2020-08-16 NOTE — Progress Notes (Signed)
 Patient ID: Brianna [name], female   DOB: 07/12/1970, 50 y.o.   MRN: [id number]  Reason for Consult: Follow-up   Referred by Scott, Charlene, MD  Subjective:     HPI:  Brianna [name] is a 50 y.o. female she reports that she had a sudden onset of heavy bleeding.  The patient had a history of prior bleeding which was controlled with a Mirena IUD.  She reports that on April 21 she started having very heavy uterine bleeding.  She reports that the bleeding was excessively heavy.  She reports that she was passing very large clots.  She reports that she was filling a pad every 30 minutes.  She was having painful cramps which were bringing her to tears.  She reports that she was having both pain across her lower abdomen and across her back.  She is taking Tylenol for this pain.  She also noted a severe headache.  She was staying out of town in [place] and there was an ER across the street from where she was staying.  She was seen in the ER.  She was told that her hemoglobin was stable.  She also reported that she had a pelvic ultrasound which was performed.  She was told that her endometrial lining was thickened at 7 cm.  She reports that her IUD fell out because of the heavy and excessive bleeding.  She was seen earlier this week by Jane Gledhill CNM and started on 10 mg of Provera once a day.  This has helped greatly reduce the heaviness of her bleeding.  Prior endometrial biopsy in 2020  had shown inactive endometrium with breakdown.   She also reports that she continue to have hot flashes and wonders if her HRT dose needs to be increased.   Gynecological History  No LMP recorded. Patient is perimenopausal. Menarche: 13 Menopause: 46  History of fibroids, polyps, or ovarian cysts? : yes  History of PCOS? no Hstory of Endometriosis? no History of abnormal pap smears? no Have you had any sexually transmitted infections in the past? no  Last Pap: Results were: 2020 NIL HPV negative  She  identifies as a female. She is sexually active with men.   She denies dyspareunia. She denies postcoital bleeding.   Past Medical History:  Diagnosis Date  . Anemia   . Chicken pox   . Depression   . Iron deficiency anemia 06/17/2018  . Jaundice due to hepatitis   . Restless leg syndrome    Family History  Problem Relation Age of Onset  . Early death Mother   . Heart disease Mother   . Hyperlipidemia Mother   . Hypertension Mother   . Alcohol abuse Father   . Arthritis Father   . COPD Father   . Hyperlipidemia Father   . Hypertension Father   . Kidney disease Father   . Arthritis Maternal Grandmother   . Stroke Maternal Grandmother   . Hyperlipidemia Maternal Grandfather   . Breast cancer Paternal Aunt 19   Past Surgical History:  Procedure Laterality Date  . CESAREAN SECTION  1992,1998,2003  . CHOLECYSTECTOMY  2003  . COLONOSCOPY WITH PROPOFOL N/A 08/22/2019   Procedure: COLONOSCOPY WITH PROPOFOL;  Surgeon: Tahiliani, Varnita B, MD;  Location: [facility];  Service: Endoscopy;  Laterality: N/A;  . ESOPHAGOGASTRODUODENOSCOPY (EGD) WITH PROPOFOL N/A 08/22/2019   Procedure: ESOPHAGOGASTRODUODENOSCOPY (EGD) WITH PROPOFOL;  Surgeon: Tahiliani, Varnita B, MD;  Location: [facility];  Service: Endoscopy;  Laterality: N/A;    Short   Social History:  Social History   Tobacco Use  . Smoking status: Former Research scientist (life sciences)  . Smokeless tobacco: Never Used  Substance Use Topics  . Alcohol use: Yes    Comment: occasional     Allergies  Allergen Reactions  . Vicodin [Hydrocodone-Acetaminophen] Hives    Current Outpatient Medications  Medication Sig Dispense Refill  . ALPRAZolam (XANAX) 0.25 MG tablet Take 1 tablet (0.25 mg total) by mouth 2 (two) times daily as needed for anxiety. 15 tablet 0  . clobetasol ointment (TEMOVATE) 0.05 % Apply to affected area twice a day for 4 weeks, then every night for 4 weeks and then twice a week for 4 weeks or until resolution. 30 g 5  .  estradiol (CLIMARA - DOSED IN MG/24 HR) 0.075 mg/24hr patch Place 1 patch (0.075 mg total) onto the skin once a week. 4 patch 11  . ibuprofen (ADVIL) 800 MG tablet Take 800 mg by mouth 3 (three) times daily.    . Insulin Pen Needle (PEN NEEDLES) 32G X 4 MM MISC Use to inject Saxenda daily 100 each 3  . Liraglutide -Weight Management (SAXENDA) 18 MG/3ML SOPN Inject 0.6 mg daily for 1 week, then increase to 1.2 mg daily. Increase at weekly intervals as tolerated until maximum dose of 3 mg daily. (Patient taking differently: 1.8 mg. Inject 0.6 mg daily for 1 week, then increase to 1.2 mg daily. Increase at weekly intervals as tolerated until maximum dose of 3 mg daily.) 15 mL 3  . medroxyPROGESTERone (PROVERA) 5 MG tablet Take 2 tablets (10 mg total) by mouth daily for 14 days. 28 tablet 0  . omeprazole (PRILOSEC) 20 MG capsule Take 1 capsule (20 mg total) by mouth 2 (two) times daily before a meal. Take 1 capsule (20 mg total) two times a day for 30 days and then take 1 capsule a day for two months. 60 capsule 2  . ondansetron (ZOFRAN) 4 MG tablet Take 1 tablet (4 mg total) by mouth every 8 (eight) hours as needed for nausea or vomiting. 20 tablet 0  . PARoxetine (PAXIL) 10 MG tablet Take 1 tablet (10 mg total) by mouth daily. 90 tablet 3   No current facility-administered medications for this visit.    Review of Systems  Constitutional: Negative for chills, fatigue, fever and unexpected weight change.  HENT: Negative for trouble swallowing.  Eyes: Negative for loss of vision.  Respiratory: Negative for cough, shortness of breath and wheezing.  Cardiovascular: Negative for chest pain, leg swelling, palpitations and syncope.  GI: Negative for abdominal pain, blood in stool, diarrhea, nausea and vomiting.  GU: Negative for difficulty urinating, dysuria, frequency and hematuria.  Musculoskeletal: Negative for back pain, leg pain and joint pain.  Skin: Negative for rash.  Neurological: Negative for  dizziness, headaches, light-headedness, numbness and seizures.  Psychiatric: Negative for behavioral problem, confusion, depressed mood and sleep disturbance.        Objective:  Objective   Vitals:   08/16/20 1637  BP: 120/80  Weight: 213 lb (96.6 kg)  Height: 5\' 4"  (1.626 m)   Body mass index is 36.56 kg/m.  Physical Exam Vitals and nursing note reviewed. Exam conducted with a chaperone present.  Constitutional:      Appearance: Normal appearance. She is well-developed.  HENT:     Head: Normocephalic and atraumatic.  Eyes:     Extraocular Movements: Extraocular movements intact.     Pupils: Pupils are equal, round, and reactive to light.  Cardiovascular:  Rate and Rhythm: Normal rate and regular rhythm.  Pulmonary:     Effort: Pulmonary effort is normal. No respiratory distress.     Breath sounds: Normal breath sounds.  Abdominal:     General: Abdomen is flat.     Palpations: Abdomen is soft.  Genitourinary:    Comments: External: Normal appearing vulva. No lesions noted.  Speculum examination: Normal appearing cervix. Small blood in the vaginal vault. No discharge.   Bimanual examination: Uterus midline, non-tender, normal in size, shape and contour.  No CMT. No adnexal masses. No adnexal tenderness. Pelvis not fixed.  Musculoskeletal:        General: No signs of injury.  Skin:    General: Skin is warm and dry.  Neurological:     Mental Status: She is alert and oriented to person, place, and time.  Psychiatric:        Behavior: Behavior normal.        Thought Content: Thought content normal.        Judgment: Judgment normal.     Assessment/Plan:     50 yo with sudden onset of acute abnormal uterine bleeding.  Endometrium is thickened and irregular on bedside transvaginal US to 8 mm.  Will plan hysteroscopy D&C. I recommeded that she continue 10 mg of Provera until D&C could be scheduled.  Will consider changes to HRT after bleeding is evaluated.   More  than 20 minutes were spent face to face with the patient in the room, reviewing the medical record, labs and images, and coordinating care for the patient. The plan of management was discussed in detail and counseling was provided.     Ivonne Freeburg MD [facility], [facility] 08/16/2020 4:46 PM    

## 2020-08-16 NOTE — Patient Instructions (Signed)
Hysteroscopy Hysteroscopy is a procedure used to look inside a woman's womb (uterus). This may be done for various reasons, including:  To look for tumors and other growths in the uterus.  To evaluate abnormal bleeding, fibroid tumors, polyps, scar tissue, or uterine cancer.  To determine why a woman is unable to get pregnant or has had repeated pregnancy losses.  To locate an IUD (intrauterine device).  To place a birth control device into the fallopian tubes. During this procedure, a thin, flexible tube with a small light and camera (hysteroscope) is used to examine the uterus. The camera sends images to a monitor in the room so that your health care provider can view the inside of your uterus. A hysteroscopy should be done right after a menstrual period. Tell a health care provider about:  Any allergies you have.  All medicines you are taking, including vitamins, herbs, eye drops, creams, and over-the-counter medicines.  Any problems you or family members have had with anesthetic medicines.  Any blood disorders you have.  Any surgeries you have had.  Any medical conditions you have.  Whether you are pregnant or may be pregnant.  Whether you have been diagnosed with an STI (sexually transmitted infection) or you think you have an STI. What are the risks? Generally, this is a safe procedure. However, problems may occur, including:  Excessive bleeding.  Infection.  Damage to the uterus or other structures or organs.  Allergic reaction to medicines or fluids that are used in the procedure. What happens before the procedure? Staying hydrated Follow instructions from your health care provider about hydration, which may include:  Up to 2 hours before the procedure - you may continue to drink clear liquids, such as water, clear fruit juice, black coffee, and plain tea. Eating and drinking restrictions Follow instructions from your health care provider about eating and  drinking, which may include:  8 hours before the procedure - stop eating solid foods and drink clear liquids only.  2 hours before the procedure - stop drinking clear liquids. Medicines  Ask your health care provider about: ? Changing or stopping your regular medicines. This is especially important if you are taking diabetes medicines or blood thinners. ? Taking medicines such as aspirin and ibuprofen. These medicines can thin your blood. Do not take these medicines unless your health care provider tells you to take them. ? Taking over-the-counter medicines, vitamins, herbs, and supplements.  Medicine may be placed in your cervix the day before the procedure. This medicine causes the cervix to open (dilate). The larger opening makes it easier for the hysteroscope to be inserted into the uterus during the procedure. General instructions  Ask your health care provider: ? What steps will be taken to help prevent infection. These steps may include:  Washing skin with a germ-killing soap.  Taking antibiotic medicine.  Do not use any products that contain nicotine or tobacco for at least 4 weeks before the procedure. These products include cigarettes, chewing tobacco, and vaping devices, such as e-cigarettes. If you need help quitting, ask your health care provider.  Plan to have a responsible adult take you home from the hospital or clinic.  Plan to have a responsible adult care for you for the time you are told after you leave the hospital or clinic. This is important.  Empty your bladder before the procedure begins. What happens during the procedure?  An IV will be inserted into one of your veins.  You may be given: ?  A medicine to help you relax (sedative). ? A medicine that numbs the area around the cervix (local anesthetic). ? A medicine to make you fall asleep (general anesthetic).  A hysteroscope will be inserted through your vagina and into your uterus.  Air or fluid will  be used to enlarge your uterus to allow your health care provider to see it better. The amount of fluid used will be carefully checked throughout the procedure.  In some cases, tissue may be gently scraped from inside the uterus and sent to a lab for testing (biopsy). The procedure may vary among health care providers and hospitals. What can I expect after the procedure?  Your blood pressure, heart rate, breathing rate, and blood oxygen level will be monitored until you leave the hospital or clinic.  You may have cramps. You may be given medicines for this.  You may have bleeding, which may vary from light spotting to menstrual-like bleeding. This is normal.  If you had a biopsy, it is up to you to get the results. Ask your health care provider, or the department that is doing the procedure, when your results will be ready. Follow these instructions at home: Activity  Rest as told by your health care provider.  Return to your normal activities as told by your health care provider. Ask your health care provider what activities are safe for you.  If you were given a sedative during the procedure, it can affect you for several hours. Do not drive or operate machinery until your health care provider says that it is safe. Medicines  Do not take aspirin or other NSAIDs during recovery, as told by your healthcare provider. It can increase the risk of bleeding.  Ask your health care provider if the medicine prescribed to you: ? Requires you to avoid driving or using machinery. ? Can cause constipation. You may need to take these actions to prevent or treat constipation:  Drink enough fluid to keep your urine pale yellow.  Take over-the-counter or prescription medicines.  Eat foods that are high in fiber, such as beans, whole grains, and fresh fruits and vegetables.  Limit foods that are high in fat and processed sugars, such as fried or sweet foods. General instructions  Do not douche,  use tampons, or have sex for 2 weeks after the procedure, or until your health care provider approves.  Do not take baths, swim, or use a hot tub until your health care provider approves. Take showers instead of baths for 2 weeks, or for as long as told by your health care provider.  Keep all follow-up visits. This is important. Contact a health care provider if:  You feel dizzy or lightheaded.  You feel nauseous.  You have abnormal vaginal discharge.  You have a rash.  You have pain that does not get better with medicine.  You have chills. Get help right away if:  You have bleeding that is heavier than a normal menstrual period.  You have a fever.  You have pain or cramps that get worse.  You develop new abdominal pain.  You faint.  You have pain in your shoulder.  You are short of breath. Summary  Hysteroscopy is a procedure that is used to look inside a woman's womb (uterus).  After the procedure, you may have bleeding, which varies from light spotting to menstrual-like bleeding. This is normal. You may also have cramps.  Do not douche, use tampons, or have sex for 2 weeks after  the procedure, or until your health care provider approves.  Plan to have a responsible adult take you home from the hospital or clinic. This information is not intended to replace advice given to you by your health care provider. Make sure you discuss any questions you have with your health care provider. Document Revised: 11/23/2019 Document Reviewed: 11/23/2019 Elsevier Patient Education  2021 [employer].

## 2020-08-17 ENCOUNTER — Telehealth: Payer: Self-pay

## 2020-08-17 NOTE — Chronic Care Management (AMB) (Signed)
  Care Management   Note  08/17/2020 Name: Brianna [name] MRN: [id number] DOB: 05/18/1970  [name] is a 50 y.o. year old female who is a primary care patient of Einar Pheasant, MD and is actively engaged with the care management team. I reached out to [name] by phone today to assist with re-scheduling a follow up visit with the Pharmacist  Follow up plan: Unsuccessful telephone outreach attempt made. A HIPAA compliant phone message was left for the patient providing contact information and requesting a return call.  The care management team will reach out to the patient again over the next 7 days.  If patient returns call to provider office, please advise to call [facility]  at [place], [place], [place], [place] [address] Direct Dial: [phone] [email or web address]

## 2020-08-20 ENCOUNTER — Telehealth: Payer: Self-pay

## 2020-08-20 ENCOUNTER — Other Ambulatory Visit: Payer: Self-pay | Admitting: Obstetrics and Gynecology

## 2020-08-20 DIAGNOSIS — R232 Flushing: Secondary | ICD-10-CM

## 2020-08-20 NOTE — Telephone Encounter (Signed)
Called patient to schedule hysteroscopy d&c w [facility]  DOS 5/10  H&P N/A  Covid testing N/A  Pre-admit phone call appointment to be requested. All appointments will be updated on pt MyChart. Explained that this appointment has a call window. Based on the time scheduled will indicate if the call will be received within a 4 hour window before 1:00 or after.  Advised that pt may also receive calls from the hospital pharmacy and pre-service center.  Confirmed pt has [employer] as primary insurance. No secondary insurance.

## 2020-08-20 NOTE — Telephone Encounter (Signed)
-----   Message from Homero Fellers, MD sent at 08/16/2020  5:13 PM EDT ----- Surgery Booking Request Patient Full Name:  Brianna [name]  MRN: [id number]  DOB: 1970/11/30  Surgeon: Homero Fellers, MD  Requested Surgery Date and Time: next month ideally Primary Diagnosis AND Code: Abnormal uterine bleeding, postmenopausal bleeding Secondary Diagnosis and Code:  Surgical Procedure: Hysteroscopy D&C RNFA Requested?: No L&D Notification: No Admission Status: same day surgery Length of Surgery: 50 min Special Case Needs: No H&P: No Phone Interview???:  Yes Interpreter: No Medical Clearance:  Yes Special Scheduling Instructions: No Any known health/anesthesia issues, diabetes, sleep apnea, latex allergy, defibrillator/pacemaker?: No Acuity: P2   (P1 highest, P2 delay may cause harm, P3 low, elective gyn, P4 lowest)

## 2020-08-21 NOTE — Telephone Encounter (Signed)
Orders placed thank you.

## 2020-08-23 ENCOUNTER — Other Ambulatory Visit: Payer: Self-pay

## 2020-08-23 ENCOUNTER — Encounter
Admission: RE | Admit: 2020-08-23 | Discharge: 2020-08-23 | Disposition: A | Discharge status: Home / Self Care | Payer: 59 | Source: Ambulatory Visit | Attending: Obstetrics and Gynecology | Admitting: Obstetrics and Gynecology

## 2020-08-23 ENCOUNTER — Other Ambulatory Visit: Payer: Self-pay | Admitting: Advanced Practice Midwife

## 2020-08-23 DIAGNOSIS — N939 Abnormal uterine and vaginal bleeding, unspecified: Secondary | ICD-10-CM

## 2020-08-23 HISTORY — DX: Gastro-esophageal reflux disease without esophagitis: K21.9

## 2020-08-23 NOTE — Patient Instructions (Signed)
Your procedure is scheduled on: 08/28/20 Report to [facility]. To find out your arrival time please call [phone] between 1PM - 3PM on 08/27/20.  Remember: Instructions that are not followed completely may result in serious medical risk, up to and including death, or upon the discretion of your surgeon and anesthesiologist your surgery may need to be rescheduled.     _X__ 1. Do not eat food after midnight the night before your procedure.                 No gum chewing or hard candies. You may drink clear liquids up to 2 hours                 before you are scheduled to arrive for your surgery- DO not drink clear                 liquids within 2 hours of the start of your surgery.                 Clear Liquids include:  water, apple juice without pulp, clear carbohydrate                 drink such as Clearfast or Gatorade, Black Coffee or Tea (Do not add                 anything to coffee or tea). Diabetics water only  THE ENSURE PRE SURGERY DRINK SHOULD BE FINISHED NO LATER THAN 2 HOURS BEFORE YOUR SURGERY OR 4:30 AM IF 6:00 AM IS YOUR ARRIVAL TIME  __X__2.  On the morning of surgery brush your teeth with toothpaste and water, you                 may rinse your mouth with mouthwash if you wish.  Do not swallow any              toothpaste of mouthwash.     _X__ 3.  No Alcohol for 24 hours before or after surgery.   _X__ 4.  Do Not Smoke or use e-cigarettes For 24 Hours Prior to Your Surgery.                 Do not use any chewable tobacco products for at least 6 hours prior to                 surgery.  ____  5.  Bring all medications with you on the day of surgery if instructed.   __X__  6.  Notify your doctor if there is any change in your medical condition      (cold, fever, infections).     Do not wear jewelry, make-up, hairpins, clips or nail polish. Do not wear lotions, powders, or perfumes.  Do not shave 48 hours prior to  surgery. Men may shave face and neck. Do not bring valuables to the hospital.    [facility] is not responsible for any belongings or valuables.  Contacts, dentures/partials or body piercings may not be worn into surgery. Bring a case for your contacts, glasses or hearing aids, a denture cup will be supplied. Leave your suitcase in the car. After surgery it may be brought to your room. For patients admitted to the hospital, discharge time is determined by your treatment team.   Patients discharged the day of surgery will not be allowed to drive home.   Please read over the  following fact sheets that you were given:   [place]  __X__ Take these medicines the morning of surgery with A SIP OF WATER:    1. omeprazole (PRILOSEC) 20 MG capsule  2. PARoxetine (PAXIL) 10 MG tablet  3.   4.  5.  6.  ____ Fleet Enema (as directed)   ____ Use CHG Soap/SAGE wipes as directed  ____ Use inhalers on the day of surgery  ____ Stop metformin/Janumet/Farxiga 2 days prior to surgery    ____ Take 1/2 of usual insulin dose the night before surgery. No insulin the morning          of surgery.   ____ Stop Blood Thinners Coumadin/Plavix/Xarelto/Pleta/Pradaxa/Eliquis/Effient/Aspirin  on   Or contact your Surgeon, Cardiologist or Medical Doctor regarding  ability to stop your blood thinners  __X__ Stop Anti-inflammatories 7 days before surgery such as Advil, Ibuprofen, Motrin,  BC or Goodies Powder, Naprosyn, Naproxen, Aleve, Aspirin    __X__ Stop all herbal supplements, fish oil or vitamin E until after surgery.    ____ Bring C-Pap to the hospital.

## 2020-08-24 ENCOUNTER — Encounter
Admission: RE | Admit: 2020-08-24 | Discharge: 2020-08-24 | Disposition: A | Discharge status: Home / Self Care | Payer: 59 | Source: Ambulatory Visit | Attending: Obstetrics and Gynecology | Admitting: Obstetrics and Gynecology

## 2020-08-24 ENCOUNTER — Other Ambulatory Visit: Payer: 59

## 2020-08-24 ENCOUNTER — Other Ambulatory Visit: Payer: Self-pay | Admitting: Obstetrics and Gynecology

## 2020-08-24 DIAGNOSIS — Z01812 Encounter for preprocedural laboratory examination: Secondary | ICD-10-CM | POA: Diagnosis not present

## 2020-08-24 DIAGNOSIS — R232 Flushing: Secondary | ICD-10-CM

## 2020-08-24 LAB — CBC
HCT: 36.2 % (ref 36.0–46.0)
Hemoglobin: 12.5 g/dL (ref 12.0–15.0)
MCH: 32.6 pg (ref 26.0–34.0)
MCHC: 34.5 g/dL (ref 30.0–36.0)
MCV: 94.5 fL (ref 80.0–100.0)
Platelets: 271 10*3/uL (ref 150–400)
RBC: 3.83 MIL/uL — ABNORMAL LOW (ref 3.87–5.11)
RDW: 11.9 % (ref 11.5–15.5)
WBC: 6.6 10*3/uL (ref 4.0–10.5)
nRBC: 0 % (ref 0.0–0.2)

## 2020-08-24 LAB — TYPE AND SCREEN
ABO/RH(D): O NEG
Antibody Screen: NEGATIVE

## 2020-08-24 MED ORDER — ESTRADIOL 0.1 MG/24HR TD PTWK: 0.1000 mg | MEDICATED_PATCH | TRANSDERMAL | 12 refills | Status: DC

## 2020-08-27 MED ORDER — LACTATED RINGERS IV SOLN: INTRAVENOUS | Status: DC

## 2020-08-27 MED ORDER — ORAL CARE MOUTH RINSE: 15.0000 mL | Freq: Once | OROMUCOSAL | Status: AC

## 2020-08-27 MED ORDER — POVIDONE-IODINE 10 % EX SWAB: 2.0000 "application " | Freq: Once | CUTANEOUS | Status: DC

## 2020-08-27 MED ORDER — CHLORHEXIDINE GLUCONATE 0.12 % MT SOLN: 15.0000 mL | Freq: Once | OROMUCOSAL | Status: AC

## 2020-08-27 NOTE — Telephone Encounter (Signed)
Patient has been rescheduled.

## 2020-08-28 ENCOUNTER — Encounter
Admission: RE | Disposition: A | Discharge status: Home / Self Care | Payer: Self-pay | Source: Home / Self Care | Attending: Obstetrics and Gynecology

## 2020-08-28 ENCOUNTER — Encounter: Payer: Self-pay | Admitting: Obstetrics and Gynecology

## 2020-08-28 ENCOUNTER — Ambulatory Visit
Admission: RE | Admit: 2020-08-28 | Discharge: 2020-08-28 | Disposition: A | Discharge status: Home / Self Care | Payer: 59 | Attending: Obstetrics and Gynecology | Admitting: Obstetrics and Gynecology

## 2020-08-28 ENCOUNTER — Ambulatory Visit: Payer: 59 | Admitting: Anesthesiology

## 2020-08-28 DIAGNOSIS — Z79890 Hormone replacement therapy (postmenopausal): Secondary | ICD-10-CM | POA: Diagnosis not present

## 2020-08-28 DIAGNOSIS — N939 Abnormal uterine and vaginal bleeding, unspecified: Secondary | ICD-10-CM | POA: Diagnosis not present

## 2020-08-28 DIAGNOSIS — Z79899 Other long term (current) drug therapy: Secondary | ICD-10-CM | POA: Insufficient documentation

## 2020-08-28 DIAGNOSIS — N951 Menopausal and female climacteric states: Secondary | ICD-10-CM | POA: Insufficient documentation

## 2020-08-28 DIAGNOSIS — Z885 Allergy status to narcotic agent status: Secondary | ICD-10-CM | POA: Insufficient documentation

## 2020-08-28 DIAGNOSIS — N858 Other specified noninflammatory disorders of uterus: Secondary | ICD-10-CM | POA: Diagnosis not present

## 2020-08-28 DIAGNOSIS — Z87891 Personal history of nicotine dependence: Secondary | ICD-10-CM | POA: Diagnosis not present

## 2020-08-28 DIAGNOSIS — N950 Postmenopausal bleeding: Secondary | ICD-10-CM

## 2020-08-28 HISTORY — PX: HYSTEROSCOPY WITH D & C: SHX1775

## 2020-08-28 LAB — ABO/RH: ABO/RH(D): O NEG

## 2020-08-28 LAB — POCT PREGNANCY, URINE: Preg Test, Ur: NEGATIVE

## 2020-08-28 SURGERY — DILATATION AND CURETTAGE /HYSTEROSCOPY
Anesthesia: General

## 2020-08-28 MED ORDER — FENTANYL CITRATE (PF) 100 MCG/2ML IJ SOLN
25.0000 ug | INTRAMUSCULAR | Status: DC | PRN
  Administered 2020-08-28: 25 ug via INTRAVENOUS

## 2020-08-28 MED ORDER — ACETAMINOPHEN 500 MG PO TABS: 500.0000 mg | ORAL_TABLET | Freq: Four times a day (QID) | ORAL | 0 refills | Status: AC

## 2020-08-28 MED ORDER — LIDOCAINE HCL (CARDIAC) PF 100 MG/5ML IV SOSY
PREFILLED_SYRINGE | INTRAVENOUS | Status: DC | PRN
  Administered 2020-08-28: 80 mg via INTRAVENOUS

## 2020-08-28 MED ORDER — PROPOFOL 10 MG/ML IV BOLUS
INTRAVENOUS | Status: AC
  Filled 2020-08-28: qty 40

## 2020-08-28 MED ORDER — GLYCOPYRROLATE 0.2 MG/ML IJ SOLN
INTRAMUSCULAR | Status: AC
  Filled 2020-08-28: qty 1

## 2020-08-28 MED ORDER — SEVOFLURANE IN SOLN
RESPIRATORY_TRACT | Status: AC
  Filled 2020-08-28: qty 250

## 2020-08-28 MED ORDER — GLYCOPYRROLATE 0.2 MG/ML IJ SOLN
INTRAMUSCULAR | Status: DC | PRN
  Administered 2020-08-28: .1 mg via INTRAVENOUS

## 2020-08-28 MED ORDER — IBUPROFEN 600 MG PO TABS: 600.0000 mg | ORAL_TABLET | Freq: Four times a day (QID) | ORAL | 0 refills | Status: DC | PRN

## 2020-08-28 MED ORDER — EPHEDRINE SULFATE 50 MG/ML IJ SOLN
INTRAMUSCULAR | Status: DC | PRN
  Administered 2020-08-28 (×2): 2.5 mg via INTRAVENOUS

## 2020-08-28 MED ORDER — FENTANYL CITRATE (PF) 100 MCG/2ML IJ SOLN
INTRAMUSCULAR | Status: DC | PRN
  Administered 2020-08-28 (×2): 50 ug via INTRAVENOUS

## 2020-08-28 MED ORDER — ONDANSETRON HCL 4 MG/2ML IJ SOLN
INTRAMUSCULAR | Status: AC
  Filled 2020-08-28: qty 2

## 2020-08-28 MED ORDER — LIDOCAINE HCL (PF) 2 % IJ SOLN
INTRAMUSCULAR | Status: AC
  Filled 2020-08-28: qty 5

## 2020-08-28 MED ORDER — PROPOFOL 10 MG/ML IV BOLUS
INTRAVENOUS | Status: DC | PRN
  Administered 2020-08-28: 150 mg via INTRAVENOUS
  Administered 2020-08-28: 50 mg via INTRAVENOUS

## 2020-08-28 MED ORDER — PHENYLEPHRINE HCL (PRESSORS) 10 MG/ML IV SOLN
INTRAVENOUS | Status: DC | PRN
  Administered 2020-08-28: 100 ug via INTRAVENOUS

## 2020-08-28 MED ORDER — PROMETHAZINE HCL 25 MG/ML IJ SOLN: 6.2500 mg | INTRAMUSCULAR | Status: DC | PRN

## 2020-08-28 MED ORDER — EPHEDRINE 5 MG/ML INJ
INTRAVENOUS | Status: AC
  Filled 2020-08-28: qty 10

## 2020-08-28 MED ORDER — DEXAMETHASONE SODIUM PHOSPHATE 10 MG/ML IJ SOLN
INTRAMUSCULAR | Status: AC
  Filled 2020-08-28: qty 1

## 2020-08-28 MED ORDER — ONDANSETRON HCL 4 MG/2ML IJ SOLN
INTRAMUSCULAR | Status: DC | PRN
  Administered 2020-08-28: 4 mg via INTRAVENOUS

## 2020-08-28 MED ORDER — FENTANYL CITRATE (PF) 100 MCG/2ML IJ SOLN
INTRAMUSCULAR | Status: AC
  Administered 2020-08-28: 25 ug via INTRAVENOUS
  Filled 2020-08-28: qty 2

## 2020-08-28 MED ORDER — MIDAZOLAM HCL 2 MG/2ML IJ SOLN
INTRAMUSCULAR | Status: AC
  Filled 2020-08-28: qty 2

## 2020-08-28 MED ORDER — CHLORHEXIDINE GLUCONATE 0.12 % MT SOLN
OROMUCOSAL | Status: AC
  Administered 2020-08-28: 15 mL via OROMUCOSAL
  Filled 2020-08-28: qty 15

## 2020-08-28 MED ORDER — DEXAMETHASONE SODIUM PHOSPHATE 10 MG/ML IJ SOLN
INTRAMUSCULAR | Status: DC | PRN
  Administered 2020-08-28: 10 mg via INTRAVENOUS

## 2020-08-28 MED ORDER — SUCCINYLCHOLINE CHLORIDE 200 MG/10ML IV SOSY
PREFILLED_SYRINGE | INTRAVENOUS | Status: AC
  Filled 2020-08-28: qty 10

## 2020-08-28 MED ORDER — FENTANYL CITRATE (PF) 100 MCG/2ML IJ SOLN
INTRAMUSCULAR | Status: AC
  Filled 2020-08-28: qty 2

## 2020-08-28 MED ORDER — MIDAZOLAM HCL 2 MG/2ML IJ SOLN
INTRAMUSCULAR | Status: DC | PRN
  Administered 2020-08-28: 2 mg via INTRAVENOUS

## 2020-08-28 SURGICAL SUPPLY — 18 items
CATH ROBINSON RED A/P 16FR (CATHETERS) ×2 IMPLANT
DEVICE MYOSURE LITE (MISCELLANEOUS) IMPLANT
DEVICE MYOSURE REACH (MISCELLANEOUS) IMPLANT
ELECT REM PT RETURN 9FT ADLT (ELECTROSURGICAL)
ELECTRODE REM PT RTRN 9FT ADLT (ELECTROSURGICAL) IMPLANT
GAUZE 4X4 16PLY RFD (DISPOSABLE) ×2 IMPLANT
GLOVE SURG SYN 6.5 ES PF (GLOVE) ×2 IMPLANT
GLOVE SURG UNDER POLY LF SZ6.5 (GLOVE) ×4 IMPLANT
GOWN STRL REUS W/ TWL LRG LVL3 (GOWN DISPOSABLE) ×2 IMPLANT
GOWN STRL REUS W/TWL LRG LVL3 (GOWN DISPOSABLE) ×2
IV NS IRRIG 3000ML ARTHROMATIC (IV SOLUTION) ×2 IMPLANT
KIT PROCEDURE FLUENT (KITS) ×2 IMPLANT
MANIFOLD NEPTUNE II (INSTRUMENTS) ×2 IMPLANT
PACK DNC HYST (MISCELLANEOUS) ×2 IMPLANT
PAD OB MATERNITY 4.3X12.25 (PERSONAL CARE ITEMS) ×2 IMPLANT
PAD PREP 24X41 OB/GYN DISP (PERSONAL CARE ITEMS) ×2 IMPLANT
SEAL ROD LENS SCOPE MYOSURE (ABLATOR) ×2 IMPLANT
TOWEL OR 17X26 4PK STRL BLUE (TOWEL DISPOSABLE) ×2 IMPLANT

## 2020-08-28 NOTE — Transfer of Care (Signed)
Immediate Anesthesia Transfer of Care Note  Patient: Brianna [name]  Procedure(s) Performed: DILATATION AND CURETTAGE /HYSTEROSCOPY (N/A )  Patient Location: PACU  Anesthesia Type:General  Level of Consciousness: sedated  Airway & Oxygen Therapy: Patient Spontanous Breathing and Patient connected to face mask oxygen  Post-op Assessment: Report given to RN and Post -op Vital signs reviewed and stable  Post vital signs: Reviewed and stable  Last Vitals:  Vitals Value Taken Time  BP 157/96 08/28/20 0829  Temp    Pulse 93 08/28/20 0829  Resp 18 08/28/20 0829  SpO2 100 % 08/28/20 0829  Vitals shown include unvalidated device data.  Last Pain:  Vitals:   08/28/20 0829  PainSc: 0-No pain         Complications: No complications documented.

## 2020-08-28 NOTE — Op Note (Signed)
Operative Note  08/28/2020  PRE-OP DIAGNOSIS: Postmenopausal bleeding, abnormal uterine bleeding  POST-OP DIAGNOSIS: same   SURGEON: Sylus Stgermain MD  PROCEDURE: Procedure(s): DILATATION AND CURETTAGE /HYSTEROSCOPY   ANESTHESIA: General   ESTIMATED BLOOD LOSS: 19 cc   SPECIMENS:  Endometrial curettings  FLUID DEFICIT: 993 cc  COMPLICATIONS: None  DISPOSITION: PACU - hemodynamically stable.  CONDITION: stable  FINDINGS: Exam under anesthesia revealed  10 cm uterus with bilateral adnexa without masses or fullness. Hysteroscopy revealed a normal uterine cavity with bilateral tubal ostia and normal appearing endocervical canal.  PROCEDURE IN DETAIL: After informed consent was obtained, the patient was taken to the operating room where anesthesia was obtained without difficulty. The patient was positioned in the dorsal lithotomy position in [place]. The patient's bladder was catheterized with an in and out foley catheter. The patient was examined under anesthesia, with the above noted findings. The weightedspeculum was placed inside the patient's vagina, and the the anterior lip of the cervix was seen and grasped with the tenaculum.  The uterine cavity was sounded to 10 cm, and then the cervix was progressively dilated to a 18 French-Pratt dilator. The 0 degree hysteroscope was introduced, with saline fluid used to distend the intrauterine cavity, with the above noted findings.  The hysteroscope was removed.  The uterine cavity was curetted until a gritty texture was noted, yielding endometrial curettings. All instruments were removed, with excellent hemostasis noted throughout. She was then taken out of dorsal lithotomy. Minimal discrepancy in fluid was noted.  The patient tolerated the procedure well. Sponge, lap and needle counts were correct x2. The patient was taken to recovery room in excellent condition.  Adrian Prows MD [facility], [facility] 08/28/2020 8:32 AM

## 2020-08-28 NOTE — Interval H&P Note (Signed)
History and Physical Interval Note:  08/28/2020 7:09 AM  Brianna [name]  has presented today for surgery, with the diagnosis of Abnormal uterine bleeding N93.9 Postmenopausal bleeding N95.0.  The various methods of treatment have been discussed with the patient and family. After consideration of risks, benefits and other options for treatment, the patient has consented to  Procedure(s): DILATATION AND CURETTAGE /HYSTEROSCOPY (N/A) as a surgical intervention.  The patient's history has been reviewed, patient examined, no change in status, stable for surgery.  I have reviewed the patient's chart and labs.  Questions were answered to the patient's satisfaction.     [place]

## 2020-08-28 NOTE — Discharge Instructions (Signed)
Dilation and Curettage or Vacuum Curettage, Care After This sheet gives you information about how to care for yourself after your procedure. Your doctor may also give you more specific instructions. If you have problems or questions, contact your doctor. What can I expect after the procedure? After the procedure, it is common to have:  Mild pain or cramping.  Some bleeding or spotting from the vagina. These may last for up to 2 weeks. Follow these instructions at home: Medicines  Take over-the-counter and prescription medicines only as told by your doctor. This is very important if you take blood-thinning medicine.  Ask your doctor if the medicine prescribed to you requires you to avoid driving or using machinery. Activity  If you were given a medicine to help you relax (sedative) during your procedure, it can affect you for many hours. Do not drive or use machinery until your doctor says that it is safe.  Rest as told by your doctor.  Do not sit for a long time without moving. Get up to take short walks every 1-2 hours. This is important. Ask for help if you feel weak or unsteady.  Do not lift anything that is heavier than 10 lb (4.5 kg), or the limit that you are told, until your doctor says that it is safe.  Return to your normal activities as told by your doctor. Ask your doctor what activities are safe for you.   Lifestyle For at least 2 weeks, or as long as told by your doctor:  Do not douche.  Do not use tampons.  Do not have sex. General instructions  Wear compression stockings as told by your doctor.  It is up to you to get the results of your procedure. Ask your doctor, or the department that is doing the procedure, when your results will be ready.  Keep all follow-up visits as told by your doctor. This is important. Contact a doctor if:  You have very bad cramps that get worse or do not get better with medicine.  You have very bad pain in your belly  (abdomen).  You cannot drink fluids without vomiting.  You have pain in a different part of your pelvis. The pelvis is the area just above your thighs.  You have fluid from your vagina that smells bad.  You have a rash. Get help right away if:  You are bleeding a lot from your vagina. A lot of bleeding means soaking more than one sanitary pad in 1 hour for 2 hours in a row.  You have a fever that is above 100.31F (38.0C).  Your belly feels very tender or hard.  You have chest pain.  You have trouble breathing.  You feel dizzy.  You feel light-headed.  You pass out (faint).  You have pain in your neck or shoulder area. These symptoms may be an emergency. Do not wait to see if the symptoms will go away. Get medical help right away. Call your local emergency services (911 in the [place] Do not drive yourself to the hospital. Summary  After your procedure, it is common to have pain or cramping. It is also common to have bleeding or spotting from your vagina.  Rest as told. Do not sit for a long time without moving. Get up to take short walks every 1-2 hours.  Do not lift anything that is heavier than 10 lb (4.5 kg), or the limit that you are told.  Contact your doctor if you have fluid from  your vagina that smells bad.  Get help right away if you develop any problems from the procedure. Ask your doctor what problems to watch for. This information is not intended to replace advice given to you by your health care provider. Make sure you discuss any questions you have with your health care provider. Document Revised: 05/10/2019 Document Reviewed: 05/10/2019 Elsevier Patient Education  2021 [employer]   AMBULATORY SURGERY  DISCHARGE INSTRUCTIONS   1) The drugs that you were given will stay in your system until tomorrow so for the next 24 hours you should not:  A) Drive an automobile B) Make any legal decisions C) Drink any alcoholic beverage   2) You may resume  regular meals tomorrow.  Today it is better to start with liquids and gradually work up to solid foods.  You may eat anything you prefer, but it is better to start with liquids, then soup and crackers, and gradually work up to solid foods.   3) Please notify your doctor immediately if you have any unusual bleeding, trouble breathing, redness and pain at the surgery site, drainage, fever, or pain not relieved by medication.    4) Additional Instructions:    Please contact your physician with any problems or Same Day Surgery at [phone], Monday through Friday 6 am to 4 pm, or [facility] number at [phone]. 

## 2020-08-28 NOTE — Anesthesia Preprocedure Evaluation (Signed)
Anesthesia Evaluation  Patient identified by MRN, date of birth, ID band Patient awake    Reviewed: Allergy & Precautions, H&P , NPO status , reviewed documented beta blocker date and time   History of Anesthesia Complications Negative for: history of anesthetic complications  Airway Mallampati: III  TM Distance: >3 FB Neck ROM: full    Dental  (+) Caps, Chipped, Missing   Pulmonary neg shortness of breath, neg COPD, neg recent URI, former smoker,    Pulmonary exam normal        Cardiovascular Exercise Tolerance: Good negative cardio ROS Normal cardiovascular exam     Neuro/Psych PSYCHIATRIC DISORDERS Depression negative neurological ROS     GI/Hepatic Neg liver ROS, hiatal hernia, GERD  ,  Endo/Other  negative endocrine ROS  Renal/GU negative Renal ROS     Musculoskeletal   Abdominal   Peds  Hematology  (+) Blood dyscrasia, anemia ,   Anesthesia Other Findings Past Medical History: No date: Anemia No date: Chicken pox No date: Depression 06/17/2018: Iron deficiency anemia No date: Jaundice due to hepatitis No date: Restless leg syndrome  Past Surgical History: [phone]: CESAREAN SECTION 2003: CHOLECYSTECTOMY  BMI    Body Mass Index: 33.28 kg/m      Reproductive/Obstetrics                             Anesthesia Physical  Anesthesia Plan  ASA: II  Anesthesia Plan: General   Post-op Pain Management:    Induction: Intravenous  PONV Risk Score and Plan: 3 and Treatment may vary due to age or medical condition, Ondansetron, Dexamethasone, Midazolam and Promethazine  Airway Management Planned: LMA  Additional Equipment:   Intra-op Plan:   Post-operative Plan: Extubation in OR  Informed Consent: I have reviewed the patients History and Physical, chart, labs and discussed the procedure including the risks, benefits and alternatives for the proposed anesthesia  with the patient or authorized representative who has indicated his/her understanding and acceptance.     Dental Advisory Given  Plan Discussed with: CRNA  Anesthesia Plan Comments:         Anesthesia Quick Evaluation

## 2020-08-28 NOTE — Anesthesia Procedure Notes (Signed)
Procedure Name: LMA Insertion Date/Time: 08/28/2020 7:50 AM Performed by: Daiva Huge, RN Pre-anesthesia Checklist: Patient identified, Patient being monitored, Timeout performed, Emergency Drugs available and Suction available Patient Re-evaluated:Patient Re-evaluated prior to induction Oxygen Delivery Method: Circle system utilized Preoxygenation: Pre-oxygenation with 100% oxygen Induction Type: IV induction Ventilation: Mask ventilation without difficulty LMA: LMA inserted LMA Size: 3.5 Tube type: Oral Number of attempts: 1 Placement Confirmation: positive ETCO2 and breath sounds checked- equal and bilateral Tube secured with: Tape Dental Injury: Teeth and Oropharynx as per pre-operative assessment

## 2020-08-29 ENCOUNTER — Encounter: Payer: Self-pay | Admitting: Obstetrics and Gynecology

## 2020-08-29 NOTE — Anesthesia Postprocedure Evaluation (Signed)
Anesthesia Post Note  Patient: Palin Tristan  Procedure(s) Performed: DILATATION AND CURETTAGE /HYSTEROSCOPY (N/A )  Patient location during evaluation: PACU Anesthesia Type: General Level of consciousness: awake and alert Pain management: pain level controlled Vital Signs Assessment: post-procedure vital signs reviewed and stable Respiratory status: spontaneous breathing, nonlabored ventilation, respiratory function stable and patient connected to nasal cannula oxygen Cardiovascular status: blood pressure returned to baseline and stable Postop Assessment: no apparent nausea or vomiting Anesthetic complications: no   No complications documented.   Last Vitals:  Vitals:   08/28/20 0900 08/28/20 0919  BP: 126/90 132/85  Pulse: 71 73  Resp: 18 16  Temp: 36.6 C (!) 36.2 C  SpO2: 96% 100%    Last Pain:  Vitals:   08/28/20 0919  TempSrc: Temporal  PainSc: 3                  Martha Clan

## 2020-08-30 LAB — SURGICAL PATHOLOGY

## 2020-09-04 ENCOUNTER — Ambulatory Visit (INDEPENDENT_AMBULATORY_CARE_PROVIDER_SITE_OTHER): Payer: 59 | Admitting: Obstetrics and Gynecology

## 2020-09-04 ENCOUNTER — Other Ambulatory Visit: Payer: Self-pay

## 2020-09-04 ENCOUNTER — Encounter: Payer: Self-pay | Admitting: Obstetrics and Gynecology

## 2020-09-04 VITALS — BP 118/70 | Ht 64.0 in | Wt 212.2 lb

## 2020-09-04 DIAGNOSIS — N939 Abnormal uterine and vaginal bleeding, unspecified: Secondary | ICD-10-CM

## 2020-09-04 DIAGNOSIS — R232 Flushing: Secondary | ICD-10-CM

## 2020-09-04 MED ORDER — PROGESTERONE MICRONIZED 100 MG PO CAPS: 100.0000 mg | ORAL_CAPSULE | Freq: Every day | ORAL | 12 refills | Status: DC

## 2020-09-04 NOTE — Progress Notes (Signed)
  Postoperative Follow-up Patient presents post op from  Hysteroscopy with Dilation and Curettage  for  Postmenopausal bleeding , 1 week ago.  Subjective: Patient reports some improvement in her preop symptoms. Eating a regular diet without difficulty. Pain is controlled without any medications.  Activity: normal activities of daily living. Patient reports additional symptom's since surgery of None.  Objective: BP 118/70   Ht 5\' 4"  (1.626 m)   Wt 212 lb 3.2 oz (96.3 kg)   BMI 36.42 kg/m  Physical Exam Constitutional:      Appearance: Normal appearance. She is well-developed.  HENT:     Head: Normocephalic and atraumatic.  Eyes:     Extraocular Movements: Extraocular movements intact.     Pupils: Pupils are equal, round, and reactive to light.  Neck:     Thyroid: No thyromegaly.  Cardiovascular:     Rate and Rhythm: Normal rate and regular rhythm.     Heart sounds: Normal heart sounds.  Pulmonary:     Effort: Pulmonary effort is normal.     Breath sounds: Normal breath sounds.  Abdominal:     General: Bowel sounds are normal. There is no distension.     Palpations: Abdomen is soft. There is no mass.  Musculoskeletal:     Cervical back: Neck supple.  Neurological:     Mental Status: She is alert and oriented to person, place, and time.  Skin:    General: Skin is warm and dry.  Psychiatric:        Behavior: Behavior normal.        Thought Content: Thought content normal.        Judgment: Judgment normal.  Vitals reviewed.     Assessment: s/p :  Hysteroscopy D&C stable  Plan: Patient has done well after surgery with no apparent complications.  I have discussed the post-operative course to date, and the expected progress moving forward.  The patient understands what complications to be concerned about.  I will see the patient in routine follow up, or sooner if needed.    Activity plan: No restriction. She has been using a Mirena IUD for endometrial replacement therapy.   Since her IUD fell out with her recent heavy bleeding she has been taking oral provera 10 mg a day.  We will transition her to Prometrium 100 mg daily for endometrial protection. She is still having hot flashes at night. She will let me know in 1-2 months if we need to titrate dose up again or not  Adrian Prows MD, [place], [place] 09/04/2020 11:54 AM

## 2020-09-05 ENCOUNTER — Ambulatory Visit: Payer: 59

## 2020-09-08 ENCOUNTER — Other Ambulatory Visit: Payer: Self-pay | Admitting: Obstetrics and Gynecology

## 2020-09-21 ENCOUNTER — Ambulatory Visit: Payer: 59 | Admitting: Pharmacist

## 2020-09-21 ENCOUNTER — Telehealth: Payer: Self-pay | Admitting: Internal Medicine

## 2020-09-21 DIAGNOSIS — K219 Gastro-esophageal reflux disease without esophagitis: Secondary | ICD-10-CM

## 2020-09-21 DIAGNOSIS — R7301 Impaired fasting glucose: Secondary | ICD-10-CM

## 2020-09-21 DIAGNOSIS — Z6836 Body mass index (BMI) 36.0-36.9, adult: Secondary | ICD-10-CM

## 2020-09-21 MED ORDER — OSELTAMIVIR PHOSPHATE 75 MG PO CAPS: 75.0000 mg | ORAL_CAPSULE | Freq: Every day | ORAL | 0 refills | Status: DC

## 2020-09-21 MED ORDER — OZEMPIC (0.25 OR 0.5 MG/DOSE) 2 MG/1.5ML ~~LOC~~ SOPN
0.5000 mg | PEN_INJECTOR | SUBCUTANEOUS | 2 refills | Status: DC
Start: 2020-09-21 — End: 2020-12-10

## 2020-09-21 NOTE — Chronic Care Management (AMB) (Signed)
Care Management   Pharmacy Note  09/21/2020 Name: Brianna [name] MRN: [id number] DOB: [birth year]  Subjective: Brianna [name] is a 50 y.o. year old female who is a primary care patient of Einar Pheasant, MD. The Care Management team was consulted for assistance with care management and care coordination needs.    Engaged with patient by telephone for follow up visit in response to provider referral for pharmacy case management and/or care coordination services.   The patient was given information about Care Management services today including:  1. Care Management services includes personalized support from designated clinical staff supervised by the patient's primary care provider, including individualized plan of care and coordination with other care providers. 2. 24/7 contact phone numbers for assistance for urgent and routine care needs. 3. The patient may stop case management services at any time by phone call to the office staff.  Patient agreed to services and consent obtained.  Assessment:  Review of patient status, including review of consultants reports, laboratory and other test data, was performed as part of comprehensive evaluation and provision of chronic care management services.   SDOH (Social Determinants of Health) assessments and interventions performed:    Objective:  Lab Results  Component Value Date   CREATININE 0.73 06/01/2019   CREATININE 0.47 05/25/2018    No results found for: HGBA1C     Component Value Date/Time   CHOL 226 (H) 06/01/2019 1045   TRIG 199.0 (H) 06/01/2019 1045   HDL 59.80 06/01/2019 1045   CHOLHDL 4 06/01/2019 1045   VLDL 39.8 06/01/2019 1045   LDLCALC 126 (H) 06/01/2019 1045     Clinical ASCVD: No  The 10-year ASCVD risk score Mikey Bussing DC Jr., et al., 2013) is: 1.2%   Values used to calculate the score:     Age: 34 years     Sex: Female     Is Non-Hispanic African American: No     Diabetic: No     Tobacco smoker: No     Systolic  Blood Pressure: 118 mmHg     Is BP treated: No     HDL Cholesterol: 59.8 mg/dL     Total Cholesterol: 244 mg/dL     BP Readings from Last 3 Encounters:  09/04/20 118/70  08/28/20 132/85  08/16/20 120/80    Care Plan  Allergies  Allergen Reactions  . Hydrocodone Hives    Medications Reviewed Today    Reviewed by Homero Fellers, MD (Physician) on 09/04/20 at 1154  Med List Status: <None>  Medication Order Taking? Sig Documenting Provider Last Dose Status Informant  acetaminophen (TYLENOL) 500 MG tablet [id number] Yes Take 1 tablet (500 mg total) by mouth every 6 (six) hours. Homero Fellers, MD Taking Active   clobetasol ointment (TEMOVATE) 0.05 % [id number] Yes Apply to affected area twice a day for 4 weeks, then every night for 4 weeks and then twice a week for 4 weeks or until resolution.  Patient taking differently: Apply 1 application topically daily as needed (rash).   Homero Fellers, MD Taking Active   estradiol (CLIMARA - DOSED IN MG/24 HR) 0.1 mg/24hr patch [id number] Yes Place 1 patch (0.1 mg total) onto the skin once a week. Homero Fellers, MD Taking Active   ibuprofen (ADVIL) 600 MG tablet [id number] Yes Take 1 tablet (600 mg total) by mouth every 6 (six) hours as needed. Homero Fellers, MD Taking Active   ibuprofen (ADVIL) 800 MG tablet [id number] Yes Take 800 mg by mouth daily  as needed for moderate pain. [provider] Taking Active Self  Insulin Pen Needle (PEN NEEDLES) 32G X 4 MM MISC [id number] Yes Use to inject Saxenda daily Einar Pheasant, MD Taking Active Self  Liraglutide -Weight Management (SAXENDA) 18 MG/3ML SOPN [id number] No Inject 0.6 mg daily for 1 week, then increase to 1.2 mg daily. Increase at weekly intervals as tolerated until maximum dose of 3 mg daily.  Patient not taking: Reported on 09/04/2020   Einar Pheasant, MD Not Taking Active            Med Note Isidor Holts   Tue Aug 21, 2020 10:30 AM)     medroxyPROGESTERone (PROVERA) 10 MG tablet [id number] Yes Take 1 tablet (10 mg total) by mouth daily. Homero Fellers, MD Taking Active Self  omeprazole (PRILOSEC) 20 MG capsule [id number] Yes Take 1 capsule (20 mg total) by mouth 2 (two) times daily before a meal. Take 1 capsule (20 mg total) two times a day for 30 days and then take 1 capsule a day for two months.  Patient taking differently: Take 20 mg by mouth daily with breakfast.   Virgel Manifold, MD Taking Active            Med Note ([place], Mercedes Aug 21, 2020 10:28 AM)    ondansetron (ZOFRAN) 4 MG tablet [id number] No Take 1 tablet (4 mg total) by mouth every 8 (eight) hours as needed for nausea or vomiting.  Patient not taking: Reported on 09/04/2020   Rod Can, CNM Not Taking Active Self  OVER THE COUNTER MEDICATION [id number] Yes Take 135 mg by mouth at bedtime. MaryRuth's liposomal magnesium 135mg /62ml [provider] Taking Active Self  OVER THE COUNTER MEDICATION [id number] Yes Take 18 mg by mouth at bedtime. MaryRuth's liquid iron 18mg /37ml [provider] Taking Active Self  PARoxetine (PAXIL) 10 MG tablet [id number] Yes Take 1 tablet (10 mg total) by mouth daily. Homero Fellers, MD Taking Active Self  Polyethyl Glycol-Propyl Glycol (SYSTANE ULTRA OP) [id number] Yes Place 1 drop into both eyes every 6 (six) hours as needed (dry eyes). [provider] Taking Active Self  progesterone (PROMETRIUM) 100 MG capsule [id number] Yes Take 1 capsule (100 mg total) by mouth daily. Homero Fellers, MD  Active           Patient Active Problem List   Diagnosis Date Noted  . Abnormal uterine bleeding   . Postmenopausal bleeding   . Hiatal hernia 06/20/2020  . Stress 02/19/2020  . Dysphagia   . Barrett's esophagus without dysplasia   . Stomach irritation   . Weight gain 07/17/2019  . Varicose veins of leg with pain, bilateral 07/17/2019  . GERD (gastroesophageal reflux  disease) 06/05/2019  . Menstrual changes 06/05/2019  . Encounter for screening colonoscopy 06/05/2019  . Sweating increase 06/05/2019  . Leiomyoma 06/29/2018  . Iron deficiency anemia 06/17/2018  . Restless leg 05/26/2018    Conditions to be addressed/monitored: Anxiety, Depression and Weight managemnet  Care Plan : Medication Management  Updates made by De Hollingshead, RPH-CPP since 09/21/2020 12:00 AM    Problem: Weight Management, Anxiety, Menopause     Long-Range Goal: Disease Progression Prevention   This Visit's Progress: On track  Recent Progress: On track  Priority: High  Note:   Current Barriers:  . Unable to independently achieve control of weight   Pharmacist Clinical Goal(s):  Marland Kitchen Over the next 90 days, patient will maintain control of weight  as evidenced through collaboration with PharmD and provider.   Interventions: . 1:1 collaboration with Einar Pheasant, MD regarding development and update of comprehensive plan of care as evidenced by provider attestation and co-signature . Inter-disciplinary care team collaboration (see longitudinal plan of care) . Comprehensive medication review performed; medication list updated in electronic medical record   Obesity, complicated by medical conditions including GERD, family hx ASCVD, HLD: . Unable to obtain a goal weight loss of 5-10% with diet and physical activity; current treatment:  None; previously doing well on Saxenda but she changed insurances, current insurance no longer covers Weight Management therapy . Baseline weight: 212 lbs;  Marland Kitchen She and partner are focusing on lean proteins, low carbohydrate . Exercise: targets 10,000 steps daily at work. Active job. Important to focus on moderate to high intensity physical activity for at least 150 minutes weekly.  . Continue to focus on choosing lower carbohydrate options.  . Discussed option to prescribe off-label GLP1. Discussed potential insurance ramifications. Patient  amenable. PCP amenable. Sent script for Ozempic 0.25 mg weekly x 2 weeks, then increase to Ozempic 0.5 mg weekly. Contacted pharmacy, no PA required and they will fill. Patient aware and will pick up  Anxiety: . Controlled per patient report; current treatment: paroxetine 10 mg daily;  . Previously recommended to continue current regmen at this time  Menopausal Symptoms with Recent hysteroscopy . Improved per patient report; estradiol 1 mg weekly patch, progesterone 200 mg x 10 days per week (fluid retention on daily dosing) . Encouraged continued adherence and follow up with gynecology.   Barrett's Esophagus, GERD . Controlled per patient report; current regimen: omeprazole 20 mg BID . Continue current regimen at this time along with avoidance of trigger foods.  Patient Goals/Self-Care Activities . Over the next 90 days, patient will:  - take medications as prescribed target a minimum of 150 minutes of moderate intensity exercise weekly engage in dietary modifications by continuing to moderate portion sizes  Follow Up Plan: Telephone follow up appointment with care management team member scheduled for: ~ 8 weeks      Medication Assistance:  None required.  Patient affirms current coverage meets needs.  Follow Up:  Patient agrees to Care Plan and Follow-up.  Plan: Telephone follow up appointment with care management team member scheduled for:  ~ 8 weeks  Catie Darnelle Maffucci, PharmD, [place], [place] Clinical Pharmacist [employer] [facility] [employer] [phone]

## 2020-09-21 NOTE — Patient Instructions (Signed)
Visit Information  Goals Addressed              This Visit's Progress     Patient Stated   .  Medication Management (pt-stated)        Patient Goals/Self-Care Activities . Over the next 90 days, patient will:  - take medications as prescribed target a minimum of 150 minutes of moderate intensity exercise weekly engage in dietary modifications by continuing to moderate portion sizes        Patient verbalizes understanding of instructions provided today and agrees to view in [place].   Plan: Telephone follow up appointment with care management team member scheduled for:  ~ 8 weeks  Catie Darnelle Maffucci, PharmD, [place], [place] Clinical Pharmacist [employer] at [employer] [facility] [phone]

## 2020-09-21 NOTE — Telephone Encounter (Signed)
Husband diagnosed with the flu.  She is not having any symptoms.  Discussed tamiflu prophylaxis.  Will send in prescription with instruction to take tamiflu one q day for 10 days.  Will call if problems.

## 2020-09-22 ENCOUNTER — Other Ambulatory Visit: Payer: Self-pay | Admitting: Obstetrics and Gynecology

## 2020-10-15 ENCOUNTER — Other Ambulatory Visit: Payer: Self-pay | Admitting: Obstetrics and Gynecology

## 2020-10-15 DIAGNOSIS — N939 Abnormal uterine and vaginal bleeding, unspecified: Secondary | ICD-10-CM

## 2020-10-17 ENCOUNTER — Other Ambulatory Visit: Payer: Self-pay | Admitting: Obstetrics and Gynecology

## 2020-10-17 DIAGNOSIS — B3731 Acute candidiasis of vulva and vagina: Secondary | ICD-10-CM

## 2020-10-17 MED ORDER — FLUCONAZOLE 150 MG PO TABS: 150.0000 mg | ORAL_TABLET | ORAL | 0 refills | Status: AC

## 2020-11-02 ENCOUNTER — Telehealth: Payer: Self-pay | Admitting: Pharmacist

## 2020-11-02 ENCOUNTER — Telehealth: Payer: 59

## 2020-11-02 NOTE — Telephone Encounter (Signed)
  Chronic Care Management   Note  11/02/2020 Name: Brianna [name] MRN: [id number] DOB: [birth year]   Attempted to contact patient for scheduled appointment for medication management support. Left HIPAA compliant message for patient to return my call at their convenience.    Plan: - If I do not hear back from the patient by end of business today, will collaborate with Care Guide to outreach to schedule follow up with me  Catie Darnelle Maffucci, PharmD, [place], [place] Pharmacist [employer] at [facility] [employer] [facility] [phone]

## 2020-11-06 ENCOUNTER — Telehealth: Payer: Self-pay

## 2020-11-06 NOTE — Chronic Care Management (AMB) (Signed)
  Care Management   Note  11/06/2020 Name: Chakara Bognar MRN: [id number] DOB: 1971-03-24  Brianna [name] is a 50 y.o. year old female who is a primary care patient of Einar Pheasant, MD and is actively engaged with the care management team. I reached out to [name] by phone today to assist with re-scheduling a follow up visit with the Pharmacist  Follow up plan: Unsuccessful telephone outreach attempt made. A HIPAA compliant phone message was left for the patient providing contact information and requesting a return call.  The care management team will reach out to the patient again over the next 7 days.  If patient returns call to provider office, please advise to call [facility]  at [place], [place], [place], [place] [address] Direct Dial: [phone] [email or web address]: [email or web address]

## 2020-11-13 NOTE — Chronic Care Management (AMB) (Signed)
  Care Management   Note  11/13/2020 Name: Kyriana Wirz MRN: [id number] DOB: 06-Sep-1970  Annesia Aills is a 50 y.o. year old female who is a primary care patient of Einar Pheasant, MD and is actively engaged with the care management team. I reached out to [name] by phone today to assist with re-scheduling a follow up visit with the Pharmacist  Follow up plan: Unsuccessful telephone outreach attempt made. A HIPAA compliant phone message was left for the patient providing contact information and requesting a return call.  The care management team will reach out to the patient again over the next 7 days.  If patient returns call to provider office, please advise to call [facility]  at [place], [place], [place],  [address] Direct Dial: [phone] [email or web address]: [email or web address]

## 2020-11-19 NOTE — Chronic Care Management (AMB) (Signed)
  Care Management   Note  11/19/2020 Name: Brianna [name] MRN: [id number] DOB: [birth year]  Brianna [name] is a 50 y.o. year old female who is a primary care patient of Einar Pheasant, MD and is actively engaged with the care management team. I reached out to [name] by phone today to assist with re-scheduling a follow up visit with the Pharmacist  Follow up plan: Telephone appointment with care management team member scheduled for:12/25/2020  Noreene Larsson, [place], [place], [place] [address] Direct Dial: [phone] [email or web address]: [email or web address]

## 2020-11-20 ENCOUNTER — Other Ambulatory Visit: Payer: Self-pay

## 2020-11-20 DIAGNOSIS — R519 Headache, unspecified: Secondary | ICD-10-CM | POA: Diagnosis present

## 2020-11-20 DIAGNOSIS — Z87891 Personal history of nicotine dependence: Secondary | ICD-10-CM | POA: Insufficient documentation

## 2020-11-20 DIAGNOSIS — G43909 Migraine, unspecified, not intractable, without status migrainosus: Secondary | ICD-10-CM | POA: Insufficient documentation

## 2020-11-20 LAB — CBC WITH DIFFERENTIAL/PLATELET
Abs Immature Granulocytes: 0.02 10*3/uL (ref 0.00–0.07)
Basophils Absolute: 0.1 10*3/uL (ref 0.0–0.1)
Basophils Relative: 0 %
Eosinophils Absolute: 0.1 10*3/uL (ref 0.0–0.5)
Eosinophils Relative: 1 %
HCT: 39.6 % (ref 36.0–46.0)
Hemoglobin: 13.2 g/dL (ref 12.0–15.0)
Immature Granulocytes: 0 %
Lymphocytes Relative: 21 %
Lymphs Abs: 2.4 10*3/uL (ref 0.7–4.0)
MCH: 29.9 pg (ref 26.0–34.0)
MCHC: 33.3 g/dL (ref 30.0–36.0)
MCV: 89.6 fL (ref 80.0–100.0)
Monocytes Absolute: 0.6 10*3/uL (ref 0.1–1.0)
Monocytes Relative: 5 %
Neutro Abs: 8.2 10*3/uL — ABNORMAL HIGH (ref 1.7–7.7)
Neutrophils Relative %: 73 %
Platelets: 273 10*3/uL (ref 150–400)
RBC: 4.42 MIL/uL (ref 3.87–5.11)
RDW: 13.2 % (ref 11.5–15.5)
WBC: 11.3 10*3/uL — ABNORMAL HIGH (ref 4.0–10.5)
nRBC: 0 % (ref 0.0–0.2)

## 2020-11-20 LAB — BASIC METABOLIC PANEL
Anion gap: 6 (ref 5–15)
BUN: 12 mg/dL (ref 6–20)
CO2: 24 mmol/L (ref 22–32)
Calcium: 8.3 mg/dL — ABNORMAL LOW (ref 8.9–10.3)
Chloride: 104 mmol/L (ref 98–111)
Creatinine, Ser: 0.63 mg/dL (ref 0.44–1.00)
GFR, Estimated: >60 mL/min (ref >60)
Glucose, Bld: 113 mg/dL — ABNORMAL HIGH (ref 70–99)
Potassium: 3.5 mmol/L (ref 3.5–5.1)
Sodium: 134 mmol/L — ABNORMAL LOW (ref 135–145)

## 2020-11-20 LAB — TROPONIN I (HIGH SENSITIVITY)
Troponin I (High Sensitivity): 3 ng/L (ref <18)
Troponin I (High Sensitivity): 3 ng/L (ref <18)

## 2020-11-20 NOTE — ED Triage Notes (Signed)
Pt states that she began to have a headache earlier this afternoon and then felt dizzy, pt states she took her BP and it was 163/112. Pt has no hx of HTN. Pt states vision is at baseline at this moment, speech clear, pt able to move all extremities at baseline. Pt states she still has a headache and feels nauseous.

## 2020-11-21 ENCOUNTER — Emergency Department
Admission: EM | Admit: 2020-11-21 | Discharge: 2020-11-21 | Disposition: A | Discharge status: Home / Self Care | Payer: 59 | Attending: Emergency Medicine | Admitting: Emergency Medicine

## 2020-11-21 ENCOUNTER — Telehealth: Payer: Self-pay | Admitting: Internal Medicine

## 2020-11-21 ENCOUNTER — Other Ambulatory Visit: Payer: Self-pay | Admitting: Internal Medicine

## 2020-11-21 DIAGNOSIS — G43909 Migraine, unspecified, not intractable, without status migrainosus: Secondary | ICD-10-CM

## 2020-11-21 MED ORDER — METOCLOPRAMIDE HCL 5 MG/ML IJ SOLN
10.0000 mg | INTRAMUSCULAR | Status: AC
  Administered 2020-11-21: 10 mg via INTRAVENOUS
  Filled 2020-11-21: qty 2

## 2020-11-21 MED ORDER — KETOROLAC TROMETHAMINE 30 MG/ML IJ SOLN
15.0000 mg | Freq: Once | INTRAMUSCULAR | Status: AC
  Administered 2020-11-21: 15 mg via INTRAVENOUS
  Filled 2020-11-21: qty 1

## 2020-11-21 MED ORDER — SODIUM CHLORIDE 0.9 % IV BOLUS
500.0000 mL | Freq: Once | INTRAVENOUS | Status: AC
  Administered 2020-11-21: 500 mL via INTRAVENOUS

## 2020-11-21 MED ORDER — DEXAMETHASONE SODIUM PHOSPHATE 10 MG/ML IJ SOLN
10.0000 mg | Freq: Once | INTRAMUSCULAR | Status: AC
  Administered 2020-11-21: 10 mg via INTRAVENOUS
  Filled 2020-11-21: qty 1

## 2020-11-21 MED ORDER — DIPHENHYDRAMINE HCL 50 MG/ML IJ SOLN
12.5000 mg | INTRAMUSCULAR | Status: AC
  Administered 2020-11-21: 12.5 mg via INTRAVENOUS
  Filled 2020-11-21: qty 1

## 2020-11-21 NOTE — Discharge Instructions (Addendum)
You have been seen in the Emergency Department (ED) for a migraine.  Please use Tylenol or Motrin as needed for symptoms, but only as written on the box, and take any regular medications that have been prescribed for you. As we have discussed, please follow up with your doctor as soon as possible regarding today's ED visit and your headache symptoms.    Call your doctor or return to the Emergency Department (ED) if you have a worsening headache, sudden and severe headache, confusion, slurred speech, facial droop, weakness or numbness in any arm or leg, extreme fatigue, or other symptoms that concern you.

## 2020-11-21 NOTE — Telephone Encounter (Signed)
Appointment For: Brianna [name] ([id number])  Visit Type: OFFICE VISIT (1004)    02/19/2021    1:30 PM  30 mins.  Einar Pheasant, MD       [facility]    Patient Comments:  Migraines

## 2020-11-21 NOTE — ED Provider Notes (Signed)
[facility] Emergency Department Provider Note  ____________________________________________   Event Date/Time   First MD Initiated Contact with Patient 11/21/20 [phone]     (approximate)  I have reviewed the triage vital signs and the nursing notes.   HISTORY  Chief Complaint Hypertension    HPI Brianna [name] is a 50 y.o. female with medical history as listed below who presents for evaluation of headache with associated symptoms and high blood pressure.  She said that yesterday she felt a little bit off while she was shopping at [employer], with a generalized headache, light making the headache worse, and a little bit lightheaded or dizzy.  She was also nauseated.  Nothing in particular made it better or worse but she did not think much of it and went to bed.  She felt better today but later in the afternoon she started to feel worse again with the same kinds of symptoms.  She has had no focal weakness or numbness and no difficulty with ambulation but she still feels little bit lightheaded and dizzy, sensitive to light, nauseated, and by tonight had a severe sharp headache behind her eyes.  She checked her blood pressure several times at home and each time she checked it it got higher and higher until eventually was in the [id number] systolic and possibly as high as [id number] diastolic.  She denies fever, chest pain, abdominal pain, and dysuria.  She has had migraines in the past although they feel little bit different than this.     Past Medical History:  Diagnosis Date   Anemia    Chicken pox    Depression    GERD (gastroesophageal reflux disease)    Iron deficiency anemia 06/17/2018   Jaundice due to hepatitis    Restless leg syndrome     Patient Active Problem List   Diagnosis Date Noted   Abnormal uterine bleeding    Postmenopausal bleeding    Hiatal hernia 06/20/2020   Stress 02/19/2020   Dysphagia    Barrett's esophagus without dysplasia    Stomach  irritation    Weight gain 07/17/2019   Varicose veins of leg with pain, bilateral 07/17/2019   GERD (gastroesophageal reflux disease) 06/05/2019   Menstrual changes 06/05/2019   Encounter for screening colonoscopy 06/05/2019   Sweating increase 06/05/2019   Leiomyoma 06/29/2018   Iron deficiency anemia 06/17/2018   Restless leg 05/26/2018    Past Surgical History:  Procedure Laterality Date   CESAREAN SECTION  [phone]   CHOLECYSTECTOMY  2003   COLONOSCOPY WITH PROPOFOL N/A 08/22/2019   Procedure: COLONOSCOPY WITH PROPOFOL;  Surgeon: Virgel Manifold, MD;  Location: [facility] ENDOSCOPY;  Service: Endoscopy;  Laterality: N/A;   ESOPHAGOGASTRODUODENOSCOPY (EGD) WITH PROPOFOL N/A 08/22/2019   Procedure: ESOPHAGOGASTRODUODENOSCOPY (EGD) WITH PROPOFOL;  Surgeon: Virgel Manifold, MD;  Location: [facility] ENDOSCOPY;  Service: Endoscopy;  Laterality: N/A;   HERNIA REPAIR     HYSTEROSCOPY WITH D & C N/A 08/28/2020   Procedure: DILATATION AND CURETTAGE /HYSTEROSCOPY;  Surgeon: Homero Fellers, MD;  Location: [facility] ORS;  Service: Gynecology;  Laterality: N/A;    Prior to Admission medications   Medication Sig Start Date End Date Taking? Authorizing Provider  acetaminophen (TYLENOL) 500 MG tablet Take 1 tablet (500 mg total) by mouth every 6 (six) hours. 08/28/20   Schuman, Stefanie Libel, MD  clobetasol ointment (TEMOVATE) 0.05 % Apply to affected area twice a day for 4 weeks, then every night for 4 weeks and then twice a week  for 4 weeks or until resolution. Patient taking differently: Apply 1 application topically daily as needed (rash). 01/17/20   Schuman, Stefanie Libel, MD  estradiol (CLIMARA - DOSED IN MG/24 HR) 0.1 mg/24hr patch Place 1 patch (0.1 mg total) onto the skin once a week. 08/24/20   Schuman, Christanna R, MD  ibuprofen (ADVIL) 600 MG tablet TAKE ONE TABLET BY MOUTH EVERY 6 HOURS AS NEEDED 09/24/20   Schuman, Christanna R, MD  ibuprofen (ADVIL) 800 MG tablet Take 800 mg by mouth  daily as needed for moderate pain. 08/11/20   [provider]  medroxyPROGESTERone (PROVERA) 10 MG tablet TAKE ONE TABLET BY MOUTH DAILY 10/17/20   Schuman, Stefanie Libel, MD  omeprazole (PRILOSEC) 20 MG capsule Take 1 capsule (20 mg total) by mouth 2 (two) times daily before a meal. Take 1 capsule (20 mg total) two times a day for 30 days and then take 1 capsule a day for two months. Patient taking differently: Take 20 mg by mouth daily with breakfast. 09/05/19   Virgel Manifold, MD  ondansetron (ZOFRAN) 4 MG tablet Take 1 tablet (4 mg total) by mouth every 8 (eight) hours as needed for nausea or vomiting. Patient not taking: Reported on 09/04/2020 08/13/20   Rod Can, CNM  oseltamivir (TAMIFLU) 75 MG capsule Take 1 capsule (75 mg total) by mouth daily. 09/21/20   Einar Pheasant, MD  OVER THE COUNTER MEDICATION Take 135 mg by mouth at bedtime. MaryRuth's liposomal magnesium '135mg'$ /68m    [provider]  OVER THE COUNTER MEDICATION Take 18 mg by mouth at bedtime. MaryRuth's liquid iron '18mg'$ /157m   [provider]  PARoxetine (PAXIL) 10 MG tablet Take 1 tablet (10 mg total) by mouth daily. 07/06/20 07/06/21  Schuman, ChStefanie LibelMD  Polyethyl Glycol-Propyl Glycol (SYSTANE ULTRA OP) Place 1 drop into both eyes every 6 (six) hours as needed (dry eyes).    [provider]  progesterone (PROMETRIUM) 100 MG capsule Take 1 capsule (100 mg total) by mouth daily. 09/04/20   Schuman, ChStefanie LibelMD  Semaglutide,0.25 or 0.'5MG'$ /DOS, (OZEMPIC, 0.25 OR 0.5 MG/DOSE,) 2 MG/1.5ML SOPN Inject 0.5 mg into the skin once a week. 09/21/20   ScEinar PheasantMD    Allergies Hydrocodone  Family History  Problem Relation Age of Onset   Early death Mother    Heart disease Mother    Hyperlipidemia Mother    Hypertension Mother    Alcohol abuse Father    Arthritis Father    COPD Father    Hyperlipidemia Father    Hypertension Father    Kidney disease Father    Arthritis  Maternal Grandmother    Stroke Maternal Grandmother    Hyperlipidemia Maternal Grandfather    Breast cancer Paternal Aunt 1934  Social History Social History   Tobacco Use   Smoking status: Former   Smokeless tobacco: Never  VaScientific laboratory technicianse: Never used  Substance Use Topics   Alcohol use: Yes    Alcohol/week: 7.0 standard drinks    Types: 7 Glasses of wine per week    Comment: occasional    Drug use: Never    Review of Systems Constitutional: No fever/chills Eyes: Positive for sensitivity to light. ENT: No sore throat. Cardiovascular: Denies chest pain. Respiratory: Denies shortness of breath. Gastrointestinal: Positive for nausea.  Negative for vomiting and abdominal pain. Genitourinary: Negative for dysuria. Musculoskeletal: Negative for neck pain.  Negative for back pain. Integumentary: Negative for rash. Neurological:  Positive for sharp stabbing pain behind her eyes, negative for focal numbness and weakness.   ____________________________________________   PHYSICAL EXAM:  VITAL SIGNS: ED Triage Vitals  Enc Vitals Group     BP 11/20/20 2103 (!) 172/107     Pulse Rate 11/20/20 2103 94     Resp 11/20/20 2103 18     Temp 11/20/20 2103 98.1 F (36.7 C)     Temp Source 11/21/20 0029 Oral     SpO2 11/20/20 2103 99 %     Weight 11/20/20 2101 92.5 kg (204 lb)     Height 11/20/20 2101 1.626 m ('5\' 4"'$ )     Head Circumference --      Peak Flow --      Pain Score 11/20/20 2101 7     Pain Loc --      Pain Edu? --      Excl. in [place]? --     Constitutional: Alert and oriented.  Eyes: Conjunctivae are normal.  Extraocular motion is intact, pupils are equal.  Patient endorses photophobia. Head: Atraumatic. Nose: No congestion/rhinnorhea. Mouth/Throat: Patient is wearing a mask. Neck: No stridor.  No meningeal signs.   Cardiovascular: Normal rate, regular rhythm. Good peripheral circulation. Respiratory: Normal respiratory effort.  No  retractions. Gastrointestinal: Soft and nontender. No distention.  Musculoskeletal: No lower extremity tenderness nor edema. No gross deformities of extremities. Neurologic:  Normal speech and language. No gross focal neurologic deficits are appreciated.  Skin:  Skin is warm, dry and intact. Psychiatric: Mood and affect are normal. Speech and behavior are normal.  ____________________________________________   LABS (all labs ordered are listed, but only abnormal results are displayed)  Labs Reviewed  CBC WITH DIFFERENTIAL/PLATELET - Abnormal; Notable for the following components:      Result Value   WBC 11.3 (*)    Neutro Abs 8.2 (*)    All other components within normal limits  BASIC METABOLIC PANEL - Abnormal; Notable for the following components:   Sodium 134 (*)    Glucose, Bld 113 (*)    Calcium 8.3 (*)    All other components within normal limits  TROPONIN I (HIGH SENSITIVITY)  TROPONIN I (HIGH SENSITIVITY)   ____________________________________________  EKG  ED ECG REPORT I, Hinda Kehr, the attending physician, personally viewed and interpreted this ECG.  Date: 11/20/2020 EKG Time: 21: 04 Rate: 101 Rhythm: Borderline sinus tachycardia QRS Axis: normal Intervals: normal ST/T Wave abnormalities: normal Narrative Interpretation: no evidence of acute ischemia  ____________________________________________   PROCEDURES   Procedure(s) performed (including Critical Care):  Procedures   ____________________________________________   INITIAL IMPRESSION / MDM / ASSESSMENT AND PLAN / ED COURSE  As part of my medical decision making, I reviewed the following data within the [facility] notes reviewed and incorporated, Labs reviewed , Old chart reviewed, and Notes from prior ED visits   Differential diagnosis includes, but is not limited to, migraine, intracranial hemorrhage, tension headache, cluster headache, nonspecific headache,  undiagnosed primary hypertension.  Patient's blood pressure has come down substantially to the 150/95 range.  She said that her symptoms are better but she still feels sensitive to light and still has a headache as well as some mild nausea.  EKG is generally reassuring.  Heart rate is come down to the 80s without any treatment and there is no sign of ischemia.  Basic metabolic panel is reassuring, CBC is essentially normal, and high-sensitivity troponins x2 are normal.  We discussed it and agreed to  proceed with migraine treatment.  I had my usual and customary hypertension discussion with the patient as well.  We will proceed with the following: Normal saline 500 mL bolus, metoclopramide 10 mg IV, Toradol 15 mg IV, Decadron 10 mg IV.  Anticipate discharge after treatment.      Clinical Course as of 11/21/20 0631  Wed Nov 21, 2020  0255 The patient feels much better and is ready to go.  Her headache has completely resolved.  Her husband is with her.  I gave her my usual and customary return precautions and follow-up recommendations and she agrees with the plan. [CF]    Clinical Course User Index [CF] Hinda Kehr, MD     ____________________________________________  FINAL CLINICAL IMPRESSION(S) / ED DIAGNOSES  Final diagnoses:  Migraine without status migrainosus, not intractable, unspecified migraine type     MEDICATIONS GIVEN DURING THIS VISIT:  Medications  sodium chloride 0.9 % bolus 500 mL (0 mLs Intravenous Stopped 11/21/20 0303)  metoCLOPramide (REGLAN) injection 10 mg (10 mg Intravenous Given 11/21/20 0143)  diphenhydrAMINE (BENADRYL) injection 12.5 mg (12.5 mg Intravenous Given 11/21/20 0142)  dexamethasone (DECADRON) injection 10 mg (10 mg Intravenous Given 11/21/20 0143)  ketorolac (TORADOL) 30 MG/ML injection 15 mg (15 mg Intravenous Given 11/21/20 0141)     ED Discharge Orders     None        Note:  This document was prepared using Dragon voice recognition software  and may include unintentional dictation errors.   Hinda Kehr, MD 11/21/20 [phone]

## 2020-11-22 ENCOUNTER — Encounter: Payer: Self-pay | Admitting: Internal Medicine

## 2020-11-22 NOTE — Telephone Encounter (Signed)
Please call and see how she is doing now.  To my knowledge, she does not have a history of migraine headaches.  If she is still having busting headache and light sensitivity, she needs to be reevaluated.  Let her know that I am out of the office this pm and will be out intermittently over the next 1-2 weeks.  I can work her in on 12/06/20 at 12:00 (she does not need to wait until November), but I am concerned if she is having persistent headache - needs to be reevaluated today.

## 2020-11-23 ENCOUNTER — Telehealth: Payer: Self-pay | Admitting: Internal Medicine

## 2020-11-23 NOTE — Telephone Encounter (Signed)
Please call Brianna [name] and f/u - see how she is doing today.  Any headache?  How is her blood pressure?

## 2020-11-26 NOTE — Telephone Encounter (Signed)
FYI for you-  Patient has only had 2 head aches and they have went away after laying down for a little bit. She has not checked her blood pressure because she keeps forgetting but she is going to start tonight and check up until her appt. Overall says she is doing ok.

## 2020-11-26 NOTE — Telephone Encounter (Signed)
Pt called back returning your call °

## 2020-11-26 NOTE — Telephone Encounter (Signed)
LM for patient. She has an appt 8/18.

## 2020-11-26 NOTE — Telephone Encounter (Signed)
Ok.  Thanks for the update.  Continue to spot check pressure.  Keep f/u appt.  Let us know if any problems, persistent headache or if blood pressure increases.

## 2020-11-26 NOTE — Telephone Encounter (Signed)
Patient is aware of below. 

## 2020-11-28 NOTE — Telephone Encounter (Signed)
Called patient to discuss her BP reading that she attached. Patient denies having any acute symptoms. She has a work in appt next week for migraines but says that her headaches have not been "as bad or as frequent" as they were. Denies heart racing, chest pain, paliptations, sob, dizziness, etc. She did note that her spouse checked his bp with the same cuff and his was normal but also she has not been sitting down and relaxing prior to checking her bp. Advised to check her bp when she gets home but sit and wait 5-10 minutes after putting cuff on so she is relaxed. If bp or heart rate is elevated at that time- she has agreed to be evaluated. Pt also agreed to go to urgent care if she develops any acute symptoms at all. She will continue to record her pressures and keep appt on 8/18.

## 2020-12-06 ENCOUNTER — Ambulatory Visit: Payer: 59 | Admitting: Internal Medicine

## 2020-12-10 ENCOUNTER — Other Ambulatory Visit: Payer: Self-pay | Admitting: Internal Medicine

## 2020-12-10 DIAGNOSIS — R7301 Impaired fasting glucose: Secondary | ICD-10-CM

## 2020-12-10 DIAGNOSIS — Z6836 Body mass index (BMI) 36.0-36.9, adult: Secondary | ICD-10-CM

## 2020-12-12 ENCOUNTER — Other Ambulatory Visit: Payer: Self-pay | Admitting: Obstetrics and Gynecology

## 2020-12-12 DIAGNOSIS — N939 Abnormal uterine and vaginal bleeding, unspecified: Secondary | ICD-10-CM

## 2020-12-25 ENCOUNTER — Ambulatory Visit: Payer: 59 | Admitting: Pharmacist

## 2020-12-25 VITALS — Wt 202.0 lb

## 2020-12-25 DIAGNOSIS — K2270 Barrett's esophagus without dysplasia: Secondary | ICD-10-CM

## 2020-12-25 DIAGNOSIS — Z6834 Body mass index (BMI) 34.0-34.9, adult: Secondary | ICD-10-CM

## 2020-12-25 DIAGNOSIS — R7301 Impaired fasting glucose: Secondary | ICD-10-CM

## 2020-12-25 MED ORDER — OZEMPIC (1 MG/DOSE) 4 MG/3ML ~~LOC~~ SOPN: 1.0000 mg | PEN_INJECTOR | SUBCUTANEOUS | 1 refills | Status: DC

## 2020-12-25 NOTE — Chronic Care Management (AMB) (Signed)
Care Management   Pharmacy Note  12/25/2020 Name: Brianna [name] MRN: [id number] DOB: 1971/01/06  Subjective: Brianna [name] is a 50 y.o. year old female who is a primary care patient of Einar Pheasant, MD. The Care Management team was consulted for assistance with care management and care coordination needs.    Engaged with patient by telephone for follow up visit in response to provider referral for pharmacy case management and/or care coordination services.   The patient was given information about Care Management services today including:  Care Management services includes personalized support from designated clinical staff supervised by the patient's primary care provider, including individualized plan of care and coordination with other care providers. 24/7 contact phone numbers for assistance for urgent and routine care needs. The patient may stop case management services at any time by phone call to the office staff.  Patient agreed to services and consent obtained.  Assessment:  Review of patient status, including review of consultants reports, laboratory and other test data, was performed as part of comprehensive evaluation and provision of chronic care management services.   SDOH (Social Determinants of Health) assessments and interventions performed:    Objective:  Lab Results  Component Value Date   CREATININE 0.63 11/20/2020   CREATININE 0.73 06/01/2019   CREATININE 0.47 05/25/2018    No results found for: HGBA1C     Component Value Date/Time   CHOL 226 (H) 06/01/2019 1045   TRIG 199.0 (H) 06/01/2019 1045   HDL 59.80 06/01/2019 1045   CHOLHDL 4 06/01/2019 1045   VLDL 39.8 06/01/2019 1045   [place] 126 (H) 06/01/2019 1045    Clinical ASCVD: No  The 10-year ASCVD risk score Mikey Bussing DC Jr., et al., 2013) is: 2%   Values used to calculate the score:     Age: 10 years     Sex: Female     Is Non-Hispanic African American: No     Diabetic: No     Tobacco smoker: No      Systolic Blood Pressure: Q000111Q mmHg     Is BP treated: No     HDL Cholesterol: 59.8 mg/dL     Total Cholesterol: 244 mg/dL      BP Readings from Last 3 Encounters:  11/21/20 (!) 150/95  09/04/20 118/70  08/28/20 132/85    Care Plan  Allergies  Allergen Reactions   Hydrocodone Hives    Medications Reviewed Today     Reviewed by De Hollingshead, RPH-CPP (Pharmacist) on 12/25/20 at 1326  Med List Status: <None>   Medication Order Taking? Sig Documenting Provider Last Dose Status Informant  acetaminophen (TYLENOL) 500 MG tablet [id number] Yes Take 1 tablet (500 mg total) by mouth every 6 (six) hours. Homero Fellers, MD Taking Active   clobetasol ointment (TEMOVATE) 0.05 % [id number] Yes Apply to affected area twice a day for 4 weeks, then every night for 4 weeks and then twice a week for 4 weeks or until resolution.  Patient taking differently: Apply 1 application topically daily as needed (rash).   Homero Fellers, MD Taking Active   estradiol (CLIMARA - DOSED IN MG/24 HR) 0.1 mg/24hr patch [id number] Yes Place 1 patch (0.1 mg total) onto the skin once a week. Homero Fellers, MD Taking Active   ibuprofen (ADVIL) 600 MG tablet [id number]  TAKE ONE TABLET BY MOUTH EVERY 6 HOURS AS NEEDED Homero Fellers, MD  Active     Discontinued [id number] (Duplicate) medroxyPROGESTERone (PROVERA) 10 MG tablet [id number]  Yes TAKE ONE TABLET BY MOUTH DAILY Schuman, Christanna R, MD Taking Active   omeprazole (PRILOSEC) 20 MG capsule [id number] No Take 1 capsule (20 mg total) by mouth 2 (two) times daily before a meal. Take 1 capsule (20 mg total) two times a day for 30 days and then take 1 capsule a day for two months.  Patient not taking: Reported on 12/25/2020   Virgel Manifold, MD Not Taking Active            Med Note ([place], [place] Aug 21, 2020 10:28 AM)    ondansetron (ZOFRAN) 4 MG tablet [id number] No Take 1 tablet (4 mg total) by mouth every 8 (eight)  hours as needed for nausea or vomiting.  Patient not taking: No sig reported   Rod Can, CNM Not Taking Active     Discontinued 12/25/20 1302 (Completed Course)   OVER THE COUNTER MEDICATION [id number] Yes Take 135 mg by mouth at bedtime. MaryRuth's liposomal magnesium '135mg'$ /28m [provider] Taking Active Self  OVER THE COUNTER MEDICATION [id number] Take 18 mg by mouth at bedtime. MaryRuth's liquid iron '18mg'$ /142mProvider, Historical, MD Taking Active Self  Discontinued 12/25/20 1313 (Change in therapy)   PARoxetine (PAXIL) 10 MG tablet [id number] Take 1 tablet (10 mg total) by mouth daily. ScHomero FellersMD Taking Active Self  Polyethyl Glycol-Propyl Glycol (SYSTANE ULTRA OP) [id number] Place 1 drop into both eyes every 6 (six) hours as needed (dry eyes). [provider] Taking Active Self  progesterone (PROMETRIUM) 100 MG capsule [id number] Take 1 capsule (100 mg total) by mouth daily.  Patient not taking: Reported on 12/25/2020   ScHomero FellersMD Not Taking Active   Semaglutide, 1 MG/DOSE, (OZEMPIC, 1 MG/DOSE,) 4 MG/3ML SOPN [id number] Inject 1 mg into the skin once a week. ScEinar PheasantMD  Active             Patient Active Problem List   Diagnosis Date Noted   Abnormal uterine bleeding    Postmenopausal bleeding    Hiatal hernia 06/20/2020   Stress 02/19/2020   Dysphagia    Barrett's esophagus without dysplasia    Stomach irritation    Weight gain 07/17/2019   Varicose veins of leg with pain, bilateral 07/17/2019   GERD (gastroesophageal reflux disease) 06/05/2019   Menstrual changes 06/05/2019   Encounter for screening colonoscopy 06/05/2019   Sweating increase 06/05/2019   Leiomyoma 06/29/2018   Iron deficiency anemia 06/17/2018   Restless leg 05/26/2018    Conditions to be addressed/monitored:  Obesity  Care Plan : Medication Management  Updates made by TrDe HollingsheadRPH-CPP since 12/25/2020 12:00 AM      Problem: Weight Management, Anxiety, Menopause      Long-Range Goal: Disease Progression Prevention   This Visit's Progress: On track  Recent Progress: On track  Priority: High  Note:   Current Barriers:  Unable to independently achieve control of weight   Pharmacist Clinical Goal(s):  Over the next 90 days, patient will maintain control of weight as evidenced through collaboration with PharmD and provider.   Interventions: 1:1 collaboration with ScEinar PheasantMD regarding development and update of comprehensive plan of care as evidenced by provider attestation and co-signature Inter-disciplinary care team collaboration (see longitudinal plan of care) Comprehensive medication review performed; medication list updated in electronic medical record  Health Maintenance Yearly influenza vaccination: due Td/Tdap vaccination: due - will discuss moving forward COVID vaccinations: due - follow  for availability of updated formulation of boosters Shingrix vaccinations: due - will discuss moving forward Colonoscopy: due - will discuss moving forward Mammogram: due - ordered Missed last appointment with Dr. [name]. Reports she forgot. Assisted in scheduling f/u appointment in provider's next appointment.   Obesity, complicated by medical conditions including GERD, family hx ASCVD, HLD: Unable to obtain a goal weight loss of 5-10% with diet and physical activity; current treatment: Ozempic 0.5 mg weekly Denies GI upset including nausea, vomiting, diarrhea, constipation Baseline weight: 212 lbs; Most recent weight: 202 lbs (5% weight loss from baseline) Current meal patterns: lean proteins, low carbohydrate meals overall Exercise: targets 10,000 steps daily at work. Active job.  Praised for improvement in weight with Ozempic. Discussed potential to increase to 1 mg weekly. Patient amenable. Increase Ozempic to 1 mg weekly. Encouraged to reach out if stocking issues at her pharmacy. Advised  on potential for increased risk of GI side effects with dose increase.   Migraines: Reports that since the ED visit for migraine, she has only had 1 episode. Reports last week she was driving, had a few seconds of dizziness and nausea in pulses. She pulled over and the sensations passed within a few seconds. No other episodes since then. She has not checked her blood pressure either. Scheduled f/u with PCP in next available, but will collaborate w/ PCP to see if any other work up appropriate at this time. Concern if this is considered an aura episode, as patient is on concurrent estrogen therapy   Anxiety: Controlled per patient report; current treatment: paroxetine 10 mg daily;  Previously recommended to continue current regmen at this time  Menopausal Symptoms with Recent hysteroscopy Improved per patient report; estradiol 1 mg weekly patch, medroxyprogesterone 10 mg daily Hx lack of benefit with progesterone Encouraged continued adherence and follow up with gynecology.   Barrett's Esophagus, GERD Controlled per patient report; current regimen: omeprazole 20 mg BID - reports she has been out of medication due to not having refills. Denies GERD symptoms.  Reviewed recommendation of continued PPI use in Barrett's esophagus. Patient amenable to restarting omeprazole 20 mg daily. Will collaborate w/ PCP to see if she is willing to refill omeprazole 20 mg daily. Continue current regimen at this time along with avoidance of trigger foods.  Supplements: Iron liquid supplement, magnesium liquid supplement  Patient Goals/Self-Care Activities Over the next 90 days, patient will:  - take medications as prescribed target a minimum of 150 minutes of moderate intensity exercise weekly engage in dietary modifications by continuing to moderate portion sizes  Follow Up Plan: Telephone follow up appointment with care management team member scheduled for: ~ 8 weeks      Medication Assistance:  None  required.  Patient affirms current coverage meets needs.  Follow Up:  Patient agrees to Care Plan and Follow-up.  Plan: Telephone follow up appointment with care management team member scheduled for:  ~ 6 weeks  Catie Darnelle Maffucci, PharmD, [place], [place] Clinical Pharmacist [employer] [facility] [employer] [phone]

## 2020-12-25 NOTE — Patient Instructions (Signed)
Visit Information   Goals Addressed               This Visit's Progress     Patient Stated     Medication Management (pt-stated)        Patient Goals/Self-Care Activities Over the next 90 days, patient will:  - take medications as prescribed target a minimum of 150 minutes of moderate intensity exercise weekly engage in dietary modifications by continuing to moderate portion sizes        Patient verbalizes understanding of instructions provided today and agrees to view in [place].   Plan: Telephone follow up appointment with care management team member scheduled for:  ~ 6 weeks  Catie Darnelle Maffucci, PharmD, [place], [place] Clinical Pharmacist [employer] [facility] [employer] [phone]

## 2021-02-05 ENCOUNTER — Ambulatory Visit: Payer: 59 | Admitting: Pharmacist

## 2021-02-05 VITALS — Wt 196.0 lb

## 2021-02-05 DIAGNOSIS — Z6833 Body mass index (BMI) 33.0-33.9, adult: Secondary | ICD-10-CM

## 2021-02-05 DIAGNOSIS — R7301 Impaired fasting glucose: Secondary | ICD-10-CM

## 2021-02-05 DIAGNOSIS — K2270 Barrett's esophagus without dysplasia: Secondary | ICD-10-CM

## 2021-02-05 NOTE — Patient Instructions (Signed)
Visit Information   Goals Addressed               This Visit's Progress     Patient Stated     Medication Management (pt-stated)        Patient Goals/Self-Care Activities Over the next 90 days, patient will:  - take medications as prescribed target a minimum of 150 minutes of moderate intensity exercise weekly engage in dietary modifications by continuing to moderate portion sizes         Patient verbalizes understanding of instructions provided today and agrees to view in [place].   Plan: Telephone follow up appointment with care management team member scheduled for:  12 weeks  Catie Darnelle Maffucci, PharmD, [place], [place] Clinical Pharmacist [employer] [facility] [employer] [phone]

## 2021-02-05 NOTE — Chronic Care Management (AMB) (Signed)
Care Management   Pharmacy Note  02/05/2021 Name: Brianna [name] MRN: [id number] DOB: December 25, 1970  Subjective: Brianna [name] is a 50 y.o. year old female who is a primary care patient of Einar Pheasant, MD. The Care Management team was consulted for assistance with care management and care coordination needs.    Engaged with patient by telephone for follow up visit in response to provider referral for pharmacy case management and/or care coordination services.   The patient was given information about Care Management services today including:  Care Management services includes personalized support from designated clinical staff supervised by the patient's primary care provider, including individualized plan of care and coordination with other care providers. 24/7 contact phone numbers for assistance for urgent and routine care needs. The patient may stop case management services at any time by phone call to the office staff.  Patient agreed to services and consent obtained.  Assessment:  Review of patient status, including review of consultants reports, laboratory and other test data, was performed as part of comprehensive evaluation and provision of chronic care management services.   SDOH (Social Determinants of Health) assessments and interventions performed:  SDOH Interventions    Flowsheet Row Most Recent Value  SDOH Interventions   Financial Strain Interventions Intervention Not Indicated        Objective:  Lab Results  Component Value Date   CREATININE 0.63 11/20/2020   CREATININE 0.73 06/01/2019   CREATININE 0.47 05/25/2018    No results found for: HGBA1C     Component Value Date/Time   CHOL 226 (H) 06/01/2019 1045   TRIG 199.0 (H) 06/01/2019 1045   HDL 59.80 06/01/2019 1045   CHOLHDL 4 06/01/2019 1045   VLDL 39.8 06/01/2019 1045   LDLCALC 126 (H) 06/01/2019 1045     BP Readings from Last 3 Encounters:  11/21/20 (!) 150/95  09/04/20 118/70  08/28/20 132/85     Care Plan  Allergies  Allergen Reactions   Hydrocodone Hives    Medications Reviewed Today     Reviewed by De Hollingshead, RPH-CPP (Pharmacist) on 02/05/21 at 47  Med List Status: <None>   Medication Order Taking? Sig Documenting Provider Last Dose Status Informant  acetaminophen (TYLENOL) 500 MG tablet [id number]  Take 1 tablet (500 mg total) by mouth every 6 (six) hours. Homero Fellers, MD  Active   clobetasol ointment (TEMOVATE) 0.05 % [id number]  Apply to affected area twice a day for 4 weeks, then every night for 4 weeks and then twice a week for 4 weeks or until resolution.  Patient taking differently: Apply 1 application topically daily as needed (rash).   Homero Fellers, MD  Active   estradiol (CLIMARA - DOSED IN MG/24 HR) 0.1 mg/24hr patch [id number]  Place 1 patch (0.1 mg total) onto the skin once a week. Homero Fellers, MD  Active   ibuprofen (ADVIL) 600 MG tablet [id number]  TAKE ONE TABLET BY MOUTH EVERY 6 HOURS AS NEEDED Schuman, Christanna R, MD  Active   medroxyPROGESTERone (PROVERA) 10 MG tablet [id number]  TAKE ONE TABLET BY MOUTH DAILY Schuman, Christanna R, MD  Active   omeprazole (PRILOSEC) 20 MG capsule [id number]  Take 1 capsule (20 mg total) by mouth 2 (two) times daily before a meal. Take 1 capsule (20 mg total) two times a day for 30 days and then take 1 capsule a day for two months.  Patient not taking: Reported on 12/25/2020   Virgel Manifold, MD  Active  Med Note ([place] Aug 21, 2020 10:28 AM)    ondansetron (ZOFRAN) 4 MG tablet [id number]  Take 1 tablet (4 mg total) by mouth every 8 (eight) hours as needed for nausea or vomiting.  Patient not taking: No sig reported   Rod Can, CNM  Active   OVER THE COUNTER MEDICATION [id number]  Take 135 mg by mouth at bedtime. MaryRuth's liposomal magnesium 135mg /9ml [provider]  Active Self  OVER THE COUNTER MEDICATION [id number]  Take 18 mg by  mouth at bedtime. MaryRuth's liquid iron 18mg /46ml [provider]  Active Self  PARoxetine (PAXIL) 10 MG tablet [id number]  Take 1 tablet (10 mg total) by mouth daily. Homero Fellers, MD  Active Self  Polyethyl Glycol-Propyl Glycol (SYSTANE ULTRA OP) [id number]  Place 1 drop into both eyes every 6 (six) hours as needed (dry eyes). [provider]  Active Self  progesterone (PROMETRIUM) 100 MG capsule [id number]  Take 1 capsule (100 mg total) by mouth daily.  Patient not taking: Reported on 12/25/2020   Homero Fellers, MD  Active   Semaglutide, 1 MG/DOSE, (OZEMPIC, 1 MG/DOSE,) 4 MG/3ML SOPN [id number] Yes Inject 1 mg into the skin once a week. Einar Pheasant, MD Taking Active             Patient Active Problem List   Diagnosis Date Noted   Abnormal uterine bleeding    Postmenopausal bleeding    Hiatal hernia 06/20/2020   Stress 02/19/2020   Dysphagia    Barrett's esophagus without dysplasia    Stomach irritation    Weight gain 07/17/2019   Varicose veins of leg with pain, bilateral 07/17/2019   GERD (gastroesophageal reflux disease) 06/05/2019   Menstrual changes 06/05/2019   Encounter for screening colonoscopy 06/05/2019   Sweating increase 06/05/2019   Leiomyoma 06/29/2018   Iron deficiency anemia 06/17/2018   Restless leg 05/26/2018    Conditions to be addressed/monitored:  Overweigh  Care Plan : Medication Management  Updates made by De Hollingshead, RPH-CPP since 02/05/2021 12:00 AM     Problem: Weight Management, Anxiety, Menopause      Long-Range Goal: Disease Progression Prevention   Recent Progress: On track  Priority: High  Note:   Current Barriers:  Unable to independently achieve control of weight   Pharmacist Clinical Goal(s):  Over the next 90 days, patient will maintain control of weight as evidenced through collaboration with PharmD and provider.   Interventions: 1:1 collaboration with Einar Pheasant, MD regarding  development and update of comprehensive plan of care as evidenced by provider attestation and co-signature Inter-disciplinary care team collaboration (see longitudinal plan of care) Comprehensive medication review performed; medication list updated in electronic medical record  Health Maintenance Yearly influenza vaccination: due Td/Tdap vaccination: due - will discuss moving forward COVID vaccinations: due - follow for availability of updated formulation of boosters Shingrix vaccinations: due - will discuss moving forward Colonoscopy: due - will discuss moving forward Mammogram: due - ordered  Obesity, complicated by medical conditions including GERD, family hx ASCVD, HLD: Unable to obtain a goal weight loss of 5-10% with diet and physical activity; current treatment: Ozempic 1 mg weekly Does report significant constipation, has been taking probiotics with little benefit  Baseline weight: 212 lbs; Most recent weight: 196 lbs (7% weight loss from baseline) Current meal patterns: lean proteins, low carbohydrate meals.  Exercise: has 2 puppies, walks them for about an hour a day Praised for improvement in weight with Ozempic.  Discussed constipation. Encouraged hydration, fiber intake, and physical activity. Discussed use of miralax or senakot-s PRN to help regulate. Patient amenable.  Migraines: Denies any recurrent episodes of dizziness/migraines since our last call.   Anxiety: Controlled per patient report; current treatment: paroxetine 10 mg daily;  Previously recommended to continue current regmen at this time  Menopausal Symptoms with Recent hysteroscopy Improved per patient report; estradiol 1 mg weekly patch, medroxyprogesterone 10 mg daily Hx lack of benefit with progesterone Previously encouraged continued adherence and follow up with gynecology.   Barrett's Esophagus, GERD Controlled per patient report; current regimen: omeprazole 20 mg daily Previously recommended to  continue current regimen at this time along with avoidance of trigger foods.  Supplements: Iron liquid supplement, magnesium liquid supplement  Patient Goals/Self-Care Activities Over the next 90 days, patient will:  - take medications as prescribed target a minimum of 150 minutes of moderate intensity exercise weekly engage in dietary modifications by continuing to moderate portion sizes  Follow Up Plan: Telephone follow up appointment with care management team member scheduled for: ~ 12 weeks      Medication Assistance:  None required.  Patient affirms current coverage meets needs.  Follow Up:  Patient agrees to Care Plan and Follow-up.  Plan: Telephone follow up appointment with care management team member scheduled for:  12 weeks  Catie Darnelle Maffucci, PharmD, [place], [place] Clinical Pharmacist [employer] at [employer] [phone]

## 2021-02-11 ENCOUNTER — Ambulatory Visit: Payer: 59 | Admitting: Obstetrics and Gynecology

## 2021-02-14 ENCOUNTER — Other Ambulatory Visit: Payer: Self-pay | Admitting: Obstetrics and Gynecology

## 2021-02-14 DIAGNOSIS — N939 Abnormal uterine and vaginal bleeding, unspecified: Secondary | ICD-10-CM

## 2021-02-15 ENCOUNTER — Other Ambulatory Visit: Payer: Self-pay | Admitting: Internal Medicine

## 2021-02-15 ENCOUNTER — Ambulatory Visit: Payer: 59 | Admitting: Obstetrics and Gynecology

## 2021-02-15 DIAGNOSIS — Z6834 Body mass index (BMI) 34.0-34.9, adult: Secondary | ICD-10-CM

## 2021-02-19 ENCOUNTER — Ambulatory Visit: Payer: 59 | Admitting: Obstetrics and Gynecology

## 2021-02-19 ENCOUNTER — Ambulatory Visit: Payer: 59 | Admitting: Internal Medicine

## 2021-02-25 ENCOUNTER — Other Ambulatory Visit: Payer: Self-pay

## 2021-02-25 ENCOUNTER — Encounter: Payer: Self-pay | Admitting: Obstetrics and Gynecology

## 2021-02-25 ENCOUNTER — Ambulatory Visit (INDEPENDENT_AMBULATORY_CARE_PROVIDER_SITE_OTHER): Payer: 59 | Admitting: Obstetrics and Gynecology

## 2021-02-25 VITALS — BP 130/80 | Ht 64.0 in | Wt 207.4 lb

## 2021-02-25 DIAGNOSIS — R232 Flushing: Secondary | ICD-10-CM | POA: Diagnosis not present

## 2021-02-25 DIAGNOSIS — F32A Depression, unspecified: Secondary | ICD-10-CM

## 2021-02-25 DIAGNOSIS — F419 Anxiety disorder, unspecified: Secondary | ICD-10-CM

## 2021-02-25 MED ORDER — ESTRADIOL-NORETHINDRONE ACET 1-0.5 MG PO TABS: 1.0000 | ORAL_TABLET | Freq: Every day | ORAL | 12 refills | Status: DC

## 2021-02-25 MED ORDER — BUSPIRONE HCL 10 MG PO TABS: 10.0000 mg | ORAL_TABLET | Freq: Three times a day (TID) | ORAL | 5 refills | Status: DC

## 2021-02-25 NOTE — Progress Notes (Signed)
Patient ID: Brianna [name], female   DOB: 01/05/1971, 50 y.o.   MRN: [id number]  Reason for Consult: No chief complaint on file.   Referred by Einar Pheasant, MD  Subjective:     HPI:  Brianna [name] is a 50 y.o. female. She has been taking HRT for vasomotor symptoms of menopause without significant improvement of relief. She reports she continues to have daily symptoms. She is also taking Paxil. She reports today that she still have daily vaginal bleeding. She had a prior D&C with normal pathology earlier this year. In additional to her estrogen patch she has been on Provera 10mg  daily.  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    Gynecological History  No LMP recorded. Patient is perimenopausal.  Past Medical History:  Diagnosis Date   Anemia    Chicken pox    Depression    GERD (gastroesophageal reflux disease)    Iron deficiency anemia 06/17/2018   Jaundice due to hepatitis    Restless leg syndrome    Family History  Problem Relation Age of Onset   Early death Mother    Heart disease Mother    Hyperlipidemia Mother    Hypertension Mother    Alcohol abuse Father    Arthritis Father    COPD Father    Hyperlipidemia Father    Hypertension Father    Kidney disease Father    Arthritis Maternal Grandmother    Stroke Maternal Grandmother    Hyperlipidemia Maternal Grandfather    Breast cancer Paternal Aunt 44   Past Surgical History:  Procedure Laterality Date   CESAREAN SECTION  [phone]   CHOLECYSTECTOMY  2003   COLONOSCOPY WITH PROPOFOL N/A 08/22/2019   Procedure: COLONOSCOPY WITH PROPOFOL;  Surgeon: Virgel Manifold, MD;  Location: [facility] END[facility];  Service: Endoscopy;  Laterality: N/A;   ESOPHAGOGASTRODUODENOSCOPY (EGD) WITH PROPOFOL N/A 08/22/2019   Procedure: ESOPHAGOGASTRODUODENOSCOPY (EGD) WITH PROPOFOL;  Surgeon: Virgel Manifold, MD;  Location: [facility] ENDOSCOPY;  Service: Endoscopy;   Laterality: N/A;   HERNIA REPAIR     HYSTEROSCOPY WITH D & C N/A 08/28/2020   Procedure: DILATATION AND CURETTAGE /HYSTEROSCOPY;  Surgeon: Homero Fellers, MD;  Location: [facility] ORS;  Service: Gynecology;  Laterality: N/A;    Short Social History:  Social History   Tobacco Use   Smoking status: Former   Smokeless tobacco: Never  Substance Use Topics   Alcohol use: Yes    Alcohol/week: 7.0 standard drinks    Types: 7 Glasses of wine per week    Comment: occasional     Allergies  Allergen Reactions   Hydrocodone Hives    Current Outpatient Medications  Medication Sig Dispense Refill   acetaminophen (TYLENOL) 500 MG tablet Take 1 tablet (500 mg total) by mouth every 6 (six) hours. 60 tablet 0   busPIRone (BUSPAR) 10 MG tablet Take 1 tablet (10 mg total) by mouth 3 (three) times daily. 90 tablet 5   clobetasol ointment (TEMOVATE) 0.05 % Apply to affected area twice a day for 4 weeks, then every night for 4 weeks and then twice a week for 4 weeks or until resolution. (Patient taking differently: Apply 1 application topically daily as needed (rash).) 30 g 5   estradiol-norethindrone (ACTIVELLA) 1-0.5 MG tablet Take 1 tablet by mouth daily. 30 tablet 12   ibuprofen (ADVIL) 600 MG tablet TAKE ONE TABLET BY MOUTH EVERY 6 HOURS AS NEEDED 60 tablet 0  OZEMPIC, 1 MG/DOSE, 4 MG/3ML SOPN DIAL AND INJECT UNDER THE SKIN 1 MG WEEKLY 3 mL 1   PARoxetine (PAXIL) 10 MG tablet Take 1 tablet (10 mg total) by mouth daily. 90 tablet 3   Polyethyl Glycol-Propyl Glycol (SYSTANE ULTRA OP) Place 1 drop into both eyes every 6 (six) hours as needed (dry eyes).     omeprazole (PRILOSEC) 20 MG capsule Take 1 capsule (20 mg total) by mouth 2 (two) times daily before a meal. Take 1 capsule (20 mg total) two times a day for 30 days and then take 1 capsule a day for two months. (Patient not taking: Reported on 12/25/2020) 60 capsule 2   ondansetron (ZOFRAN) 4 MG tablet Take 1 tablet (4 mg total) by mouth every 8  (eight) hours as needed for nausea or vomiting. (Patient not taking: No sig reported) 20 tablet 0   OVER THE COUNTER MEDICATION Take 135 mg by mouth at bedtime. MaryRuth's liposomal magnesium 135mg /50ml     OVER THE COUNTER MEDICATION Take 18 mg by mouth at bedtime. MaryRuth's liquid iron 18mg /82ml     No current facility-administered medications for this visit.    Review of Systems  Constitutional: Negative for chills, fatigue, fever and unexpected weight change.  HENT: Negative for trouble swallowing.  Eyes: Negative for loss of vision.  Respiratory: Negative for cough, shortness of breath and wheezing.  Cardiovascular: Negative for chest pain, leg swelling, palpitations and syncope.  GI: Negative for abdominal pain, blood in stool, diarrhea, nausea and vomiting.  GU: Negative for difficulty urinating, dysuria, frequency and hematuria.  Musculoskeletal: Negative for back pain, leg pain and joint pain.  Skin: Negative for rash.  Neurological: Negative for dizziness, headaches, light-headedness, numbness and seizures.  Psychiatric: Negative for behavioral problem, confusion, depressed mood and sleep disturbance.       Objective:  Objective   Vitals:   02/25/21 1454  BP: 130/80  Weight: 207 lb 6.4 oz (94.1 kg)  Height: 5\' 4"  (1.626 m)   Body mass index is 35.6 kg/m.  Physical Exam  Assessment/Plan:     50 yo with vasomotor symptoms of menopause and postmenopausal bleeding Discussed options for postmenopausal bleeding which thus far has not been responsive to hormone therapy  such as IUD and hysterectomy.  At this time she is not able to consider surgery since there is a lot going on in her personal life.  She has not experienced resolution of vasomotor symptoms with HRT. Will switch from patch to oral pill.  Discontinue Provera and estrogen patch.  Will add buspar for anxiety.Continue Paxil.  Will follow up with patient in 4 weeks regarding symptoms and medical management.    More than 20 minutes were spent face to face with the patient in the room, reviewing the medical record, labs and images, and coordinating care for the patient. The plan of management was discussed in detail and counseling was provided.    Adrian Prows MD [facility], [facility] 02/25/2021 4:14 PM

## 2021-02-25 NOTE — Patient Instructions (Signed)
[place], [place] Same Day Access Hours:  Monday, Wednesday and Friday, 8am - 3pm Walk-In Crisis Hours: 7 days/wk, 8am - 8pm [address], [place], [place] [address] [phone]  [facility] [phone]  Mobile Crisis Unit [phone]   Therapists/Counselors/Psychologists  [facility] [address] [facility] [address] [place], [place] [address] [phone]  Karen [place], [place]    [phone]        [place], [place] [address]        Peggye Ley, [place] [phone] [place], [place] [address]  Lady Deutscher, [place]        [phone]        [address]      [place], [place] [address]        Bettey Mare, [place] [phone] [address] [place], [place] [address]   Dimas Aguas, LMFT       [phone]        [address], [place] [address]        Miguel Dibble [phone] [address] [place], [place] [address]  Rhona Raider        [phone]        [address]       [place], [place] [address]        Sena Hitch [phone] [address] [place], [place] [address]  Lestine Box, [place]       [phone]        [address]       [place], [place] [address]        Marjie Skiff, [place] [phone] [address]  [place], [place] [address]   Einar Gip        [phone]        [address] [place], [place] [address]         [place] [phone] [email or web address]   Sation Konchella       [phone]        [address], [place] [address]        [place] [phone] [email or web address]   ADD/ADHD Testing:   Haig Prophet, PhD [address] [place], [place] [address] [phone] [facility] in [place],  [place], [place], [place] [address] Phone: [phone] Fax: [phone] [place] , [place], Medicaid   [employer] [place],  [phone]    [place] [facility] in [place], [place]   ----------------------------------------------------------   Neuropsych Testing Dr Maryellen Pile [address] [place], [place]: [phone] F: [phone] [email or web address]    [facility]  [place] Therapist: [phone]     Eating Disorders   [facility]  [email or web address] [phone]  [facility] for Psychotherapy and [place] [phone], [phone]   Substance Abuse Treatment   [facility] [place], [place], [place], [place], [place], [place], [place], [place], [place], [place] Program:  [email or web address]   Managing Anxiety, Adult After being diagnosed with anxiety, you may be  relieved to know why you have felt or behaved a certain way. You may also feel overwhelmed about the treatment ahead and what it will mean for your life. With care and support, you can manage this condition. How to manage lifestyle changes Managing stress and anxiety Stress is your body's reaction to life changes and events, both good and bad. Most stress will last just a few hours, but stress can be ongoing and can lead to more than just stress. Although stress can play a major role in anxiety, it is not the same as anxiety. Stress is usually caused by something external, such as a deadline, test, or competition. Stress normally passes after the triggering event has ended.  Anxiety is caused by something internal, such as imagining a terrible  outcome or worrying that something will go wrong that will devastate you. Anxiety often does not go away even after the triggering event is over, and it can become long-term (chronic) worry. It is important to understand the differences between stress and anxiety and to manage your stress effectively so that it does not lead to an anxious response. Talk with your health care provider or a counselor to learn more about reducing anxiety and stress. He or she may suggest tension reduction techniques, such as: Music therapy. Spend time creating or listening to music that you enjoy and that inspires you. Mindfulness-based meditation. Practice being aware of your normal breaths while not trying to control your breathing. It can be done while sitting or walking. Centering prayer. This involves focusing on a word, phrase, or sacred image that means something to you and brings you peace. Deep breathing. To do this, expand your stomach and inhale slowly through your nose. Hold your breath for 3-5 seconds. Then exhale slowly, letting your stomach muscles relax. Self-talk. Learn to notice and identify thought patterns that lead to anxiety reactions and change those patterns to thoughts that feel peaceful. Muscle relaxation. Taking time to tense muscles and then relax them. Choose a tension reduction technique that fits your lifestyle and personality. These techniques take time and practice. Set aside 5-15 minutes a day to do them. Therapists can offer counseling and training in these techniques. The training to help with anxiety may be covered by some insurance plans. Other things you can do to manage stress and anxiety include: Keeping a stress diary. This can help you learn what triggers your reaction and then learn ways to manage your response. Thinking about how you react to certain situations. You may not be able to control everything, but you can control your response. Making time for activities that help you  relax and not feeling guilty about spending your time in this way. Doing visual imagery. This involves imagining or creating mental pictures to help you relax. Practicing yoga. Through yoga poses, you can lower tension and promote relaxation.  Medicines Medicines can help ease symptoms. Medicines for anxiety include: Antidepressant medicines. These are usually prescribed for long-term daily control. Anti-anxiety medicines. These may be added in severe cases, especially when panic attacks occur. Medicines will be prescribed by a health care provider. When used together, medicines, psychotherapy, and tension reduction techniques may be the most effective treatment. Relationships Relationships can play a big part in helping you recover. Try to spend more time connecting with trusted friends and family members. Consider going to couples counseling if you have a partner, taking family education classes, or going to family therapy. Therapy can help you and others better understand your  condition. How to recognize changes in your anxiety Everyone responds differently to treatment for anxiety. Recovery from anxiety happens when symptoms decrease and stop interfering with your daily activities at home or work. This may mean that you will start to: Have better concentration and focus. Worry will interfere less in your daily thinking. Sleep better. Be less irritable. Have more energy. Have improved memory. It is also important to recognize when your condition is getting worse. Contact your health care provider if your symptoms interfere with home or work and you feel like your condition is not improving. Follow these instructions at home: Activity Exercise. Adults should do the following: Exercise for at least 150 minutes each week. The exercise should increase your heart rate and make you sweat (moderate-intensity exercise). Strengthening exercises at least twice a week. Get the right amount and  quality of sleep. Most adults need 7-9 hours of sleep each night. Lifestyle  Eat a healthy diet that includes plenty of vegetables, fruits, whole grains, low-fat dairy products, and lean protein. Do not eat a lot of foods that are high in fats, added sugars, or salt (sodium). Make choices that simplify your life. Do not use any products that contain nicotine or tobacco. These products include cigarettes, chewing tobacco, and vaping devices, such as e-cigarettes. If you need help quitting, ask your health care provider. Avoid caffeine, alcohol, and certain over-the-counter cold medicines. These may make you feel worse. Ask your pharmacist which medicines to avoid. General instructions Take over-the-counter and prescription medicines only as told by your health care provider. Keep all follow-up visits. This is important. Where to find support You can get help and support from these sources: Self-help groups. Online and [employer]. A trusted spiritual leader. Couples counseling. Family education classes. Family therapy. Where to find more information You may find that joining a support group helps you deal with your anxiety. The following sources can help you locate counselors or support groups near you: [place]: www.mentalhealthamerica.net Anxiety and Depression Association of [place] (ADAA): https://www.clark.net/ National Alliance on Mental Illness (NAMI): www.nami.org Contact a health care provider if: You have a hard time staying focused or finishing daily tasks. You spend many hours a day feeling worried about everyday life. You become exhausted by worry. You start to have headaches or frequently feel tense. You develop chronic nausea or diarrhea. Get help right away if: You have a racing heart and shortness of breath. You have thoughts of hurting yourself or others. If you ever feel like you may hurt yourself or others, or have thoughts about taking your own  life, get help right away. Go to your nearest emergency department or: Call your local emergency services (911 in the [place] Call a suicide crisis helpline, such as the [place] at 380-566-1417 or 988 in the [place]. This is open 24 hours a day in the [place] Text the Crisis Text Line at 973-826-0202 (in the [place].). Summary Taking steps to learn and use tension reduction techniques can help calm you and help prevent triggering an anxiety reaction. When used together, medicines, psychotherapy, and tension reduction techniques may be the most effective treatment. Family, friends, and partners can play a big part in supporting you. This information is not intended to replace advice given to you by your health care provider. Make sure you discuss any questions you have with your health care provider. Document Revised: 10/31/2020 Document Reviewed: 07/29/2020 Elsevier Patient Education  [place]. Adjustment Disorder, Adult Adjustment disorder  is a group of symptoms that can develop after a stressful life event, such as the loss of a job or a serious physical illness. The symptoms can affect how you feel, think, and act. They may also interfere with your relationships. Adjustment disorder increases your risk of suicide and substance abuse. If adjustment disorder is not managed early, it can make medical conditions that you already have worse. If the stressful life event persists, the disorder may continue and become a persistent form of adjustment disorder. What are the causes? This condition is caused by difficulty recovering from or coping with a stressful life event. What increases the risk? You are more likely to develop this condition if: You have had previous problems coping with life stressors. You are being treated for a long-term (chronic) illness. You are being treated for an illness that cannot be cured (terminal illness). You have a family history of mental  illness. What are the signs or symptoms? Symptoms of this condition include: Behavioral symptoms such as: Trouble doing daily tasks. Reckless driving. Poor work Systems analyst. Ignoring bills. Avoiding family and friends. Impulsive actions. Emotional symptoms such as: Sadness, depression, or crying spells. Worrying a lot, or feeling nervous or anxious. Loss of enjoyment. Feelings of loss or hopelessness. Irritability. Thoughts of suicide. Physical symptoms such as: Change in appetite or weight. Complaining of feeling sick without being ill. Feeling dazed or disconnected. Nightmares. Trouble sleeping. Symptoms of this condition start within 3 months of the stressful event. They do not last more than 6 months, unless the stressful circumstances last longer. Normal grieving after the death of a loved one is not a symptom of this condition. How is this diagnosed? To diagnose this condition, your health care provider will ask about what has happened in your life and how it has affected you. He or she may also ask about your medical history and your use of medicines, alcohol, and other substances. Your health care provider may do a physical exam and order lab tests or other studies. You may be referred to a mental health specialist. How is this treated? Treatment options for this condition include: Counseling or talk therapy. Talk therapy is usually provided by mental health specialists. This therapy may be individual or may involve family members. Medicines. Certain medicines may help with depression, anxiety, and sleep. Support groups. These offer emotional support, advice, and guidance. They are made up of people who have had similar experiences. Observation and time. This is sometimes called watchful waiting. In this treatment, health care providers monitor your health and behavior without other treatment. Adjustment disorder sometimes gets better on its own with time. Follow these  instructions at home: Take over-the-counter and prescription medicines only as told by your health care provider. Keep all follow-up visits. This is important. Contact trusted family and friends for support. Let them know what is going on with you and how they can help. Contact a health care provider if: Your symptoms do not improve in 6 months. Your symptoms get worse. Get help right away if: You have serious thoughts about hurting yourself or someone else. If you ever feel like you may hurt yourself or others, or have thoughts about taking your own life, get help right away. Go to your nearest emergency department or: Call your local emergency services (911 in the [place] Call a suicide crisis helpline, such as the [place] at (646)281-4131 or 988 in the [place]. This is open 24 hours a day in the [place]  Text the Crisis Text Line at 226-709-2479 (in the [place].S.) Summary Adjustment disorder is a group of symptoms that can develop after a stressful life event, such as the loss of a job or a serious physical illness. The symptoms can affect how you feel, think, and act. They may interfere with your relationships. Symptoms of this condition start within 3 months of the stressful event. They do not last more than 6 months, unless the stressful circumstances last longer. Treatment may include talk therapy, medicines, participation in a support group, or observation to see if symptoms improve. Contact your health care provider if your symptoms get worse or do not improve in 6 months. If you ever feel like you may hurt yourself or others, or have thoughts about taking your own life, get help right away. This information is not intended to replace advice given to you by your health care provider. Make sure you discuss any questions you have with your health care provider. Document Revised: 10/31/2020 Document Reviewed: 08/19/2019 Elsevier Patient Education  [place].

## 2021-02-28 ENCOUNTER — Ambulatory Visit: Payer: 59 | Admitting: Obstetrics and Gynecology

## 2021-03-02 ENCOUNTER — Other Ambulatory Visit: Payer: Self-pay | Admitting: Internal Medicine

## 2021-03-08 ENCOUNTER — Other Ambulatory Visit: Payer: Self-pay

## 2021-03-08 MED ORDER — COVID-19 HOME COLLECTION TEST VI KIT: PACK | 0 refills | Status: DC

## 2021-03-08 NOTE — Progress Notes (Signed)
COVID test sent in per significant others request.

## 2021-03-19 ENCOUNTER — Ambulatory Visit (INDEPENDENT_AMBULATORY_CARE_PROVIDER_SITE_OTHER): Payer: 59 | Admitting: Internal Medicine

## 2021-03-19 ENCOUNTER — Other Ambulatory Visit: Payer: Self-pay

## 2021-03-19 DIAGNOSIS — R519 Headache, unspecified: Secondary | ICD-10-CM | POA: Insufficient documentation

## 2021-03-19 DIAGNOSIS — Z23 Encounter for immunization: Secondary | ICD-10-CM

## 2021-03-19 DIAGNOSIS — F439 Reaction to severe stress, unspecified: Secondary | ICD-10-CM | POA: Diagnosis not present

## 2021-03-19 DIAGNOSIS — Z13220 Encounter for screening for lipoid disorders: Secondary | ICD-10-CM | POA: Diagnosis not present

## 2021-03-19 DIAGNOSIS — D509 Iron deficiency anemia, unspecified: Secondary | ICD-10-CM

## 2021-03-19 DIAGNOSIS — N939 Abnormal uterine and vaginal bleeding, unspecified: Secondary | ICD-10-CM

## 2021-03-19 DIAGNOSIS — K219 Gastro-esophageal reflux disease without esophagitis: Secondary | ICD-10-CM | POA: Diagnosis not present

## 2021-03-19 DIAGNOSIS — Z1231 Encounter for screening mammogram for malignant neoplasm of breast: Secondary | ICD-10-CM

## 2021-03-19 DIAGNOSIS — K2270 Barrett's esophagus without dysplasia: Secondary | ICD-10-CM

## 2021-03-19 DIAGNOSIS — D72829 Elevated white blood cell count, unspecified: Secondary | ICD-10-CM | POA: Insufficient documentation

## 2021-03-19 DIAGNOSIS — Z114 Encounter for screening for human immunodeficiency virus [HIV]: Secondary | ICD-10-CM

## 2021-03-19 DIAGNOSIS — I10 Essential (primary) hypertension: Secondary | ICD-10-CM

## 2021-03-19 DIAGNOSIS — Z1159 Encounter for screening for other viral diseases: Secondary | ICD-10-CM

## 2021-03-19 LAB — CBC WITH DIFFERENTIAL/PLATELET
Basophils Absolute: 0.1 10*3/uL (ref 0.0–0.1)
Basophils Relative: 1.2 % (ref 0.0–3.0)
Eosinophils Absolute: 0.1 10*3/uL (ref 0.0–0.7)
Eosinophils Relative: 2.4 % (ref 0.0–5.0)
HCT: 40.8 % (ref 36.0–46.0)
Hemoglobin: 13.2 g/dL (ref 12.0–15.0)
Lymphocytes Relative: 25 % (ref 12.0–46.0)
Lymphs Abs: 1.4 10*3/uL (ref 0.7–4.0)
MCHC: 32.4 g/dL (ref 30.0–36.0)
MCV: 90.5 fl (ref 78.0–100.0)
Monocytes Absolute: 0.3 10*3/uL (ref 0.1–1.0)
Monocytes Relative: 5.6 % (ref 3.0–12.0)
Neutro Abs: 3.8 10*3/uL (ref 1.4–7.7)
Neutrophils Relative %: 65.8 % (ref 43.0–77.0)
Platelets: 284 10*3/uL (ref 150.0–400.0)
RBC: 4.51 Mil/uL (ref 3.87–5.11)
RDW: 15.1 % (ref 11.5–15.5)
WBC: 5.8 10*3/uL (ref 4.0–10.5)

## 2021-03-19 LAB — HEPATIC FUNCTION PANEL
ALT: 8 U/L (ref 0–35)
AST: 12 U/L (ref 0–37)
Albumin: 4.3 g/dL (ref 3.5–5.2)
Alkaline Phosphatase: 48 U/L (ref 39–117)
Bilirubin, Direct: 0.1 mg/dL (ref 0.0–0.3)
Total Bilirubin: 0.6 mg/dL (ref 0.2–1.2)
Total Protein: 7 g/dL (ref 6.0–8.3)

## 2021-03-19 LAB — IBC + FERRITIN
Ferritin: 5.4 ng/mL — ABNORMAL LOW (ref 10.0–291.0)
Iron: 49 ug/dL (ref 42–145)
Saturation Ratios: 9.1 % — ABNORMAL LOW (ref 20.0–50.0)
TIBC: 540.4 ug/dL — ABNORMAL HIGH (ref 250.0–450.0)
Transferrin: 386 mg/dL — ABNORMAL HIGH (ref 212.0–360.0)

## 2021-03-19 LAB — LIPID PANEL
Cholesterol: 193 mg/dL (ref 0–200)
HDL: 51.3 mg/dL (ref >39.00)
LDL Cholesterol: 111 mg/dL — ABNORMAL HIGH (ref 0–99)
NonHDL: 141.97
Total CHOL/HDL Ratio: 4
Triglycerides: 156 mg/dL — ABNORMAL HIGH (ref 0.0–149.0)
VLDL: 31.2 mg/dL (ref 0.0–40.0)

## 2021-03-19 LAB — BASIC METABOLIC PANEL
BUN: 6 mg/dL (ref 6–23)
CO2: 27 mEq/L (ref 19–32)
Calcium: 8.9 mg/dL (ref 8.4–10.5)
Chloride: 100 mEq/L (ref 96–112)
Creatinine, Ser: 0.7 mg/dL (ref 0.40–1.20)
GFR: 100.62 mL/min (ref >60.00)
Glucose, Bld: 92 mg/dL (ref 70–99)
Potassium: 4.1 mEq/L (ref 3.5–5.1)
Sodium: 134 mEq/L — ABNORMAL LOW (ref 135–145)

## 2021-03-19 LAB — TSH: TSH: 3.08 u[IU]/mL (ref 0.35–5.50)

## 2021-03-19 MED ORDER — METOPROLOL TARTRATE 25 MG PO TABS: 25.0000 mg | ORAL_TABLET | Freq: Two times a day (BID) | ORAL | 2 refills | Status: DC

## 2021-03-19 MED ORDER — MAGNESIUM OXIDE -MG SUPPLEMENT 400 (240 MG) MG PO TABS: 400.0000 mg | ORAL_TABLET | Freq: Every day | ORAL | 2 refills | Status: DC

## 2021-03-19 NOTE — Progress Notes (Signed)
Subjective:    Patient ID: Brianna [name], female    DOB: April 27, 1970, 50 y.o.   MRN: [id number]  This visit occurred during the SARS-CoV-2 public health emergency.  Safety protocols were in place, including screening questions prior to the visit, additional usage of staff PPE, and extensive cleaning of exam room while observing appropriate contact time as indicated for disinfecting solutions.   Patient here for a scheduled follow up.   Chief Complaint  Patient presents with   Headache   .   HPI Just saw gyn 02/25/21.  Has been taking HRT (estrogen patch and provera) for vasomotor symptoms of menopause without significant improvement.  On paxil as well.  Still having daily vaginal bleeding.  S/p D&C with normal pathology earlier this year.  GYN discussed hysterectomy.  She was switched from patch to oral pill.  Buspar was added to help with increased anxiety.  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  Was seen ER 11/21/20 - headache and high blood pressure.  EKG - "reassuring". Troponins x 2 normal. Received Normal saline bolus, reglan, toradol and decadron.  Headache resolved.  Diagnosed with migraine headache. She reports increased headaches and elevated blood pressure.  Reports "migraines" /headaches- light sensitivity and visual auras - vision "bouncing".  No loss of vision - intermittent. Takes excedrin migraine.  Helps.  Has had eyes checked.  New glasses.  Does not feel rested.  Has not had head scan.  No chest pain or sob reported.  No abdominal pain or bowel change reported.     Past Medical History:  Diagnosis Date   Anemia    Chicken pox    Depression    GERD (gastroesophageal reflux disease)    Iron deficiency anemia 06/17/2018   Jaundice due to hepatitis    Restless leg syndrome    Past Surgical History:  Procedure Laterality Date   CESAREAN SECTION  [phone]   CHOLECYSTECTOMY  2003   COLONOSCOPY WITH PROPOFOL  N/A 08/22/2019   Procedure: COLONOSCOPY WITH PROPOFOL;  Surgeon: Virgel Manifold, MD;  Location: [facility];  Service: Endoscopy;  Laterality: N/A;   ESOPHAGOGASTRODUODENOSCOPY (EGD) WITH PROPOFOL N/A 08/22/2019   Procedure: ESOPHAGOGASTRODUODENOSCOPY (EGD) WITH PROPOFOL;  Surgeon: Virgel Manifold, MD;  Location: [facility];  Service: Endoscopy;  Laterality: N/A;   HERNIA REPAIR     HYSTEROSCOPY WITH D & C N/A 08/28/2020   Procedure: DILATATION AND CURETTAGE /HYSTEROSCOPY;  Surgeon: Homero Fellers, MD;  Location: [facility];  Service: Gynecology;  Laterality: N/A;   Family History  Problem Relation Age of Onset   Early death Mother    Heart disease Mother    Hyperlipidemia Mother    Hypertension Mother    Alcohol abuse Father    Arthritis Father    COPD Father    Hyperlipidemia Father    Hypertension Father    Kidney disease Father    Arthritis Maternal Grandmother    Stroke Maternal Grandmother    Hyperlipidemia Maternal Grandfather    Breast cancer Paternal Aunt 46   Social History   Socioeconomic History   Marital status: Single    Spouse name: Not on file   Number of children: Not on file   Years of education: Not on file   Highest education level: Not on file  Occupational History   Not on file  Tobacco Use   Smoking status: Former   Smokeless tobacco: Never  Vaping Use   Vaping  Use: Never used  Substance and Sexual Activity   Alcohol use: Yes    Alcohol/week: 7.0 standard drinks    Types: 7 Glasses of wine per week    Comment: occasional    Drug use: Never   Sexual activity: Yes    Birth control/protection: Post-menopausal  Other Topics Concern   Not on file  Social History Narrative   Not on file   Social Determinants of Health   Financial Resource Strain: Low Risk    Difficulty of Paying Living Expenses: Not hard at all  Food Insecurity: Not on file  Transportation Needs: Not on file  Physical Activity: Not on file  Stress: Not on  file  Social Connections: Not on file     Review of Systems  Constitutional:  Positive for fatigue. Negative for appetite change and unexpected weight change.  HENT:  Negative for congestion and sinus pressure.   Eyes:  Positive for visual disturbance.       Light sensitivity as outlined.   Respiratory:  Negative for cough, chest tightness and shortness of breath.   Cardiovascular:  Negative for chest pain, palpitations and leg swelling.  Gastrointestinal:  Negative for abdominal pain, diarrhea, nausea and vomiting.  Genitourinary:  Negative for difficulty urinating and dysuria.  Musculoskeletal:  Negative for joint swelling and myalgias.  Skin:  Negative for color change and rash.  Neurological:  Positive for headaches.  Psychiatric/Behavioral:  Negative for agitation.        Increased stress as outlined.        Objective:     BP (!) 138/92   Pulse 90   Temp 97.6 F (36.4 C)   Resp 16   Ht _0  (1.626 m)   Wt 205 lb 3.2 oz (93.1 kg)   SpO2 99%   BMI 35.22 kg/m  Wt Readings from Last 3 Encounters:  03/19/21 205 lb 3.2 oz (93.1 kg)  02/25/21 207 lb 6.4 oz (94.1 kg)  02/05/21 196 lb (88.9 kg)    Physical Exam Vitals reviewed.  Constitutional:      General: She is not in acute distress.    Appearance: Normal appearance.  HENT:     Head: Normocephalic and atraumatic.     Right Ear: External ear normal.     Left Ear: External ear normal.  Eyes:     General: No scleral icterus.       Right eye: No discharge.        Left eye: No discharge.     Conjunctiva/sclera: Conjunctivae normal.  Neck:     Thyroid: No thyromegaly.  Cardiovascular:     Rate and Rhythm: Normal rate and regular rhythm.  Pulmonary:     Effort: No respiratory distress.     Breath sounds: Normal breath sounds. No wheezing.  Abdominal:     General: Bowel sounds are normal.     Palpations: Abdomen is soft.     Tenderness: There is no abdominal tenderness.  Musculoskeletal:        General: No  swelling or tenderness.     Cervical back: Neck supple. No tenderness.  Lymphadenopathy:     Cervical: No cervical adenopathy.  Skin:    Findings: No erythema or rash.  Neurological:     Mental Status: She is alert.  Psychiatric:        Mood and Affect: Mood normal.        Behavior: Behavior normal.     Outpatient Encounter Medications as of 03/19/2021  Medication Sig   acetaminophen (TYLENOL) 500 MG tablet Take 1 tablet (500 mg total) by mouth every 6 (six) hours.   busPIRone (BUSPAR) 10 MG tablet Take 1 tablet (10 mg total) by mouth 3 (three) times daily.   clobetasol ointment (TEMOVATE) 0.05 % Apply to affected area twice a day for 4 weeks, then every night for 4 weeks and then twice a week for 4 weeks or until resolution. (Patient taking differently: Apply 1 application topically daily as needed (rash).)   COVID-19 Home Collection Test KIT Use as directed to test for COVID   estradiol-norethindrone (ACTIVELLA) 1-0.5 MG tablet Take 1 tablet by mouth daily.   Fe Fum-FePoly-Vit C-Vit B3 (INTEGRA) 62.5-62.5-40-3 MG CAPS Take 1 capsule by mouth daily.   ibuprofen (ADVIL) 600 MG tablet TAKE ONE TABLET BY MOUTH EVERY 6 HOURS AS NEEDED   magnesium oxide (MAG-OX) 400 (240 Mg) MG tablet Take 1 tablet (400 mg total) by mouth daily.   metoprolol tartrate (LOPRESSOR) 25 MG tablet Take 1 tablet (25 mg total) by mouth 2 (two) times daily.   omeprazole (PRILOSEC) 20 MG capsule Take 1 capsule (20 mg total) by mouth 2 (two) times daily before a meal. Take 1 capsule (20 mg total) two times a day for 30 days and then take 1 capsule a day for two months.   ondansetron (ZOFRAN) 4 MG tablet Take 1 tablet (4 mg total) by mouth every 8 (eight) hours as needed for nausea or vomiting.   OVER THE COUNTER MEDICATION Take 135 mg by mouth at bedtime. MaryRuth's liposomal magnesium 170m/15ml   OVER THE COUNTER MEDICATION Take 18 mg by mouth at bedtime. MaryRuth's liquid iron 14m15ml   OZEMPIC, 0.25 OR 0.5  MG/DOSE, 2 MG/1.5ML SOPN DIAL AND INJECT UNDER THE SKIN 0.5 MG WEEKLY   OZEMPIC, 1 MG/DOSE, 4 MG/3ML SOPN DIAL AND INJECT UNDER THE SKIN 1 MG WEEKLY   PARoxetine (PAXIL) 10 MG tablet Take 1 tablet (10 mg total) by mouth daily.   Polyethyl Glycol-Propyl Glycol (SYSTANE ULTRA OP) Place 1 drop into both eyes every 6 (six) hours as needed (dry eyes).   No facility-administered encounter medications on file as of 03/19/2021.     Lab Results  Component Value Date   WBC 5.8 03/19/2021   HGB 13.2 03/19/2021   HCT 40.8 03/19/2021   PLT 284.0 03/19/2021   GLUCOSE 92 03/19/2021   CHOL 193 03/19/2021   TRIG 156.0 (H) 03/19/2021   HDL 51.30 03/19/2021   LDLCALC 111 (H) 03/19/2021   ALT 8 03/19/2021   AST 12 03/19/2021   NA 134 (L) 03/19/2021   K 4.1 03/19/2021   CL 100 03/19/2021   CREATININE 0.70 03/19/2021   BUN 6 03/19/2021   CO2 27 03/19/2021   TSH 3.08 03/19/2021       Assessment & Plan:   Problem List Items Addressed This Visit     Abnormal uterine bleeding    Still with daily vaginal bleeding.  Saw gyn as outlined.  On HRT.  Changed to oral regimen.  She requested second opinion.  Would like to discuss if other treatment options.       Relevant Orders   Ambulatory referral to Gynecology   Barrett's esophagus without dysplasia    Followed by GI.  Continue prilosec.  No upper symptoms reported.       Essential hypertension, benign    Blood pressure remaining elevated.  Start metoprolol 2534mid.  See if helps with headaches. Follow pressures.  Send in readings. Get her back in soon to reassess.  Check metabolic panel.       Relevant Medications   metoprolol tartrate (LOPRESSOR) 25 MG tablet   GERD (gastroesophageal reflux disease)    Has seen GI.  Continue omeprazole.        Headache    Increased headaches as outlined.  Evaluation in ER reviewed.  Diagnosed with migraines.  (visual auras, etc).  Taking excedrin migraine.  Blood pressure elevated.  Increased stress.   Discussed my concern regarding oral estrogen with history of migraines and elevated blood pressure.  Discussed desire to get her off of this medication.  Treat stress - paxil and buspar.  Treat blood pressure - will start with metoprolol 44m bid.  See if can help prevent headaches.  Follow pressures.  Also start magoxide 4088mdaily.  D/w gyn - other alternatives for her bleeding and "menopausal" symptoms. Also schedule MRI - given above.  Has never had a head scan.         Relevant Medications   metoprolol tartrate (LOPRESSOR) 25 MG tablet   Other Relevant Orders   MR Brain W Wo Contrast   Iron deficiency anemia    Has seen hematology previously.  Follow cbc and iron studies.        Relevant Medications   Fe Fum-FePoly-Vit C-Vit B3 (INTEGRA) 62.5-62.5-40-3 MG CAPS   Other Relevant Orders   CBC with Differential/Platelet (Completed)   Basic metabolic panel (Completed)   IBC + Ferritin (Completed)   TSH (Completed)   CBC w/Diff   Ferritin   Leukocytosis    Recheck cbc.       Stress    Increased stress as outlined.  Discussed.  On paxil.  GYN recently added buspar.  Follow.       Other Visit Diagnoses     Visit for screening mammogram       Relevant Orders   MM 3D SCREEN BREAST BILATERAL   Screening cholesterol level       Relevant Orders   Hepatic function panel (Completed)   Lipid panel (Completed)   Screening for HIV without presence of risk factors       Relevant Orders   HIV antibody (with reflex) (Completed)   Encounter for hepatitis C screening test for low risk patient       Relevant Orders   Hepatitis C Antibody (Completed)   Need for immunization against influenza       Relevant Orders   Flu Vaccine QUAD 42m78mo (Fluarix, Fluzone & Alfiuria Quad PF) (Completed)       I spent 45 minutes with the patient and more than 50% of the time was spent in consultation regarding the above.  Time spent discussing current symptoms and concerns.  Time also spent discussing  treatment options and plans for further evaluation and treatment.     ChaEinar PheasantD

## 2021-03-20 LAB — HIV ANTIBODY (ROUTINE TESTING W REFLEX): HIV 1&2 Ab, 4th Generation: NONREACTIVE

## 2021-03-20 LAB — HEPATITIS C ANTIBODY
Hepatitis C Ab: NONREACTIVE
SIGNAL TO CUT-OFF: 0.1 (ref <1.00)

## 2021-03-20 MED ORDER — INTEGRA 62.5-62.5-40-3 MG PO CAPS: 1.0000 | ORAL_CAPSULE | Freq: Every day | ORAL | 1 refills | Status: DC

## 2021-03-21 ENCOUNTER — Other Ambulatory Visit: Payer: Self-pay | Admitting: Obstetrics and Gynecology

## 2021-03-21 DIAGNOSIS — F322 Major depressive disorder, single episode, severe without psychotic features: Secondary | ICD-10-CM

## 2021-03-24 ENCOUNTER — Encounter: Payer: Self-pay | Admitting: Internal Medicine

## 2021-03-24 DIAGNOSIS — I10 Essential (primary) hypertension: Secondary | ICD-10-CM | POA: Insufficient documentation

## 2021-03-24 NOTE — Assessment & Plan Note (Addendum)
Increased headaches as outlined.  Evaluation in ER reviewed.  Diagnosed with migraines.  (visual auras, etc).  Taking excedrin migraine.  Blood pressure elevated.  Increased stress.  Discussed my concern regarding oral estrogen with history of migraines and elevated blood pressure.  Discussed desire to get her off of this medication.  Treat stress - paxil and buspar.  Treat blood pressure - will start with metoprolol 25mg  bid.  See if can help prevent headaches.  Follow pressures.  Also start magoxide 400mg  daily.  D/w gyn - other alternatives for her bleeding and "menopausal" symptoms. Also schedule MRI - given above.  Has never had a head scan.

## 2021-03-24 NOTE — Assessment & Plan Note (Signed)
Increased stress as outlined.  Discussed.  On paxil.  GYN recently added buspar.  Follow.

## 2021-03-24 NOTE — Assessment & Plan Note (Signed)
Recheck cbc.  

## 2021-03-24 NOTE — Assessment & Plan Note (Signed)
Still with daily vaginal bleeding.  Saw gyn as outlined.  On HRT.  Changed to oral regimen.  She requested second opinion.  Would like to discuss if other treatment options.

## 2021-03-24 NOTE — Assessment & Plan Note (Signed)
Followed by GI.  Continue prilosec.  No upper symptoms reported.  

## 2021-03-24 NOTE — Assessment & Plan Note (Signed)
Blood pressure remaining elevated.  Start metoprolol 25mg  bid.  See if helps with headaches. Follow pressures.  Send in readings. Get her back in soon to reassess.  Check metabolic panel.

## 2021-03-24 NOTE — Assessment & Plan Note (Signed)
Has seen hematology previously.  Follow cbc and iron studies.   

## 2021-03-24 NOTE — Assessment & Plan Note (Signed)
Has seen GI.  Continue omeprazole.

## 2021-03-26 ENCOUNTER — Telehealth: Payer: Self-pay | Admitting: Internal Medicine

## 2021-03-26 NOTE — Telephone Encounter (Signed)
Lft pt vm to call ofc to sch MRI brain. thanks

## 2021-04-02 ENCOUNTER — Telehealth: Payer: Self-pay | Admitting: Internal Medicine

## 2021-04-02 NOTE — Telephone Encounter (Signed)
Lft pt vm to call ofc to sch MRI brain. thanks

## 2021-04-03 ENCOUNTER — Telehealth: Payer: Self-pay | Admitting: *Deleted

## 2021-04-03 ENCOUNTER — Encounter: Payer: Self-pay | Admitting: Internal Medicine

## 2021-04-03 NOTE — Telephone Encounter (Signed)
Discussed with Dr [name]. Will find work in appt.

## 2021-04-03 NOTE — Telephone Encounter (Signed)
Needs triage

## 2021-04-04 ENCOUNTER — Encounter: Payer: Self-pay | Admitting: Internal Medicine

## 2021-04-04 ENCOUNTER — Telehealth (INDEPENDENT_AMBULATORY_CARE_PROVIDER_SITE_OTHER): Payer: 59 | Admitting: Internal Medicine

## 2021-04-04 ENCOUNTER — Telehealth: Payer: Self-pay | Admitting: Internal Medicine

## 2021-04-04 DIAGNOSIS — D509 Iron deficiency anemia, unspecified: Secondary | ICD-10-CM

## 2021-04-04 DIAGNOSIS — F439 Reaction to severe stress, unspecified: Secondary | ICD-10-CM | POA: Diagnosis not present

## 2021-04-04 DIAGNOSIS — K219 Gastro-esophageal reflux disease without esophagitis: Secondary | ICD-10-CM

## 2021-04-04 DIAGNOSIS — I10 Essential (primary) hypertension: Secondary | ICD-10-CM

## 2021-04-04 MED ORDER — TELMISARTAN 20 MG PO TABS: 20.0000 mg | ORAL_TABLET | Freq: Every day | ORAL | 2 refills | Status: DC

## 2021-04-04 NOTE — Progress Notes (Signed)
Patient ID: Brianna [name], female   DOB: 09/13/1970, 50 y.o.   MRN: [id number]   Virtual Visit via video Note  This visit type was conducted due to national recommendations for restrictions regarding the COVID-19 pandemic (e.g. social distancing).  This format is felt to be most appropriate for this patient at this time.  All issues noted in this document were discussed and addressed.  No physical exam was performed (except for noted visual exam findings with Video Visits).   I connected with Cheray Pardi today by a video enabled telemedicine application and verified that I am speaking with the correct person using two identifiers. Location patient: home Location provider: work Persons participating in the virtual visit: patient, provider  The limitations, risks, security and privacy concerns of performing an evaluation and management service by video and the availability of in person appointments have been discussed.  It has also been discussed with the patient that there may be a patient responsible charge related to this service. The patient expressed understanding and agreed to proceed.   Reason for visit: work in appt  HPI: Recently evaluated:  increased stress, elevated blood pressure and headaches.  Was started on magnesium and metoprolol.  Blood pressure remains elevated - 130/94.  Headaches appear to be some better.  Was started on buspar by gyn.  Feels she is not sleeping well.  Feels medication is aggravating - weird dreams, sweating.  Stress is better.  Discussed tapering buspar.  No chest pain or sob reported.  Eating.  No abdominal pain or bowel change reported.     ROS: See pertinent positives and negatives per HPI.  Past Medical History:  Diagnosis Date   Anemia    Chicken pox    Depression    GERD (gastroesophageal reflux disease)    Iron deficiency anemia 06/17/2018   Jaundice due to hepatitis    Restless leg syndrome     Past Surgical History:  Procedure Laterality  Date   CESAREAN SECTION  [phone]   CHOLECYSTECTOMY  2003   COLONOSCOPY WITH PROPOFOL N/A 08/22/2019   Procedure: COLONOSCOPY WITH PROPOFOL;  Surgeon: Virgel Manifold, MD;  Location: [facility];  Service: Endoscopy;  Laterality: N/A;   ESOPHAGOGASTRODUODENOSCOPY (EGD) WITH PROPOFOL N/A 08/22/2019   Procedure: ESOPHAGOGASTRODUODENOSCOPY (EGD) WITH PROPOFOL;  Surgeon: Virgel Manifold, MD;  Location: [facility];  Service: Endoscopy;  Laterality: N/A;   HERNIA REPAIR     HYSTEROSCOPY WITH D & C N/A 08/28/2020   Procedure: DILATATION AND CURETTAGE /HYSTEROSCOPY;  Surgeon: Homero Fellers, MD;  Location: [facility];  Service: Gynecology;  Laterality: N/A;    Family History  Problem Relation Age of Onset   Early death Mother    Heart disease Mother    Hyperlipidemia Mother    Hypertension Mother    Alcohol abuse Father    Arthritis Father    COPD Father    Hyperlipidemia Father    Hypertension Father    Kidney disease Father    Arthritis Maternal Grandmother    Stroke Maternal Grandmother    Hyperlipidemia Maternal Grandfather    Breast cancer Paternal Aunt 29    SOCIAL HX: reviewed.    Current Outpatient Medications:    telmisartan (MICARDIS) 20 MG tablet, Take 1 tablet (20 mg total) by mouth daily., Disp: 30 tablet, Rfl: 2   acetaminophen (TYLENOL) 500 MG tablet, Take 1 tablet (500 mg total) by mouth every 6 (six) hours., Disp: 60 tablet, Rfl: 0   busPIRone (BUSPAR) 10 MG  tablet, Take 1 tablet (10 mg total) by mouth 3 (three) times daily., Disp: 90 tablet, Rfl: 5   clobetasol ointment (TEMOVATE) 0.05 %, Apply to affected area twice a day for 4 weeks, then every night for 4 weeks and then twice a week for 4 weeks or until resolution. (Patient taking differently: Apply 1 application topically daily as needed (rash).), Disp: 30 g, Rfl: 5   estradiol-norethindrone (ACTIVELLA) 1-0.5 MG tablet, Take 1 tablet by mouth daily., Disp: 30 tablet, Rfl: 12   Fe  Fum-FePoly-Vit C-Vit B3 (INTEGRA) 62.5-62.5-40-3 MG CAPS, Take 1 capsule by mouth daily., Disp: 90 capsule, Rfl: 1   ibuprofen (ADVIL) 600 MG tablet, TAKE ONE TABLET BY MOUTH EVERY 6 HOURS AS NEEDED, Disp: 60 tablet, Rfl: 0   magnesium oxide (MAG-OX) 400 (240 Mg) MG tablet, Take 1 tablet (400 mg total) by mouth daily., Disp: 30 tablet, Rfl: 2   metoprolol tartrate (LOPRESSOR) 25 MG tablet, Take 1 tablet (25 mg total) by mouth 2 (two) times daily., Disp: 60 tablet, Rfl: 2   omeprazole (PRILOSEC) 20 MG capsule, Take 1 capsule (20 mg total) by mouth 2 (two) times daily before a meal. Take 1 capsule (20 mg total) two times a day for 30 days and then take 1 capsule a day for two months., Disp: 60 capsule, Rfl: 2   ondansetron (ZOFRAN) 4 MG tablet, Take 1 tablet (4 mg total) by mouth every 8 (eight) hours as needed for nausea or vomiting., Disp: 20 tablet, Rfl: 0   OVER THE COUNTER MEDICATION, Take 135 mg by mouth at bedtime. MaryRuth's liposomal magnesium 135mg /63ml, Disp: , Rfl:    OVER THE COUNTER MEDICATION, Take 18 mg by mouth at bedtime. MaryRuth's liquid iron 18mg /52ml, Disp: , Rfl:    OZEMPIC, 0.25 OR 0.5 MG/DOSE, 2 MG/1.5ML SOPN, DIAL AND INJECT UNDER THE SKIN 0.5 MG WEEKLY, Disp: 1.5 mL, Rfl: 1   OZEMPIC, 1 MG/DOSE, 4 MG/3ML SOPN, DIAL AND INJECT UNDER THE SKIN 1 MG WEEKLY, Disp: 3 mL, Rfl: 1   PARoxetine (PAXIL) 10 MG tablet, TAKE ONE TABLET BY MOUTH DAILY, Disp: 90 tablet, Rfl: 3   Polyethyl Glycol-Propyl Glycol (SYSTANE ULTRA OP), Place 1 drop into both eyes every 6 (six) hours as needed (dry eyes)., Disp: , Rfl:   EXAM:  GENERAL: alert, oriented, appears well and in no acute distress  HEENT: atraumatic, conjunttiva clear, no obvious abnormalities on inspection of external nose and ears  NECK: normal movements of the head and neck  LUNGS: on inspection no signs of respiratory distress, breathing rate appears normal, no obvious gross SOB, gasping or wheezing  CV: no obvious  cyanosis  PSYCH/NEURO: pleasant and cooperative, no obvious depression or anxiety, speech and thought processing grossly intact  ASSESSMENT AND PLAN:  Discussed the following assessment and plan:  Problem List Items Addressed This Visit     Essential hypertension, benign    Blood pressure remains elevated.  On metoprolol 25mg  bid.  Some fatigue, but is some better.  Persistent elevation in blood pressure.  Start micardis 20mg  q day.  Follow pressures.  Check metabolic panel in 2 weeks.        Relevant Medications   telmisartan (MICARDIS) 20 MG tablet   Other Relevant Orders   Basic metabolic panel   GERD (gastroesophageal reflux disease)    Has seen GI.  Continue omeprazole.        Iron deficiency anemia    Has seen hematology previously.  Follow cbc and iron studies.  Stress    On paxil.  Has been on buspar.  Feels is aggravating - not able to sleep, etc.  Taper off buspar.  Continue paxil.  Follow.         Return if symptoms worsen or fail to improve, for keep scheduled.   I discussed the assessment and treatment plan with the patient. The patient was provided an opportunity to ask questions and all were answered. The patient agreed with the plan and demonstrated an understanding of the instructions.   The patient was advised to call back or seek an in-person evaluation if the symptoms worsen or if the condition fails to improve as anticipated.    Einar Pheasant, MD

## 2021-04-04 NOTE — Telephone Encounter (Signed)
Appt scheduled

## 2021-04-04 NOTE — Telephone Encounter (Signed)
Please schedule a met b to be drawn in 2 weeks.

## 2021-04-07 ENCOUNTER — Encounter: Payer: Self-pay | Admitting: Internal Medicine

## 2021-04-07 ENCOUNTER — Telehealth: Payer: Self-pay | Admitting: Internal Medicine

## 2021-04-07 NOTE — Telephone Encounter (Signed)
Please schedule Brianna [name] for a non fasting lab in 10 days.  Thanks

## 2021-04-07 NOTE — Assessment & Plan Note (Signed)
On paxil.  Has been on buspar.  Feels is aggravating - not able to sleep, etc.  Taper off buspar.  Continue paxil.  Follow.

## 2021-04-07 NOTE — Assessment & Plan Note (Signed)
Has seen hematology previously.  Follow cbc and iron studies.   

## 2021-04-07 NOTE — Assessment & Plan Note (Signed)
Has seen GI.  Continue omeprazole.

## 2021-04-07 NOTE — Assessment & Plan Note (Signed)
Blood pressure remains elevated.  On metoprolol 25mg  bid.  Some fatigue, but is some better.  Persistent elevation in blood pressure.  Start micardis 20mg  q day.  Follow pressures.  Check metabolic panel in 2 weeks.

## 2021-04-08 ENCOUNTER — Other Ambulatory Visit: Payer: Self-pay | Admitting: Internal Medicine

## 2021-04-08 DIAGNOSIS — Z6834 Body mass index (BMI) 34.0-34.9, adult: Secondary | ICD-10-CM

## 2021-04-08 NOTE — Telephone Encounter (Signed)
Pt already scheduled

## 2021-04-11 ENCOUNTER — Other Ambulatory Visit: Payer: Self-pay | Admitting: Obstetrics and Gynecology

## 2021-04-11 ENCOUNTER — Encounter: Payer: Self-pay | Admitting: Internal Medicine

## 2021-04-12 ENCOUNTER — Telehealth: Payer: Self-pay

## 2021-04-12 NOTE — Telephone Encounter (Signed)
Diarrhea is not a common side effect of this blood pressure medication (or any blood pressure medication).  If symptoms have improved with stopping the medication - do recommend remaining off.  Need to know how blood pressures are running.  She may need to start another blood pressure medication.

## 2021-04-12 NOTE — Telephone Encounter (Signed)
Dr.Scott,   I started this medication last Friday, 12/10. Saturday 12/11 through today I have had diarrhea like I have never had in my entire life. I have washed my sheets, mattress pad and countless number of pants/underwear. When I say diarrhea I mean, I am pooping my pants without warning, and God help me if I fall asleepNever in my entire 50 years have I ever experienced anything like this. I have endured every poop joke from Valetta Fuller, known to man. He even made up a couple of new ones. Needless to say, I will not be taking this medication anymore. Looking forward to any suggestion/substitutions you might have. I wish you and yours a very Merry Christmas. So far, mine is shitty.

## 2021-04-12 NOTE — Telephone Encounter (Signed)
Called patient to discuss below. Confirmed she has stopped the telmisartan and diarrhea has resolved. No other acute symptoms. Advised to remain off of medication and we would discuss other treatment options

## 2021-04-12 NOTE — Telephone Encounter (Signed)
Patient aware of below. She has not checked BP but will monitor BP and send in readings. Will remain off Telmisartan.

## 2021-04-17 ENCOUNTER — Other Ambulatory Visit: Payer: Self-pay

## 2021-04-17 ENCOUNTER — Other Ambulatory Visit (INDEPENDENT_AMBULATORY_CARE_PROVIDER_SITE_OTHER): Payer: 59

## 2021-04-17 DIAGNOSIS — I10 Essential (primary) hypertension: Secondary | ICD-10-CM

## 2021-04-18 ENCOUNTER — Encounter: Payer: Self-pay | Admitting: Oncology

## 2021-04-18 LAB — BASIC METABOLIC PANEL
BUN: 8 mg/dL (ref 6–23)
CO2: 26 mEq/L (ref 19–32)
Calcium: 8.5 mg/dL (ref 8.4–10.5)
Chloride: 104 mEq/L (ref 96–112)
Creatinine, Ser: 0.72 mg/dL (ref 0.40–1.20)
GFR: 97.22 mL/min (ref >60.00)
Glucose, Bld: 94 mg/dL (ref 70–99)
Potassium: 4.7 mEq/L (ref 3.5–5.1)
Sodium: 136 mEq/L (ref 135–145)

## 2021-04-23 ENCOUNTER — Other Ambulatory Visit: Payer: Self-pay

## 2021-04-23 ENCOUNTER — Encounter: Payer: Self-pay | Admitting: Oncology

## 2021-04-23 ENCOUNTER — Ambulatory Visit: Admission: RE | Admit: 2021-04-23 | Payer: Self-pay | Source: Ambulatory Visit

## 2021-04-24 ENCOUNTER — Encounter: Payer: Self-pay | Admitting: Internal Medicine

## 2021-04-25 ENCOUNTER — Ambulatory Visit (INDEPENDENT_AMBULATORY_CARE_PROVIDER_SITE_OTHER): Payer: 59

## 2021-04-25 ENCOUNTER — Ambulatory Visit: Payer: 59 | Admitting: Podiatry

## 2021-04-25 ENCOUNTER — Ambulatory Visit: Payer: 59 | Admitting: Pharmacist

## 2021-04-25 ENCOUNTER — Encounter: Payer: Self-pay | Admitting: Podiatry

## 2021-04-25 ENCOUNTER — Other Ambulatory Visit: Payer: Self-pay

## 2021-04-25 DIAGNOSIS — Z6833 Body mass index (BMI) 33.0-33.9, adult: Secondary | ICD-10-CM

## 2021-04-25 DIAGNOSIS — M2041 Other hammer toe(s) (acquired), right foot: Secondary | ICD-10-CM

## 2021-04-25 DIAGNOSIS — M2011 Hallux valgus (acquired), right foot: Secondary | ICD-10-CM

## 2021-04-25 DIAGNOSIS — M2450 Contracture, unspecified joint: Secondary | ICD-10-CM

## 2021-04-25 DIAGNOSIS — M2012 Hallux valgus (acquired), left foot: Secondary | ICD-10-CM

## 2021-04-25 DIAGNOSIS — Z01818 Encounter for other preprocedural examination: Secondary | ICD-10-CM | POA: Diagnosis not present

## 2021-04-25 DIAGNOSIS — I10 Essential (primary) hypertension: Secondary | ICD-10-CM

## 2021-04-25 DIAGNOSIS — K2270 Barrett's esophagus without dysplasia: Secondary | ICD-10-CM

## 2021-04-25 MED ORDER — OMEPRAZOLE 20 MG PO CPDR: 20.0000 mg | DELAYED_RELEASE_CAPSULE | Freq: Every day | ORAL | 1 refills | Status: DC

## 2021-04-25 NOTE — Progress Notes (Signed)
° °   ° °  Chief Complaint  Patient presents with   Care Coordination    Follow Up    Brianna [name] is a 51 y.o. year old female who was referred for medication management by their primary care provider, Einar Pheasant, MD. They presented for a telephone visit in the context of the COVID-19 pandemic.   Subjective: Obesity/Overweight, Complicated by HTN, GERD:  Current medications: Ozempic 1 mg weekly   Baseline weight: 212 lbs; Most recent weight: 203 lbs  Current meal patterns: lean proteins, low carbohydrate meals.   Exercise: has 2 puppies, walks them for about an hour a day  Also requests a refill on omeprazole today   Hypertension: Current Medications: metoprolol tartrate 25 mg BID Previously tried: telmisartan -diarrhea   Does report fatigue. Discussed the role beta blockers play in this.     Objective:  Patient Active Problem List   Diagnosis Date Noted   Essential hypertension, benign 03/24/2021   Leukocytosis 03/19/2021   Headache 03/19/2021   Abnormal uterine bleeding    Postmenopausal bleeding    Hiatal hernia 06/20/2020   Stress 02/19/2020   Dysphagia    Barrett's esophagus without dysplasia    Stomach irritation    Weight gain 07/17/2019   Varicose veins of leg with pain, bilateral 07/17/2019   GERD (gastroesophageal reflux disease) 06/05/2019   Menstrual changes 06/05/2019   Encounter for screening colonoscopy 06/05/2019   Sweating increase 06/05/2019   Leiomyoma 06/29/2018   Iron deficiency anemia 06/17/2018   Restless leg 05/26/2018     Vitals with BMI 04/04/2021 03/19/2021 02/25/2021  Height 5\' 5"  5\' 4"  5\' 4"   Weight 203 lbs 205 lbs 3 oz 207 lbs 6 oz  BMI 33.78 46.56 81.27  Systolic 517 001 749  Diastolic 94 92 80  Pulse - 90 -     Lab Results  Component Value Date   CREATININE 0.72 04/17/2021   BUN 8 04/17/2021   NA 136 04/17/2021   K 4.7 04/17/2021   CL 104 04/17/2021   CO2 26 04/17/2021    Lab Results  Component Value Date    CHOL 193 03/19/2021   HDL 51.30 03/19/2021   LDLCALC 111 (H) 03/19/2021   TRIG 156.0 (H) 03/19/2021   CHOLHDL 4 03/19/2021     Assessment/Plan:   Hypertension: - Currently uncontrolled - Reviewed appropriate blood pressure monitoring technique and reviewed goal blood pressure. Recommended to check home blood pressure and heart rate several times weekly - Continue metoprolol succinate 25 mg BID. Consider ACEi as next agent.   Obesity/Overweight: - Currently unable to achieve goal weight loss of 5-10% through diet and lifestyle modifications alone - Extensive dietary counseling including education on focus on lean proteins, fruits and vegetables, whole grains and increased fiber consumption, adequate hydration - Extensive exercise counseling including eventual goal of 150 minutes of moderate intensity exercise weekly - Provided motivational interviewing. Discussed setting non-weight based goals - Recommend to start [facility] given medication availability. Will pursue PA today.    Barrett's Esophagus/GERD: Script for omeprazole sent under PCP. Patient encouraged to have GI follow up.   Follow Up Plan: Follow for medication access  Catie Darnelle Maffucci, PharmD, [place], [place] Clinical Pharmacist [employer] at [facility] [phone]

## 2021-04-25 NOTE — Patient Instructions (Signed)
Makela,   I will see if your insurance will cover [phone].   For now, restart Ozempic - Inject 1.19 mg (19 clicks) once weekly for 2 weeks, then increase to 0.5 mg (37 clicks) once weekly for at least another 2 weeks. You can increase back to 1 mg weekly as tolerated.   Catie Darnelle Maffucci, PharmD

## 2021-04-26 ENCOUNTER — Telehealth: Payer: Self-pay | Admitting: Pharmacist

## 2021-04-26 NOTE — Telephone Encounter (Signed)
Per [employer] (PBM), appeal for Brianna [name] must be submitted to [employer] directly as Brianna [name] is non-formulary. Submitted today

## 2021-04-29 ENCOUNTER — Other Ambulatory Visit: Payer: Self-pay

## 2021-04-29 ENCOUNTER — Other Ambulatory Visit: Payer: Self-pay | Admitting: Internal Medicine

## 2021-04-29 ENCOUNTER — Other Ambulatory Visit (INDEPENDENT_AMBULATORY_CARE_PROVIDER_SITE_OTHER): Payer: 59

## 2021-04-29 DIAGNOSIS — D509 Iron deficiency anemia, unspecified: Secondary | ICD-10-CM

## 2021-04-29 LAB — CBC WITH DIFFERENTIAL/PLATELET
Basophils Absolute: 0.1 10*3/uL (ref 0.0–0.1)
Basophils Relative: 0.8 % (ref 0.0–3.0)
Eosinophils Absolute: 0.1 10*3/uL (ref 0.0–0.7)
Eosinophils Relative: 1.3 % (ref 0.0–5.0)
HCT: 44 % (ref 36.0–46.0)
Hemoglobin: 14.4 g/dL (ref 12.0–15.0)
Lymphocytes Relative: 23.3 % (ref 12.0–46.0)
Lymphs Abs: 1.6 10*3/uL (ref 0.7–4.0)
MCHC: 32.7 g/dL (ref 30.0–36.0)
MCV: 94.9 fl (ref 78.0–100.0)
Monocytes Absolute: 0.4 10*3/uL (ref 0.1–1.0)
Monocytes Relative: 6.3 % (ref 3.0–12.0)
Neutro Abs: 4.6 10*3/uL (ref 1.4–7.7)
Neutrophils Relative %: 68.3 % (ref 43.0–77.0)
Platelets: 264 10*3/uL (ref 150.0–400.0)
RBC: 4.64 Mil/uL (ref 3.87–5.11)
RDW: 17.6 % — ABNORMAL HIGH (ref 11.5–15.5)
WBC: 6.8 10*3/uL (ref 4.0–10.5)

## 2021-04-29 LAB — FERRITIN: Ferritin: 26 ng/mL (ref 10.0–291.0)

## 2021-04-29 NOTE — Telephone Encounter (Signed)
Catie submitted a appeal on medication today. Awaiting a response.  Roshell Brigham,cma

## 2021-04-30 NOTE — Telephone Encounter (Signed)
Called [employer] plan to follow up on status of appeal. They said appeal was received 1/6. Reports it was denied as it was not covered a benefit (the plan does not cover weight management).   Called patient to notify. She verbalizes understanding. Will complete supply of Ozempic and then stop therapy.

## 2021-04-30 NOTE — Addendum Note (Signed)
Addended by: De Hollingshead on: 04/30/2021 03:11 PM   Modules accepted: Orders

## 2021-04-30 NOTE — Progress Notes (Signed)
Subjective:  Patient ID: Brianna [name], female    DOB: [birth year],  MRN: [id number]  Chief Complaint  Patient presents with   Foot Pain    "I've got bunions."    51 y.o. female presents with the above complaint.  Patient presents with bilateral bunion pains right greater than left side.  Patient said there is severe in nature.  She also has secondary painful hammertoes as well.  She would like to discuss treatment options for this.  She states that they are progressive gotten worse.  She denies being diabetic.  She would like to have the bunion is corrected she is tried all conservative treatment options including padding protecting shoe gear modification none of which has given her relief.  Her pain scale is 7 out of 10.  Is painful on palpation painful with ambulation.  She would like to have it removed.  Review of Systems: Negative except as noted in the HPI. Denies N/V/F/Ch.  Past Medical History:  Diagnosis Date   Anemia    Chicken pox    Depression    GERD (gastroesophageal reflux disease)    Iron deficiency anemia 06/17/2018   Jaundice due to hepatitis    Restless leg syndrome     Current Outpatient Medications:    acetaminophen (TYLENOL) 500 MG tablet, Take 1 tablet (500 mg total) by mouth every 6 (six) hours., Disp: 60 tablet, Rfl: 0   clobetasol ointment (TEMOVATE) 0.05 %, Apply to affected area twice a day for 4 weeks, then every night for 4 weeks and then twice a week for 4 weeks or until resolution. (Patient not taking: Reported on 04/25/2021), Disp: 30 g, Rfl: 5   estradiol-norethindrone (ACTIVELLA) 1-0.5 MG tablet, Take 1 tablet by mouth daily., Disp: 30 tablet, Rfl: 12   Fe Fum-FePoly-Vit C-Vit B3 (INTEGRA) 62.5-62.5-40-3 MG CAPS, Take 1 capsule by mouth daily., Disp: 90 capsule, Rfl: 1   magnesium oxide (MAG-OX) 400 (240 Mg) MG tablet, Take 1 tablet (400 mg total) by mouth daily., Disp: 30 tablet, Rfl: 2   metoprolol tartrate (LOPRESSOR) 25 MG tablet, Take 1 tablet (25 mg  total) by mouth 2 (two) times daily., Disp: 60 tablet, Rfl: 2   omeprazole (PRILOSEC) 20 MG capsule, Take 1 capsule (20 mg total) by mouth daily., Disp: 90 capsule, Rfl: 1   OZEMPIC, 1 MG/DOSE, 4 MG/3ML SOPN, DIAL AND INJECT UNDER THE SKIN 1 MG WEEKLY (Patient not taking: Reported on 04/25/2021), Disp: 3 mL, Rfl: 1   PARoxetine (PAXIL) 10 MG tablet, TAKE ONE TABLET BY MOUTH DAILY, Disp: 90 tablet, Rfl: 3   Polyethyl Glycol-Propyl Glycol (SYSTANE ULTRA OP), Place 1 drop into both eyes every 6 (six) hours as needed (dry eyes)., Disp: , Rfl:   Social History   Tobacco Use  Smoking Status Former  Smokeless Tobacco Never    Allergies  Allergen Reactions   Hydrocodone Hives   Objective:  There were no vitals filed for this visit. There is no height or weight on file to calculate BMI. Constitutional Well developed. Well nourished.  Vascular Dorsalis pedis pulses palpable bilaterally. Posterior tibial pulses palpable bilaterally. Capillary refill normal to all digits.  No cyanosis or clubbing noted. Pedal hair growth normal.  Neurologic Normal speech. Oriented to person, place, and time. Epicritic sensation to light touch grossly present bilaterally.  Dermatologic Nails well groomed and normal in appearance. No open wounds. No skin lesions.  Orthopedic: Normal joint ROM without pain or crepitus bilaterally. Hallux abductovalgus deformity present bilateral severe right  greater than left side.  This is a track bound not a tracking deformity.  No intra-articular first MPJ pain noted.  Pain on palpation to the bunion deformity Left 1st MPJ diminished range of motion. Left 1st TMT with gross hypermobility. Right 1st MPJ diminished range of motion  Right 1st TMT with gross hypermobility. Lesser digital contractures present bilaterally.  Bilateral second digit hammertoe contracture semiflexible in nature pain on palpation.  Joint contracture noted at the second metatarsophalangeal joint    Radiographs: Taken and reviewed. Hallux abductovalgus deformity present severe in nature. Metatarsal parabola normal. 1st/2nd IMA: 17; TSP: 6 out of 7.  No intra-articular first MPJ arthritis noted.  Hallux valgus angle increased noted.  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  His findings are consistent bilaterally.  Hammertoe contracture noted of second digit bilaterally  Assessment:   1. Hallux abducto valgus, bilateral   2. Preoperative examination   3. Hammertoe of second toe of right foot   4. Joint contracture    Plan:  Patient was evaluated and treated and all questions answered.  Hallux abductovalgus deformity, right greater than left side -XR as above. -Patient has failed all conservative therapy and wishes to proceed with surgical intervention. All risks, benefits, and alternatives discussed with patient. No guarantees given. Consent reviewed and signed by patient. Post-op course explained at length. -Planned procedures: Right Lapidus fusion with hammertoe correction of the second with possible metatarsophalangeal joint capsulotomy -Risk factors: None -I discussed my preoperative intraoperative postoperative plan in extensive detail.  I believe patient will benefit from Lapiplasty procedure to reduce the bunion deformity as well as address a second digit hammertoe contracture with underlying possible metatarsophalangeal joint contracture of the second.  I discussed my surgical plan in extensive detail with the patient.  Should be nonweightbearing to the right lower extremity.  We will plan on doing the right side first followed by left side.  At this time we will hold off on met adductus procedure as patient does not want undergo midfoot procedure and I am hopeful that I will be able to get the correction as needed without undergoing a met adductus procedure.  I discussed with the patient in the event if needed in the future we can always address that at a later time.  She  states understanding. -We will plan on doing the right side first followed by the left side.  No follow-ups on file.

## 2021-05-16 ENCOUNTER — Other Ambulatory Visit: Payer: Self-pay

## 2021-05-16 ENCOUNTER — Encounter: Payer: Self-pay | Admitting: Oncology

## 2021-05-16 ENCOUNTER — Encounter: Payer: Self-pay | Admitting: Obstetrics and Gynecology

## 2021-05-16 ENCOUNTER — Ambulatory Visit (INDEPENDENT_AMBULATORY_CARE_PROVIDER_SITE_OTHER): Payer: 59 | Admitting: Obstetrics and Gynecology

## 2021-05-16 VITALS — BP 136/80 | HR 68 | Ht 65.0 in | Wt 215.0 lb

## 2021-05-16 DIAGNOSIS — N951 Menopausal and female climacteric states: Secondary | ICD-10-CM

## 2021-05-16 NOTE — Progress Notes (Signed)
HPI:      [name] is a 51 y.o. [phone] who LMP was No LMP recorded. Patient is perimenopausal.  Subjective:   She presents today to discuss issues surrounding menopause and postmenopausal bleeding.  Her story is somewhat convoluted and contains many different tracks.  A brief summary follows. Approximately 4 years ago patient went for 1 year without menses and was diagnosed as menopausal.  Soon thereafter she began having heavy bleeding.  She got an IUD to control the bleeding which worked for several months.  She then had a very heavy episode of bleeding and during the bleeding and clotting passed the IUD.  She was placed on progesterone for short course which stopped her bleeding.  She subsequently underwent a hysteroscopy D&C approximately 1.5 years ago.  This showed no evidence of hyperplasia or malignancy.  She was then begun on HRT (Activella).  Since beginning [place] she has had irregular bleeding again.  In addition in the last year and a half she has begun having significant hot flashes and night sweats as well as centripetal weight gain and has noticed mood changes.  Because of these she was begun on Paxil.  She presents here today to start fresh and get a new perspective regarding her menopausal symptoms and seemingly postmenopausal bleeding.  She is behind on her mammography and needs a Pap smear in the next few months as part of annual health maintenance.    Hx: The following portions of the patient's history were reviewed and updated as appropriate:             She  has a past medical history of Anemia, Chicken pox, Depression, GERD (gastroesophageal reflux disease), Iron deficiency anemia (06/17/2018), Jaundice due to hepatitis, and Restless leg syndrome. She does not have any pertinent problems on file. She  has a past surgical history that includes Cholecystectomy (2003); Cesarean section [phone]); Colonoscopy with propofol (N/A, 08/22/2019);  Esophagogastroduodenoscopy (egd) with propofol (N/A, 08/22/2019); Hernia repair; and Hysteroscopy with D & C (N/A, 08/28/2020). Her family history includes Alcohol abuse in her father; Arthritis in her father and maternal grandmother; Breast cancer (age of onset: 62) in her paternal aunt; COPD in her father; Early death in her mother; Heart disease in her mother; Hyperlipidemia in her father, maternal grandfather, and mother; Hypertension in her father and mother; Kidney disease in her father; Stroke in her maternal grandmother. She  reports that she has quit smoking. She has never used smokeless tobacco. She reports current alcohol use of about 7.0 standard drinks per week. She reports that she does not use drugs. She has a current medication list which includes the following prescription(s): acetaminophen, estradiol-norethindrone, integra, magnesium oxide, metoprolol tartrate, omeprazole, paroxetine, polyethyl glycol-propyl glycol, and clobetasol ointment. She is allergic to hydrocodone.       Review of Systems:  Review of Systems  Constitutional: Denied constitutional symptoms, night sweats, recent illness, fatigue, fever, insomnia and weight loss.  Eyes: Denied eye symptoms, eye pain, photophobia, vision change and visual disturbance.  Ears/Nose/Throat/Neck: Denied ear, nose, throat or neck symptoms, hearing loss, nasal discharge, sinus congestion and sore throat.  Cardiovascular: Denied cardiovascular symptoms, arrhythmia, chest pain/pressure, edema, exercise intolerance, orthopnea and palpitations.  Respiratory: Denied pulmonary symptoms, asthma, pleuritic pain, productive sputum, cough, dyspnea and wheezing.  Gastrointestinal: Denied, gastro-esophageal reflux, melena, nausea and vomiting.  Genitourinary: See HPI for additional information.  Musculoskeletal: Denied musculoskeletal symptoms, stiffness, swelling, muscle weakness and myalgia.  Dermatologic: Denied dermatology symptoms, rash and  scar.  Neurologic: Denied neurology symptoms, dizziness, headache, neck pain and syncope.  Psychiatric: Denied psychiatric symptoms, anxiety and depression.  Endocrine: See HPI for additional information.   Meds:   Current Outpatient Medications on File Prior to Visit  Medication Sig Dispense Refill   acetaminophen (TYLENOL) 500 MG tablet Take 1 tablet (500 mg total) by mouth every 6 (six) hours. 60 tablet 0   estradiol-norethindrone (ACTIVELLA) 1-0.5 MG tablet Take 1 tablet by mouth daily. 30 tablet 12   Fe Fum-FePoly-Vit C-Vit B3 (INTEGRA) 62.5-62.5-40-3 MG CAPS Take 1 capsule by mouth daily. 90 capsule 1   magnesium oxide (MAG-OX) 400 (240 Mg) MG tablet Take 1 tablet (400 mg total) by mouth daily. 30 tablet 2   metoprolol tartrate (LOPRESSOR) 25 MG tablet Take 1 tablet (25 mg total) by mouth 2 (two) times daily. 60 tablet 2   omeprazole (PRILOSEC) 20 MG capsule Take 1 capsule (20 mg total) by mouth daily. 90 capsule 1   PARoxetine (PAXIL) 10 MG tablet TAKE ONE TABLET BY MOUTH DAILY 90 tablet 3   Polyethyl Glycol-Propyl Glycol (SYSTANE ULTRA OP) Place 1 drop into both eyes every 6 (six) hours as needed (dry eyes).     clobetasol ointment (TEMOVATE) 0.05 % Apply to affected area twice a day for 4 weeks, then every night for 4 weeks and then twice a week for 4 weeks or until resolution. (Patient not taking: Reported on 05/16/2021) 30 g 5   No current facility-administered medications on file prior to visit.      Objective:     Vitals:   05/16/21 1343  BP: 136/80  Pulse: 68  SpO2: 99%   Filed Weights   05/16/21 1343  Weight: 215 lb (97.5 kg)             Previous pathology reviewed.          Assessment:    [id number] Patient Active Problem List   Diagnosis Date Noted   Essential hypertension, benign 03/24/2021   Leukocytosis 03/19/2021   Headache 03/19/2021   Abnormal uterine bleeding    Postmenopausal bleeding    Hiatal hernia 06/20/2020   Stress 02/19/2020   Dysphagia     Barrett's esophagus without dysplasia    Stomach irritation    Weight gain 07/17/2019   Varicose veins of leg with pain, bilateral 07/17/2019   GERD (gastroesophageal reflux disease) 06/05/2019   Menstrual changes 06/05/2019   Encounter for screening colonoscopy 06/05/2019   Sweating increase 06/05/2019   Leiomyoma 06/29/2018   Iron deficiency anemia 06/17/2018   Restless leg 05/26/2018     1. Symptomatic menopausal or female climacteric states     Patient having signs and symptoms of menopause as well as postmenopausal bleeding on Activella.  Approximately a year and a half ago she had D&C hysteroscopy.  I think it unlikely that she has any evidence of hyperplasia at this time.  Weight gain likely secondary to change in metabolism at menopause  Patient having mood changes also likely secondary to menopause.  She has expressed a strong desire to get off Paxil.   Plan:            1.  After discussing multiple options for work-up and treatment we have decided upon the following plan.  -Return for Mirena IUD  -Begin either estrogen orally or estrogen patches at that time  -Wean off Paxil  -Follow-up approximately 3 months after beginning HRT for annual exam and Pap with scheduled mammography.  Orders No orders of the  defined types were placed in this encounter.   No orders of the defined types were placed in this encounter.     F/U  Return in about 2 weeks (around 05/30/2021). I spent 35 minutes involved in the care of this patient preparing to see the patient by obtaining and reviewing her medical history (including labs, imaging tests and prior procedures), documenting clinical information in the electronic health record (EHR), counseling and coordinating care plans, writing and sending prescriptions, ordering tests or procedures and in direct communicating with the patient and medical staff discussing pertinent items from her history and physical exam.  Finis Bud,  M.D. 05/16/2021 2:23 PM

## 2021-06-03 ENCOUNTER — Encounter: Payer: Self-pay | Admitting: Oncology

## 2021-06-05 ENCOUNTER — Ambulatory Visit (INDEPENDENT_AMBULATORY_CARE_PROVIDER_SITE_OTHER): Payer: 59 | Admitting: Obstetrics and Gynecology

## 2021-06-05 ENCOUNTER — Encounter: Payer: Self-pay | Admitting: Oncology

## 2021-06-05 ENCOUNTER — Encounter: Payer: Self-pay | Admitting: Obstetrics and Gynecology

## 2021-06-05 ENCOUNTER — Other Ambulatory Visit: Payer: Self-pay

## 2021-06-05 VITALS — BP 135/99 | HR 89 | Ht 65.0 in | Wt 217.4 lb

## 2021-06-05 DIAGNOSIS — Z30430 Encounter for insertion of intrauterine contraceptive device: Secondary | ICD-10-CM

## 2021-06-05 DIAGNOSIS — Z79890 Hormone replacement therapy (postmenopausal): Secondary | ICD-10-CM

## 2021-06-05 MED ORDER — ESTRADIOL 1 MG PO TABS: 1.0000 mg | ORAL_TABLET | Freq: Every day | ORAL | 0 refills | Status: DC

## 2021-06-05 NOTE — Progress Notes (Signed)
Patient presents for IUD insertion today.Patient states no other questions or concerns.

## 2021-06-05 NOTE — Progress Notes (Signed)
HPI:      [name] is a 51 y.o. [phone] who LMP was No LMP recorded (lmp unknown). Patient is perimenopausal.  Subjective:   She presents today for IUD insertion.  Her irregular spotting has decreased since beginning the [place] but not completely gone away.  She is here today for IUD insertion to help alleviate her spotting so that she can also take only estrogen for postmenopausal ERT. 1.5 years ago patient underwent hysteroscopy D&C which showed no evidence of hyperplasia or malignancy.    Hx: The following portions of the patient's history were reviewed and updated as appropriate:             She  has a past medical history of Anemia, Chicken pox, Depression, GERD (gastroesophageal reflux disease), Iron deficiency anemia (06/17/2018), Jaundice due to hepatitis, and Restless leg syndrome. She does not have any pertinent problems on file. She  has a past surgical history that includes Cholecystectomy (2003); Cesarean section [phone]); Colonoscopy with propofol (N/A, 08/22/2019); Esophagogastroduodenoscopy (egd) with propofol (N/A, 08/22/2019); Hernia repair; and Hysteroscopy with D & C (N/A, 08/28/2020). Her family history includes Alcohol abuse in her father; Arthritis in her father and maternal grandmother; Breast cancer (age of onset: 66) in her paternal aunt; COPD in her father; Early death in her mother; Heart disease in her mother; Hyperlipidemia in her father, maternal grandfather, and mother; Hypertension in her father and mother; Kidney disease in her father; Stroke in her maternal grandmother. She  reports that she has quit smoking. She has never used smokeless tobacco. She reports current alcohol use of about 7.0 standard drinks per week. She reports that she does not use drugs. She has a current medication list which includes the following prescription(s): acetaminophen, clobetasol ointment, estradiol-norethindrone, integra, magnesium oxide, metoprolol tartrate, omeprazole,  paroxetine, and polyethyl glycol-propyl glycol. She is allergic to hydrocodone.       Review of Systems:  Review of Systems  Constitutional: Denied constitutional symptoms, night sweats, recent illness, fatigue, fever, insomnia and weight loss.  Eyes: Denied eye symptoms, eye pain, photophobia, vision change and visual disturbance.  Ears/Nose/Throat/Neck: Denied ear, nose, throat or neck symptoms, hearing loss, nasal discharge, sinus congestion and sore throat.  Cardiovascular: Denied cardiovascular symptoms, arrhythmia, chest pain/pressure, edema, exercise intolerance, orthopnea and palpitations.  Respiratory: Denied pulmonary symptoms, asthma, pleuritic pain, productive sputum, cough, dyspnea and wheezing.  Gastrointestinal: Denied, gastro-esophageal reflux, melena, nausea and vomiting.  Genitourinary: See HPI for additional information.  Musculoskeletal: Denied musculoskeletal symptoms, stiffness, swelling, muscle weakness and myalgia.  Dermatologic: Denied dermatology symptoms, rash and scar.  Neurologic: Denied neurology symptoms, dizziness, headache, neck pain and syncope.  Psychiatric: Denied psychiatric symptoms, anxiety and depression.  Endocrine: Denied endocrine symptoms including hot flashes and night sweats.   Meds:   Current Outpatient Medications on File Prior to Visit  Medication Sig Dispense Refill   acetaminophen (TYLENOL) 500 MG tablet Take 1 tablet (500 mg total) by mouth every 6 (six) hours. 60 tablet 0   clobetasol ointment (TEMOVATE) 0.05 % Apply to affected area twice a day for 4 weeks, then every night for 4 weeks and then twice a week for 4 weeks or until resolution. 30 g 5   estradiol-norethindrone (ACTIVELLA) 1-0.5 MG tablet Take 1 tablet by mouth daily. 30 tablet 12   Fe Fum-FePoly-Vit C-Vit B3 (INTEGRA) 62.5-62.5-40-3 MG CAPS Take 1 capsule by mouth daily. 90 capsule 1   magnesium oxide (MAG-OX) 400 (240 Mg) MG tablet Take 1 tablet (400 mg total)  by mouth  daily. 30 tablet 2   metoprolol tartrate (LOPRESSOR) 25 MG tablet Take 1 tablet (25 mg total) by mouth 2 (two) times daily. 60 tablet 2   omeprazole (PRILOSEC) 20 MG capsule Take 1 capsule (20 mg total) by mouth daily. 90 capsule 1   PARoxetine (PAXIL) 10 MG tablet TAKE ONE TABLET BY MOUTH DAILY 90 tablet 3   Polyethyl Glycol-Propyl Glycol (SYSTANE ULTRA OP) Place 1 drop into both eyes every 6 (six) hours as needed (dry eyes).     No current facility-administered medications on file prior to visit.    Objective:     Vitals:   06/05/21 1424  BP: (!) 135/99  Pulse: 89    Physical examination   Pelvic:   Vulva: Normal appearance.  No lesions.  Vagina: No lesions or abnormalities noted.  Support: Normal pelvic support.  Urethra No masses tenderness or scarring.  Meatus Normal size without lesions or prolapse.  Cervix: Normal appearance.  No lesions.  Anus: Normal exam.  No lesions.  Perineum: Normal exam.  No lesions.        Bimanual   Uterus: Normal size.  Non-tender.  Mobile.  AV.  Adnexae: No masses.  Non-tender to palpation.  Cul-de-sac: Negative for abnormality.   IUD Procedure Pt has read the booklet and signed the appropriate forms regarding the Mirena IUD.  All of her questions have been answered.   The cervix was cleansed with betadine solution.  After sounding the uterus and noting the position, the IUD was placed in the usual manner without problem.  The string was cut to the appropriate length.  The patient tolerated the procedure well.            [place] # = [id number]   Assessment:    [id number] Patient Active Problem List   Diagnosis Date Noted   Essential hypertension, benign 03/24/2021   Leukocytosis 03/19/2021   Headache 03/19/2021   Abnormal uterine bleeding    Postmenopausal bleeding    Hiatal hernia 06/20/2020   Stress 02/19/2020   Dysphagia    Barrett's esophagus without dysplasia    Stomach irritation    Weight gain 07/17/2019   Varicose veins of  leg with pain, bilateral 07/17/2019   GERD (gastroesophageal reflux disease) 06/05/2019   Menstrual changes 06/05/2019   Encounter for screening colonoscopy 06/05/2019   Sweating increase 06/05/2019   Leiomyoma 06/29/2018   Iron deficiency anemia 06/17/2018   Restless leg 05/26/2018     1. Encounter for insertion of intrauterine contraceptive device (IUD)   2. Postmenopausal hormone therapy       Plan:             F/U  Return in about 4 weeks (around 07/03/2021) for For IUD f/u.  Finis Bud, M.D. 06/05/2021 2:52 PM

## 2021-06-07 ENCOUNTER — Telehealth: Payer: Self-pay | Admitting: Internal Medicine

## 2021-06-07 NOTE — Telephone Encounter (Signed)
Lft pt vm to call ofc to sch MRI. thanks 

## 2021-06-11 ENCOUNTER — Ambulatory Visit (INDEPENDENT_AMBULATORY_CARE_PROVIDER_SITE_OTHER): Payer: 59 | Admitting: Internal Medicine

## 2021-06-11 ENCOUNTER — Encounter: Payer: Self-pay | Admitting: Oncology

## 2021-06-11 ENCOUNTER — Encounter: Payer: Self-pay | Admitting: Internal Medicine

## 2021-06-11 ENCOUNTER — Other Ambulatory Visit: Payer: Self-pay

## 2021-06-11 DIAGNOSIS — K219 Gastro-esophageal reflux disease without esophagitis: Secondary | ICD-10-CM | POA: Diagnosis not present

## 2021-06-11 DIAGNOSIS — N939 Abnormal uterine and vaginal bleeding, unspecified: Secondary | ICD-10-CM | POA: Diagnosis not present

## 2021-06-11 DIAGNOSIS — R519 Headache, unspecified: Secondary | ICD-10-CM

## 2021-06-11 DIAGNOSIS — I10 Essential (primary) hypertension: Secondary | ICD-10-CM | POA: Diagnosis not present

## 2021-06-11 DIAGNOSIS — K2270 Barrett's esophagus without dysplasia: Secondary | ICD-10-CM

## 2021-06-11 DIAGNOSIS — D509 Iron deficiency anemia, unspecified: Secondary | ICD-10-CM

## 2021-06-11 DIAGNOSIS — F439 Reaction to severe stress, unspecified: Secondary | ICD-10-CM

## 2021-06-11 MED ORDER — GABAPENTIN 100 MG PO CAPS: 100.0000 mg | ORAL_CAPSULE | Freq: Every day | ORAL | 2 refills | Status: DC

## 2021-06-11 NOTE — Progress Notes (Signed)
Patient ID: Ethelene Closser, female   DOB: 06-04-1970, 51 y.o.   MRN: [id number]   Subjective:    Patient ID: [name], female    DOB: 09-Jul-1970, 51 y.o.   MRN: [id number]  This visit occurred during the SARS-CoV-2 public health emergency.  Safety protocols were in place, including screening questions prior to the visit, additional usage of staff PPE, and extensive cleaning of exam room while observing appropriate contact time as indicated for disinfecting solutions.   Patient here for a scheduled follow up.    HPI Here to follow up regarding increased stress, headaches and menorrhagia.  Saw Dr Amalia Hailey.  Mirena IUD.  Discussed weaning off paxil.  Bleeding much improved.  Off paxil.  Off estrogen and progesterone.  Still with headaches.  Taking excedrin migraine.  Headaches may occur 3-4x/week.  Having trouble falling and staying asleep.  Tried melatonin.  Discussed other treatment options.  Discussed mag oxide.  Discussed gabapentin.  Had previously ordered MRI.  F/u on scheduling.  Blood pressure appears to be better.  No chest pain.  Breathing stable.     Past Medical History:  Diagnosis Date   Anemia    Chicken pox    Depression    GERD (gastroesophageal reflux disease)    Iron deficiency anemia 06/17/2018   Jaundice due to hepatitis    Restless leg syndrome    Past Surgical History:  Procedure Laterality Date   CESAREAN SECTION  [id number]   CHOLECYSTECTOMY  2003   COLONOSCOPY WITH PROPOFOL N/A 08/22/2019   Procedure: COLONOSCOPY WITH PROPOFOL;  Surgeon: Virgel Manifold, MD;  Location: [facility];  Service: Endoscopy;  Laterality: N/A;   ESOPHAGOGASTRODUODENOSCOPY (EGD) WITH PROPOFOL N/A 08/22/2019   Procedure: ESOPHAGOGASTRODUODENOSCOPY (EGD) WITH PROPOFOL;  Surgeon: Virgel Manifold, MD;  Location: [facility];  Service: Endoscopy;  Laterality: N/A;   HERNIA REPAIR     HYSTEROSCOPY WITH D & C N/A 08/28/2020   Procedure: DILATATION AND CURETTAGE /HYSTEROSCOPY;   Surgeon: Homero Fellers, MD;  Location: [facility] OR[facility];  Service: Gynecology;  Laterality: N/A;   Family History  Problem Relation Age of Onset   Early death Mother    Heart disease Mother    Hyperlipidemia Mother    Hypertension Mother    Alcohol abuse Father    Arthritis Father    COPD Father    Hyperlipidemia Father    Hypertension Father    Kidney disease Father    Arthritis Maternal Grandmother    Stroke Maternal Grandmother    Hyperlipidemia Maternal Grandfather    Breast cancer Paternal Aunt 29   Social History   Socioeconomic History   Marital status: Married    Spouse name: Not on file   Number of children: Not on file   Years of education: Not on file   Highest education level: Not on file  Occupational History   Not on file  Tobacco Use   Smoking status: Former   Smokeless tobacco: Never  Vaping Use   Vaping Use: Never used  Substance and Sexual Activity   Alcohol use: Yes    Alcohol/week: 7.0 standard drinks    Types: 7 Glasses of wine per week    Comment: occasional    Drug use: Never   Sexual activity: Yes    Birth control/protection: Post-menopausal  Other Topics Concern   Not on file  Social History Narrative   Not on file   Social Determinants of Health   Financial Resource Strain: Low Risk  Difficulty of Paying Living Expenses: Not hard at all  Food Insecurity: Not on file  Transportation Needs: Not on file  Physical Activity: Not on file  Stress: Not on file  Social Connections: Not on file     Review of Systems  Constitutional:  Negative for appetite change and unexpected weight change.  HENT:  Negative for congestion and sinus pressure.   Respiratory:  Negative for cough, chest tightness and shortness of breath.   Cardiovascular:  Negative for chest pain, palpitations and leg swelling.  Gastrointestinal:  Negative for abdominal pain, diarrhea, nausea and vomiting.  Genitourinary:  Negative for difficulty urinating and dysuria.   Musculoskeletal:  Negative for joint swelling and myalgias.  Neurological:  Positive for headaches. Negative for dizziness.  Psychiatric/Behavioral:  Negative for agitation and dysphoric mood.       Objective:     BP 132/80 (BP Location: Left Arm, Patient Position: Sitting, Cuff Size: Small)    Pulse (!) 109    Temp 98 F (36.7 C) (Oral)    Resp 20    Ht 5\' 5"  (1.651 m)    Wt 223 lb (101.2 kg)    LMP  (LMP Unknown)    SpO2 97%    BMI 37.11 kg/m  Wt Readings from Last 3 Encounters:  06/11/21 223 lb (101.2 kg)  06/05/21 217 lb 6.4 oz (98.6 kg)  05/16/21 215 lb (97.5 kg)    Physical Exam Vitals reviewed.  Constitutional:      General: She is not in acute distress.    Appearance: Normal appearance.  HENT:     Head: Normocephalic and atraumatic.     Right Ear: External ear normal.     Left Ear: External ear normal.  Eyes:     General: No scleral icterus.       Right eye: No discharge.        Left eye: No discharge.     Conjunctiva/sclera: Conjunctivae normal.  Neck:     Thyroid: No thyromegaly.  Cardiovascular:     Rate and Rhythm: Normal rate and regular rhythm.  Pulmonary:     Effort: No respiratory distress.     Breath sounds: Normal breath sounds. No wheezing.  Abdominal:     General: Bowel sounds are normal.     Palpations: Abdomen is soft.     Tenderness: There is no abdominal tenderness.  Musculoskeletal:        General: No swelling or tenderness.     Cervical back: Neck supple. No tenderness.  Lymphadenopathy:     Cervical: No cervical adenopathy.  Skin:    Findings: No erythema or rash.  Neurological:     Mental Status: She is alert.  Psychiatric:        Mood and Affect: Mood normal.        Behavior: Behavior normal.     Outpatient Encounter Medications as of 06/11/2021  Medication Sig   acetaminophen (TYLENOL) 500 MG tablet Take 1 tablet (500 mg total) by mouth every 6 (six) hours.   clobetasol ointment (TEMOVATE) 0.05 % Apply to affected area twice a  day for 4 weeks, then every night for 4 weeks and then twice a week for 4 weeks or until resolution.   Fe Fum-FePoly-Vit C-Vit B3 (INTEGRA) 62.5-62.5-40-3 MG CAPS Take 1 capsule by mouth daily.   gabapentin (NEURONTIN) 100 MG capsule Take 1 capsule (100 mg total) by mouth at bedtime.   magnesium oxide (MAG-OX) 400 (240 Mg) MG tablet Take 1 tablet (  400 mg total) by mouth daily.   metoprolol tartrate (LOPRESSOR) 25 MG tablet Take 1 tablet (25 mg total) by mouth 2 (two) times daily.   omeprazole (PRILOSEC) 20 MG capsule Take 1 capsule (20 mg total) by mouth daily.   Polyethyl Glycol-Propyl Glycol (SYSTANE ULTRA OP) Place 1 drop into both eyes every 6 (six) hours as needed (dry eyes).   [DISCONTINUED] estradiol (ESTRACE) 1 MG tablet Take 1 tablet (1 mg total) by mouth daily.   [DISCONTINUED] PARoxetine (PAXIL) 10 MG tablet TAKE ONE TABLET BY MOUTH DAILY   No facility-administered encounter medications on file as of 06/11/2021.     Lab Results  Component Value Date   WBC 6.8 04/29/2021   HGB 14.4 04/29/2021   HCT 44.0 04/29/2021   PLT 264.0 04/29/2021   GLUCOSE 94 04/17/2021   CHOL 193 03/19/2021   TRIG 156.0 (H) 03/19/2021   HDL 51.30 03/19/2021   LDLCALC 111 (H) 03/19/2021   ALT 8 03/19/2021   AST 12 03/19/2021   NA 136 04/17/2021   K 4.7 04/17/2021   CL 104 04/17/2021   CREATININE 0.72 04/17/2021   BUN 8 04/17/2021   CO2 26 04/17/2021   TSH 3.08 03/19/2021       Assessment & Plan:   Problem List Items Addressed This Visit     Abnormal uterine bleeding    Saw gyn - Dr Amalia Hailey.  IUD in place.  Bleeding better.  Follow.       Barrett's esophagus without dysplasia    Followed by GI.  Continue prilosec.  No upper symptoms reported.       Essential hypertension, benign    Blood pressure as outlined.  On metoprolol 25mg  bid.  Follow pressures.  Hold on making changes in medication.  Follow       GERD (gastroesophageal reflux disease)    No upper symptoms.  On prilosec.        Headache    Persistent as outlined.  Gabapentin to see if can get her sleeping better.  Continue mag oxide.  F/u MRI as outlined.        Relevant Medications   gabapentin (NEURONTIN) 100 MG capsule   Iron deficiency anemia    Has seen hematology previously.  Follow cbc and iron studies.  Vaginal bleeding better.       Stress    Off paxil.  Overall feels she is doing better.  See if can get her sleeping better.  Gabapentin q hs.  Follow.         Einar Pheasant, MD

## 2021-06-12 ENCOUNTER — Telehealth: Payer: Self-pay | Admitting: Internal Medicine

## 2021-06-12 ENCOUNTER — Encounter: Payer: Self-pay | Admitting: Oncology

## 2021-06-12 MED ORDER — ALPRAZOLAM 0.25 MG PO TABS: ORAL_TABLET | ORAL | 0 refills | Status: DC

## 2021-06-12 NOTE — Telephone Encounter (Signed)
Pt has never taken lorazepam but tolerates xanax. Has had that in the past

## 2021-06-12 NOTE — Telephone Encounter (Signed)
I scheduled pt for her MRI pt asked if she can have something called in? Please advise and Thank you!  Pharmacy is  [facility] [id number] - [address], [place] Phone:  [phone]  Fax:  [phone]    Pt is scheduled on 06/25/2021

## 2021-06-12 NOTE — Telephone Encounter (Signed)
Please call and confirm if she has ever taken lorazepam previously.  If so, any problems?  If not, I can send in lorazepam .5mg  1/2 tablet to take just prior to scan

## 2021-06-12 NOTE — Telephone Encounter (Signed)
I sent in rx for xanax.  Take with her to MRI.

## 2021-06-13 ENCOUNTER — Telehealth: Payer: Self-pay | Admitting: Urology

## 2021-06-13 NOTE — Telephone Encounter (Signed)
DOS - 07/08/21  [employer] BUNIONECTOMY RIGHT --- [address] [employer] RIGHT --- [phone] [employer] REPA[employer] [facility] RIGHT --- [id number] CAPSULOTOMY [employer] RELEASE RIGHT --- [id number]   [employer] EFFECTIVE DATE - 04/21/21  RECEIVED FAX FROM [employer] STATING THAT CPT CODES [id number], [phone], [phone] AND [id number] HAVE BEEN APPROVED, AUTH # [id number], GOOD FROM 06/12/21 - 09/09/21.

## 2021-06-13 NOTE — Telephone Encounter (Signed)
Patient aware.

## 2021-06-17 ENCOUNTER — Encounter: Payer: Self-pay | Admitting: Internal Medicine

## 2021-06-17 NOTE — Assessment & Plan Note (Signed)
Off paxil.  Overall feels she is doing better.  See if can get her sleeping better.  Gabapentin q hs.  Follow.

## 2021-06-17 NOTE — Assessment & Plan Note (Signed)
Followed by GI.  Continue prilosec.  No upper symptoms reported.  

## 2021-06-17 NOTE — Assessment & Plan Note (Signed)
Persistent as outlined.  Gabapentin to see if can get her sleeping better.  Continue mag oxide.  F/u MRI as outlined.

## 2021-06-17 NOTE — Assessment & Plan Note (Signed)
Saw gyn - Dr Amalia Hailey.  IUD in place.  Bleeding better.  Follow.

## 2021-06-17 NOTE — Assessment & Plan Note (Signed)
No upper symptoms.  On prilosec.  

## 2021-06-17 NOTE — Assessment & Plan Note (Signed)
Blood pressure as outlined.  On metoprolol 25mg  bid.  Follow pressures.  Hold on making changes in medication.  Follow

## 2021-06-17 NOTE — Assessment & Plan Note (Signed)
Has seen hematology previously.  Follow cbc and iron studies.  Vaginal bleeding better.

## 2021-06-25 ENCOUNTER — Ambulatory Visit: Payer: Self-pay | Admitting: Pharmacist

## 2021-06-25 ENCOUNTER — Ambulatory Visit
Admission: RE | Admit: 2021-06-25 | Discharge: 2021-06-25 | Disposition: A | Discharge status: Home / Self Care | Payer: 59 | Source: Ambulatory Visit | Attending: Internal Medicine | Admitting: Internal Medicine

## 2021-06-25 DIAGNOSIS — R519 Headache, unspecified: Secondary | ICD-10-CM | POA: Diagnosis not present

## 2021-06-25 MED ORDER — GADOBUTROL 1 MMOL/ML IV SOLN
10.0000 mL | Freq: Once | INTRAVENOUS | Status: AC | PRN
  Administered 2021-06-25: 10 mL via INTRAVENOUS

## 2021-06-25 NOTE — Chronic Care Management (AMB) (Signed)
?  Chronic Care Management  ? ?Note ? ?06/25/2021 ?Name: Brianna [name] MRN: [id number] DOB: 10-27-1970 ? ? ? ?Closing pharmacy CCM case at this time.  Patient has clinic contact information for future questions or concerns.  ? ?Catie Darnelle Maffucci, PharmD, [place], CPP ?Clinical Pharmacist ?Therapist, music at [employer] ?[phone] ? ?

## 2021-06-28 ENCOUNTER — Other Ambulatory Visit: Payer: Self-pay | Admitting: Internal Medicine

## 2021-06-28 DIAGNOSIS — R519 Headache, unspecified: Secondary | ICD-10-CM

## 2021-06-28 DIAGNOSIS — R9089 Other abnormal findings on diagnostic imaging of central nervous system: Secondary | ICD-10-CM

## 2021-06-28 NOTE — Progress Notes (Signed)
Order placed for referral to neurology. Pt aware.  ?

## 2021-07-02 ENCOUNTER — Encounter: Payer: Self-pay | Admitting: Obstetrics and Gynecology

## 2021-07-02 ENCOUNTER — Other Ambulatory Visit: Payer: Self-pay

## 2021-07-02 ENCOUNTER — Ambulatory Visit: Payer: 59 | Admitting: Obstetrics and Gynecology

## 2021-07-02 VITALS — BP 146/96 | HR 73 | Ht 65.0 in | Wt 224.8 lb

## 2021-07-02 DIAGNOSIS — Z30431 Encounter for routine checking of intrauterine contraceptive device: Secondary | ICD-10-CM

## 2021-07-02 NOTE — Progress Notes (Signed)
HPI: ?     [name] is a 51 y.o. [phone] who LMP was No LMP recorded (lmp unknown). Patient is perimenopausal. ? ?Subjective:  ? ?She presents today for follow-up of her IUD and to discuss her ERT.  She is not taking estradiol and not Activella.  She reports no issues from her IUD.  She states that she still has occasional spotting but it is minimal.  She has no issues with the strings.  She states on the estrogen with the IUD she feels better than she has in a long time.  She is very happy about her current treatment.  She has stopped her Paxil. ?She recently got married.  Congratulations!!!!!! ? ?  Hx: ?The following portions of the patient's history were reviewed and updated as appropriate: ?            She  has a past medical history of Anemia, Chicken pox, Depression, GERD (gastroesophageal reflux disease), Iron deficiency anemia (06/17/2018), Jaundice due to hepatitis, and Restless leg syndrome. ?She does not have any pertinent problems on file. ?She  has a past surgical history that includes Cholecystectomy (2003); Cesarean section [phone]); Colonoscopy with propofol (N/A, 08/22/2019); Esophagogastroduodenoscopy (egd) with propofol (N/A, 08/22/2019); Hernia repair; and Hysteroscopy with D & C (N/A, 08/28/2020). ?Her family history includes Alcohol abuse in her father; Arthritis in her father and maternal grandmother; Breast cancer (age of onset: 38) in her paternal aunt; COPD in her father; Early death in her mother; Heart disease in her mother; Hyperlipidemia in her father, maternal grandfather, and mother; Hypertension in her father and mother; Kidney disease in her father; Stroke in her maternal grandmother. ?She  reports that she has quit smoking. She has never used smokeless tobacco. She reports current alcohol use of about 7.0 standard drinks per week. She reports that she does not use drugs. ?She has a current medication list which includes the following prescription(s): acetaminophen,  clobetasol ointment, estradiol, integra, gabapentin, magnesium oxide, metoprolol tartrate, omeprazole, and polyethyl glycol-propyl glycol. ?She is allergic to hydrocodone. ?      ?Review of Systems:  ?Review of Systems ? ?Constitutional: Denied constitutional symptoms, night sweats, recent illness, fatigue, fever, insomnia and weight loss.  ?Eyes: Denied eye symptoms, eye pain, photophobia, vision change and visual disturbance.  ?Ears/Nose/Throat/Neck: Denied ear, nose, throat or neck symptoms, hearing loss, nasal discharge, sinus congestion and sore throat.  ?Cardiovascular: Denied cardiovascular symptoms, arrhythmia, chest pain/pressure, edema, exercise intolerance, orthopnea and palpitations.  ?Respiratory: Denied pulmonary symptoms, asthma, pleuritic pain, productive sputum, cough, dyspnea and wheezing.  ?Gastrointestinal: Denied, gastro-esophageal reflux, melena, nausea and vomiting.  ?Genitourinary: Denied genitourinary symptoms including symptomatic vaginal discharge, pelvic relaxation issues, and urinary complaints.  ?Musculoskeletal: Denied musculoskeletal symptoms, stiffness, swelling, muscle weakness and myalgia.  ?Dermatologic: Denied dermatology symptoms, rash and scar.  ?Neurologic: Denied neurology symptoms, dizziness, headache, neck pain and syncope.  ?Psychiatric: Denied psychiatric symptoms, anxiety and depression.  ?Endocrine: Denied endocrine symptoms including hot flashes and night sweats.  ? ?Meds: ?  ?Current Outpatient Medications on File Prior to Visit  ?Medication Sig Dispense Refill  ? acetaminophen (TYLENOL) 500 MG tablet Take 1 tablet (500 mg total) by mouth every 6 (six) hours. 60 tablet 0  ? clobetasol ointment (TEMOVATE) 0.05 % Apply to affected area twice a day for 4 weeks, then every night for 4 weeks and then twice a week for 4 weeks or until resolution. 30 g 5  ? estradiol (ESTRACE) 1 MG tablet Take 1 mg by mouth  daily.    ? Fe Fum-FePoly-Vit C-Vit B3 (INTEGRA) 62.5-62.5-40-3 MG  CAPS Take 1 capsule by mouth daily. 90 capsule 1  ? gabapentin (NEURONTIN) 100 MG capsule Take 1 capsule (100 mg total) by mouth at bedtime. 30 capsule 2  ? magnesium oxide (MAG-OX) 400 (240 Mg) MG tablet Take 1 tablet (400 mg total) by mouth daily. 30 tablet 2  ? metoprolol tartrate (LOPRESSOR) 25 MG tablet Take 1 tablet (25 mg total) by mouth 2 (two) times daily. 60 tablet 2  ? omeprazole (PRILOSEC) 20 MG capsule Take 1 capsule (20 mg total) by mouth daily. 90 capsule 1  ? Polyethyl Glycol-Propyl Glycol (SYSTANE ULTRA OP) Place 1 drop into both eyes every 6 (six) hours as needed (dry eyes).    ? ?No current facility-administered medications on file prior to visit.  ? ? ? ? ?Objective:  ?  ? ?There were no vitals filed for this visit. ?There were no vitals filed for this visit. ?  ?         Physical examination ?  Pelvic:   ?Vulva: Normal appearance.  No lesions.  ?Vagina: No lesions or abnormalities noted.  ?Support: Normal pelvic support.  ?Urethra No masses tenderness or scarring.  ?Meatus Normal size without lesions or prolapse.  ?Cervix: Normal appearance.  No lesions. IUD strings noted at cervical os.  ?Anus: Normal exam.  No lesions.  ?Perineum: Normal exam.  No lesions.  ?      Bimanual   ?Uterus: Normal size.  Non-tender.  Mobile.  AV.  ?Adnexae: No masses.  Non-tender to palpation.  ?Cul-de-sac: Negative for abnormality.  ? ? ?        ? ?Assessment:  ?  ?[id number] ?Patient Active Problem List  ? Diagnosis Date Noted  ? Essential hypertension, benign 03/24/2021  ? Leukocytosis 03/19/2021  ? Headache 03/19/2021  ? Abnormal uterine bleeding   ? Postmenopausal bleeding   ? Hiatal hernia 06/20/2020  ? Stress 02/19/2020  ? Dysphagia   ? Barrett's esophagus without dysplasia   ? Stomach irritation   ? Weight gain 07/17/2019  ? Varicose veins of leg with pain, bilateral 07/17/2019  ? GERD (gastroesophageal reflux disease) 06/05/2019  ? Menstrual changes 06/05/2019  ? Encounter for screening colonoscopy 06/05/2019   ? Sweating increase 06/05/2019  ? Leiomyoma 06/29/2018  ? Iron deficiency anemia 06/17/2018  ? Restless leg 05/26/2018  ? ?  ?1. Encounter for routine checking of intrauterine contraceptive device (IUD)   ? ? Patient doing very well with IUD and ERT.  She is quite happy on this regimen. ? ? ?Plan:  ? ?       ? 1.  Follow-up for annual examination. ?Orders ?No orders of the defined types were placed in this encounter. ? ? No orders of the defined types were placed in this encounter. ?  ?  F/U ? Return for Annual Physical. ?I spent 15 minutes involved in the care of this patient preparing to see the patient by obtaining and reviewing her medical history (including labs, imaging tests and prior procedures), documenting clinical information in the electronic health record (EHR), counseling and coordinating care plans, writing and sending prescriptions, ordering tests or procedures and in direct communicating with the patient and medical staff discussing pertinent items from her history and physical exam. ? ?Finis Bud, M.D. ?07/02/2021 ?3:29 PM ? ? ? ? ?

## 2021-07-02 NOTE — Progress Notes (Signed)
Patient presents today for IUD string check. She states she is still spotting lightly. Patient states no discomfort or pain. Patient states no other questions or concerns.  ?

## 2021-07-07 ENCOUNTER — Other Ambulatory Visit: Payer: Self-pay | Admitting: Internal Medicine

## 2021-07-16 ENCOUNTER — Encounter: Payer: Self-pay | Admitting: Podiatry

## 2021-07-17 ENCOUNTER — Encounter: Payer: Self-pay | Admitting: Internal Medicine

## 2021-07-29 ENCOUNTER — Ambulatory Visit: Payer: 59 | Admitting: Internal Medicine

## 2021-07-30 ENCOUNTER — Encounter: Payer: Self-pay | Admitting: Podiatry

## 2021-08-20 ENCOUNTER — Ambulatory Visit (INDEPENDENT_AMBULATORY_CARE_PROVIDER_SITE_OTHER): Payer: 59 | Admitting: Obstetrics and Gynecology

## 2021-08-20 ENCOUNTER — Encounter: Payer: Self-pay | Admitting: Obstetrics and Gynecology

## 2021-08-20 ENCOUNTER — Other Ambulatory Visit (HOSPITAL_COMMUNITY)
Admission: RE | Admit: 2021-08-20 | Discharge: 2021-08-20 | Disposition: A | Discharge status: Home / Self Care | Payer: 59 | Source: Ambulatory Visit | Attending: Obstetrics and Gynecology | Admitting: Obstetrics and Gynecology

## 2021-08-20 VITALS — BP 132/93 | HR 78 | Ht 65.0 in | Wt 213.3 lb

## 2021-08-20 DIAGNOSIS — Z01419 Encounter for gynecological examination (general) (routine) without abnormal findings: Secondary | ICD-10-CM | POA: Diagnosis present

## 2021-08-20 DIAGNOSIS — N921 Excessive and frequent menstruation with irregular cycle: Secondary | ICD-10-CM | POA: Diagnosis not present

## 2021-08-20 DIAGNOSIS — Z1231 Encounter for screening mammogram for malignant neoplasm of breast: Secondary | ICD-10-CM | POA: Diagnosis not present

## 2021-08-20 DIAGNOSIS — Z975 Presence of (intrauterine) contraceptive device: Secondary | ICD-10-CM

## 2021-08-20 DIAGNOSIS — Z124 Encounter for screening for malignant neoplasm of cervix: Secondary | ICD-10-CM | POA: Insufficient documentation

## 2021-08-20 DIAGNOSIS — N951 Menopausal and female climacteric states: Secondary | ICD-10-CM

## 2021-08-20 MED ORDER — ESTRADIOL 1 MG PO TABS: 1.5000 mg | ORAL_TABLET | Freq: Every day | ORAL | 2 refills | Status: DC

## 2021-08-20 NOTE — Progress Notes (Signed)
HPI: ?     [name] is a 51 y.o. [phone] who LMP was No LMP recorded. (Menstrual status: IUD). ? ?Subjective:  ? ?She presents today for her annual examination.  She states that she generally likes her IUD but has had almost daily spotting since insertion.  She also feels better on ERT and has eliminated all of her daily hot flashes.  She does state that she continues to experience nightly hot flashes and these have been annoying for her.  She would like something to take these away if possible. ? ?  Hx: ?The following portions of the patient's history were reviewed and updated as appropriate: ?            She  has a past medical history of Anemia, Chicken pox, Depression, GERD (gastroesophageal reflux disease), Iron deficiency anemia (06/17/2018), Jaundice due to hepatitis, and Restless leg syndrome. ?She does not have any pertinent problems on file. ?She  has a past surgical history that includes Cholecystectomy (2003); Cesarean section [phone]); Colonoscopy with propofol (N/A, 08/22/2019); Esophagogastroduodenoscopy (egd) with propofol (N/A, 08/22/2019); Hernia repair; and Hysteroscopy with D & C (N/A, 08/28/2020). ?Her family history includes Alcohol abuse in her father; Arthritis in her father and maternal grandmother; Breast cancer (age of onset: 41) in her paternal aunt; COPD in her father; Early death in her mother; Heart disease in her mother; Hyperlipidemia in her father, maternal grandfather, and mother; Hypertension in her father and mother; Kidney disease in her father; Stroke in her maternal grandmother. ?She  reports that she has quit smoking. She has never used smokeless tobacco. She reports current alcohol use of about 7.0 standard drinks per week. She reports that she does not use drugs. ?She has a current medication list which includes the following prescription(s): acetaminophen, estradiol, integra, gabapentin, magnesium oxide, metoprolol tartrate, omeprazole, and polyethyl  glycol-propyl glycol. ?She is allergic to hydrocodone. ?      ?Review of Systems:  ?Review of Systems ? ?Constitutional: Denied constitutional symptoms, night sweats, recent illness, fatigue, fever, insomnia and weight loss.  ?Eyes: Denied eye symptoms, eye pain, photophobia, vision change and visual disturbance.  ?Ears/Nose/Throat/Neck: Denied ear, nose, throat or neck symptoms, hearing loss, nasal discharge, sinus congestion and sore throat.  ?Cardiovascular: Denied cardiovascular symptoms, arrhythmia, chest pain/pressure, edema, exercise intolerance, orthopnea and palpitations.  ?Respiratory: Denied pulmonary symptoms, asthma, pleuritic pain, productive sputum, cough, dyspnea and wheezing.  ?Gastrointestinal: Denied, gastro-esophageal reflux, melena, nausea and vomiting.  ?Genitourinary: Denied genitourinary symptoms including symptomatic vaginal discharge, pelvic relaxation issues, and urinary complaints.  ?Musculoskeletal: Denied musculoskeletal symptoms, stiffness, swelling, muscle weakness and myalgia.  ?Dermatologic: Denied dermatology symptoms, rash and scar.  ?Neurologic: Denied neurology symptoms, dizziness, headache, neck pain and syncope.  ?Psychiatric: Denied psychiatric symptoms, anxiety and depression.  ?Endocrine: See HPI for additional information.  ? ?Meds: ?  ?Current Outpatient Medications on File Prior to Visit  ?Medication Sig Dispense Refill  ? acetaminophen (TYLENOL) 500 MG tablet Take 1 tablet (500 mg total) by mouth every 6 (six) hours. 60 tablet 0  ? Fe Fum-FePoly-Vit C-Vit B3 (INTEGRA) 62.5-62.5-40-3 MG CAPS Take 1 capsule by mouth daily. 90 capsule 1  ? gabapentin (NEURONTIN) 100 MG capsule Take 1 capsule (100 mg total) by mouth at bedtime. 30 capsule 2  ? magnesium oxide (MAG-OX) 400 (240 Mg) MG tablet Take 1 tablet (400 mg total) by mouth daily. 30 tablet 2  ? metoprolol tartrate (LOPRESSOR) 25 MG tablet TAKE ONE TABLET BY MOUTH TWICE A DAY 60  tablet 2  ? omeprazole (PRILOSEC) 20 MG  capsule Take 1 capsule (20 mg total) by mouth daily. 90 capsule 1  ? Polyethyl Glycol-Propyl Glycol (SYSTANE ULTRA OP) Place 1 drop into both eyes every 6 (six) hours as needed (dry eyes).    ? ?No current facility-administered medications on file prior to visit.  ? ? ? ?Objective:  ?  ? ?Vitals:  ? 08/20/21 1336  ?BP: (!) 132/93  ?Pulse: 78  ?  ?Filed Weights  ? 08/20/21 1336  ?Weight: 213 lb 4.8 oz (96.8 kg)  ? ?  ?         Physical examination ?General NAD, Conversant  ?HEENT Atraumatic; Op clear with mmm.  Normo-cephalic. Pupils reactive. Anicteric sclerae  ?Thyroid/Neck Smooth without nodularity or enlargement. Normal ROM.  Neck Supple.  ?Skin No rashes, lesions or ulceration. Normal palpated skin turgor. No nodularity.  ?Breasts: No masses or discharge.  Symmetric.  No axillary adenopathy.  ?Lungs: Clear to auscultation.No rales or wheezes. Normal Respiratory effort, no retractions.  ?Heart: NSR.  No murmurs or rubs appreciated. No periferal edema  ?Abdomen: Soft.  Non-tender.  No masses.  No HSM. No hernia  ?Extremities: Moves all appropriately.  Normal ROM for age. No lymphadenopathy.  ?Neuro: Oriented to PPT.  Normal mood. Normal affect.  ? ?  Pelvic:   ?Vulva: Normal appearance.  No lesions.  ?Vagina: No lesions or abnormalities noted.  ?Support: Normal pelvic support.  ?Urethra No masses tenderness or scarring.  ?Meatus Normal size without lesions or prolapse.  ?Cervix: Normal appearance.  No lesions.  Strings appropriate length.  ?Anus: Normal exam.  No lesions.  ?Perineum: Normal exam.  No lesions.  ?      Bimanual   ?Uterus: Normal size.  Non-tender.  Mobile.  AV.  ?Adnexae: No masses.  Non-tender to palpation.  ?Cul-de-sac: Negative for abnormality.  ? ? ? ?Assessment:  ?  ?[id number] ?Patient Active Problem List  ? Diagnosis Date Noted  ? Essential hypertension, benign 03/24/2021  ? Leukocytosis 03/19/2021  ? Headache 03/19/2021  ? Abnormal uterine bleeding   ? Postmenopausal bleeding   ? Hiatal hernia  06/20/2020  ? Stress 02/19/2020  ? Dysphagia   ? Barrett's esophagus without dysplasia   ? Stomach irritation   ? Weight gain 07/17/2019  ? Varicose veins of leg with pain, bilateral 07/17/2019  ? GERD (gastroesophageal reflux disease) 06/05/2019  ? Menstrual changes 06/05/2019  ? Encounter for screening colonoscopy 06/05/2019  ? Sweating increase 06/05/2019  ? Leiomyoma 06/29/2018  ? Iron deficiency anemia 06/17/2018  ? Restless leg 05/26/2018  ? ?  ?1. Well woman exam with routine gynecological exam   ?2. Cervical cancer screening   ?3. Screening mammogram for breast cancer   ?4. Breakthrough bleeding with IUD   ?5. Symptomatic menopausal or female climacteric states   ? ? Patient continues to have some night sweats but daily hot flashes are resolved. ? Spotting with IUD. ? ? ?Plan:  ?  ?       ? 1.  Basic Screening Recommendations ?The basic screening recommendations for asymptomatic women were discussed with the patient during her visit.  The age-appropriate recommendations were discussed with her and the rational for the tests reviewed.  When I am informed by the patient that another primary care physician has previously obtained the age-appropriate tests and they are up-to-date, only outstanding tests are ordered and referrals given as necessary.  Abnormal results of tests will be discussed with her when all  of her results are completed.  Routine preventative health maintenance measures emphasized: Exercise/Diet/Weight control, Tobacco Warnings, Alcohol/Substance use risks and Stress Management ?Mammogram ordered ?2.  1 pack of OCPs to stop spotting from IUD.  If this does not work patient to inform us. ?3.  Increase ERT to 1.5 mg daily to try to eliminate night sweats. ? ?Orders ?Orders Placed This Encounter  ?Procedures  ? MM DIGITAL SCREENING BILATERAL  ? ?  ?Meds ordered this encounter  ?Medications  ? estradiol (ESTRACE) 1 MG tablet  ?  Sig: Take 1.5 tablets (1.5 mg total) by mouth daily.  ?  Dispense:   45 tablet  ?  Refill:  2  ?    ?  ?  F/U ? Return in about 1 year (around 08/21/2022) for Pt to contact us if symptoms worsen. ? ?Finis Bud, M.D. ?08/20/2021 ?2:24 PM ? ? ? ?

## 2021-08-20 NOTE — Progress Notes (Signed)
Patients presents for annual exam today. She states her hot flashes have gotten better, will still have them at night waking her up in a panic. She reports daily bleeding since IUD insertion (2/15) that is light but dark red in color. Patient is due for pap smear, ordered. Patient is due for mammogram, ordered. Patients annual labs deferred to PCP. Patient states no other questions or concerns at this time.   ?

## 2021-08-22 LAB — CYTOLOGY - PAP
Comment: NEGATIVE
Diagnosis: NEGATIVE
High risk HPV: NEGATIVE

## 2021-08-27 ENCOUNTER — Ambulatory Visit (INDEPENDENT_AMBULATORY_CARE_PROVIDER_SITE_OTHER): Payer: 59 | Admitting: Internal Medicine

## 2021-08-27 ENCOUNTER — Encounter: Payer: Self-pay | Admitting: Internal Medicine

## 2021-08-27 DIAGNOSIS — R519 Headache, unspecified: Secondary | ICD-10-CM

## 2021-08-27 DIAGNOSIS — N939 Abnormal uterine and vaginal bleeding, unspecified: Secondary | ICD-10-CM | POA: Diagnosis not present

## 2021-08-27 DIAGNOSIS — F439 Reaction to severe stress, unspecified: Secondary | ICD-10-CM

## 2021-08-27 DIAGNOSIS — R059 Cough, unspecified: Secondary | ICD-10-CM

## 2021-08-27 DIAGNOSIS — I10 Essential (primary) hypertension: Secondary | ICD-10-CM | POA: Diagnosis not present

## 2021-08-27 DIAGNOSIS — D509 Iron deficiency anemia, unspecified: Secondary | ICD-10-CM

## 2021-08-27 DIAGNOSIS — K219 Gastro-esophageal reflux disease without esophagitis: Secondary | ICD-10-CM

## 2021-08-27 NOTE — Patient Instructions (Signed)
Saline nasal spray - flush nose 2x/day  Nasacort nasal spray - 2 sprays each nostril one time per day.  Do this in the evening.   Robitussin DM twice a day as needed for cough and congestion.   

## 2021-08-27 NOTE — Progress Notes (Signed)
Patient ID: Brianna [name], female   DOB: 1970/10/29, 51 y.o.   MRN: [id number] ? ? ?Subjective:  ? ? Patient ID: Brianna [name], female    DOB: 14-Jul-1970, 51 y.o.   MRN: [id number] ? ?This visit occurred during the SARS-CoV-2 public health emergency.  Safety protocols were in place, including screening questions prior to the visit, additional usage of staff PPE, and extensive cleaning of exam room while observing appropriate contact time as indicated for disinfecting solutions.  ? ?Patient here for a scheduled follow up.  ? ?Chief Complaint  ?Patient presents with  ? Follow-up  ?  6wk follow up  ? .  ? ?HPI ?Here to follow up regarding her blood pressure, stress, headaches and sleep issues.  Reports gabapentin is working for her.  Discussed taking two capsules per night.  Has IUD.  Just saw gyn.  Was placed on OCPs to stop spotting.  Also ERT increased to 1.'5mg'$  daily to eliminate night sweats.  Is doing better.  No chest pain.  Breathing stable.  Does report Sunday - headache/pressure.  Sore throat.  Took dayquil.  Slept.  Some nausea yesterday.  No diarrhea.  Scratchy throat.  Some cough.  Husband has been sick.  Took one of his abx.  Felt better.  Having headaches.  Was taking magnesium.  Out.  Needs f/u with neurology.  Has not been arranged.   ? ? ?Past Medical History:  ?Diagnosis Date  ? Anemia   ? Chicken pox   ? Depression   ? GERD (gastroesophageal reflux disease)   ? Iron deficiency anemia 06/17/2018  ? Jaundice due to hepatitis   ? Restless leg syndrome   ? ?Past Surgical History:  ?Procedure Laterality Date  ? CESAREAN SECTION  [phone]  ? CHOLECYSTECTOMY  2003  ? COLONOSCOPY WITH PROPOFOL N/A 08/22/2019  ? Procedure: COLONOSCOPY WITH PROPOFOL;  Surgeon: Virgel Manifold, MD;  Location: [facility];  Service: Endoscopy;  Laterality: N/A;  ? ESOPHAGOGASTRODUODENOSCOPY (EGD) WITH PROPOFOL N/A 08/22/2019  ? Procedure: ESOPHAGOGASTRODUODENOSCOPY (EGD) WITH PROPOFOL;  Surgeon: Virgel Manifold, MD;   Location: [facility];  Service: Endoscopy;  Laterality: N/A;  ? HERNIA REPAIR    ? HYSTEROSCOPY WITH D & C N/A 08/28/2020  ? Procedure: DILATATION AND CURETTAGE /HYSTEROSCOPY;  Surgeon: Homero Fellers, MD;  Location: [facility];  Service: Gynecology;  Laterality: N/A;  ? ?Family History  ?Problem Relation Age of Onset  ? Early death Mother   ? Heart disease Mother   ? Hyperlipidemia Mother   ? Hypertension Mother   ? Alcohol abuse Father   ? Arthritis Father   ? COPD Father   ? Hyperlipidemia Father   ? Hypertension Father   ? Kidney disease Father   ? Arthritis Maternal Grandmother   ? Stroke Maternal Grandmother   ? Hyperlipidemia Maternal Grandfather   ? Breast cancer Paternal Aunt 27  ? ?Social History  ? ?Socioeconomic History  ? Marital status: Married  ?  Spouse name: Not on file  ? Number of children: Not on file  ? Years of education: Not on file  ? Highest education level: Not on file  ?Occupational History  ? Not on file  ?Tobacco Use  ? Smoking status: Former  ? Smokeless tobacco: Never  ?Vaping Use  ? Vaping Use: Never used  ?Substance and Sexual Activity  ? Alcohol use: Yes  ?  Alcohol/week: 7.0 standard drinks  ?  Types: 7 Glasses of wine per week  ?  Comment:  occasional   ? Drug use: Never  ? Sexual activity: Yes  ?  Birth control/protection: I.U.D., Post-menopausal  ?Other Topics Concern  ? Not on file  ?Social History Narrative  ? Not on file  ? ?Social Determinants of Health  ? ?Financial Resource Strain: Low Risk   ? Difficulty of Paying Living Expenses: Not hard at all  ?Food Insecurity: Not on file  ?Transportation Needs: Not on file  ?Physical Activity: Not on file  ?Stress: Not on file  ?Social Connections: Not on file  ? ? ? ?Review of Systems  ?Constitutional:  Negative for appetite change, fever and unexpected weight change.  ?HENT:  Positive for congestion and sore throat.   ?Respiratory:  Positive for cough. Negative for chest tightness and shortness of breath.   ?Cardiovascular:   Negative for chest pain and palpitations.  ?Gastrointestinal:  Positive for nausea. Negative for abdominal pain, diarrhea and vomiting.  ?Genitourinary:  Negative for difficulty urinating and dysuria.  ?Musculoskeletal:  Negative for joint swelling and myalgias.  ?Skin:  Negative for color change and rash.  ?Neurological:  Positive for headaches. Negative for dizziness and light-headedness.  ?Psychiatric/Behavioral:  Negative for agitation and dysphoric mood.   ? ?   ?Objective:  ?  ? ?BP 128/80 (BP Location: Left Arm, Patient Position: Sitting, Cuff Size: Large)   Pulse 64   Temp 98.2 ?F (36.8 ?C) (Temporal)   Resp 15   Ht '5\' 5"'$  (1.651 m)   Wt 217 lb 3.2 oz (98.5 kg)   SpO2 98%   BMI 36.14 kg/m?  ?Wt Readings from Last 3 Encounters:  ?08/27/21 217 lb 3.2 oz (98.5 kg)  ?08/20/21 213 lb 4.8 oz (96.8 kg)  ?07/02/21 224 lb 12.8 oz (102 kg)  ? ? ?Physical Exam ?Vitals reviewed.  ?Constitutional:   ?   General: She is not in acute distress. ?   Appearance: Normal appearance.  ?HENT:  ?   Head: Normocephalic and atraumatic.  ?   Right Ear: External ear normal.  ?   Left Ear: External ear normal.  ?Eyes:  ?   General: No scleral icterus.    ?   Right eye: No discharge.     ?   Left eye: No discharge.  ?   Conjunctiva/sclera: Conjunctivae normal.  ?Neck:  ?   Thyroid: No thyromegaly.  ?Cardiovascular:  ?   Rate and Rhythm: Normal rate and regular rhythm.  ?Pulmonary:  ?   Effort: No respiratory distress.  ?   Breath sounds: Normal breath sounds. No wheezing.  ?Abdominal:  ?   General: Bowel sounds are normal.  ?   Palpations: Abdomen is soft.  ?   Tenderness: There is no abdominal tenderness.  ?Musculoskeletal:     ?   General: No swelling or tenderness.  ?   Cervical back: Neck supple. No tenderness.  ?Lymphadenopathy:  ?   Cervical: No cervical adenopathy.  ?Skin: ?   Findings: No erythema or rash.  ?Neurological:  ?   Mental Status: She is alert.  ?Psychiatric:     ?   Mood and Affect: Mood normal.     ?    Behavior: Behavior normal.  ? ? ? ?Outpatient Encounter Medications as of 08/27/2021  ?Medication Sig  ? acetaminophen (TYLENOL) 500 MG tablet Take 1 tablet (500 mg total) by mouth every 6 (six) hours.  ? butalbital-acetaminophen-caffeine (FIORICET) 50-325-40 MG tablet Take 1 tablet by mouth 2 (two) times daily as needed for headache.  ?  estradiol (ESTRACE) 1 MG tablet Take 1.5 tablets (1.5 mg total) by mouth daily.  ? Fe Fum-FePoly-Vit C-Vit B3 (INTEGRA) 62.5-62.5-40-3 MG CAPS Take 1 capsule by mouth daily.  ? magnesium oxide (MAG-OX) 400 (240 Mg) MG tablet Take 1 tablet (400 mg total) by mouth daily.  ? metoprolol tartrate (LOPRESSOR) 25 MG tablet TAKE ONE TABLET BY MOUTH TWICE A DAY  ? omeprazole (PRILOSEC) 20 MG capsule Take 1 capsule (20 mg total) by mouth daily.  ? Polyethyl Glycol-Propyl Glycol (SYSTANE ULTRA OP) Place 1 drop into both eyes every 6 (six) hours as needed (dry eyes).  ? [DISCONTINUED] gabapentin (NEURONTIN) 100 MG capsule Take 1 capsule (100 mg total) by mouth at bedtime.  ? gabapentin (NEURONTIN) 100 MG capsule Take 2 capsules q hs  ? ?No facility-administered encounter medications on file as of 08/27/2021.  ?  ? ?Lab Results  ?Component Value Date  ? WBC 6.8 04/29/2021  ? HGB 14.4 04/29/2021  ? HCT 44.0 04/29/2021  ? PLT 264.0 04/29/2021  ? GLUCOSE 94 04/17/2021  ? CHOL 193 03/19/2021  ? TRIG 156.0 (H) 03/19/2021  ? HDL 51.30 03/19/2021  ? LDLCALC 111 (H) 03/19/2021  ? ALT 8 03/19/2021  ? AST 12 03/19/2021  ? NA 136 04/17/2021  ? K 4.7 04/17/2021  ? CL 104 04/17/2021  ? CREATININE 0.72 04/17/2021  ? BUN 8 04/17/2021  ? CO2 26 04/17/2021  ? TSH 3.08 03/19/2021  ? ? ?   ?Assessment & Plan:  ? ?Problem List Items Addressed This Visit   ? ? Abnormal uterine bleeding  ?  Saw gyn 08/20/21 Mammogram ordered. 1 pack of OCPs to stop spotting from IUD.  If this does not work patient to inform us.  Increase ERT to 1.5 mg daily to try to eliminate night sweats. ?  ?  ? Cough  ?  Congestion, scratchy throat and  cough as outlined.  Saline nasal spray, steroid nasal spray and robitussin/mucinex.  Hold abx.  Follow closely.  Nasal swab for RSV, flu and covid.  Call with update.  ? ?  ?  ? Relevant Orders  ? COVID-19, Flu A+B a

## 2021-08-28 ENCOUNTER — Encounter: Payer: Self-pay | Admitting: Internal Medicine

## 2021-08-28 MED ORDER — GABAPENTIN 100 MG PO CAPS: ORAL_CAPSULE | ORAL | 2 refills | Status: DC

## 2021-08-28 MED ORDER — BUTALBITAL-APAP-CAFFEINE 50-325-40 MG PO TABS: 1.0000 | ORAL_TABLET | Freq: Two times a day (BID) | ORAL | 0 refills | Status: AC | PRN

## 2021-08-28 NOTE — Telephone Encounter (Signed)
Rx sent in for fioricet #20 with no refills and gabepentin 2 q hs #60 with no refills. Pt aware.  ?

## 2021-08-29 LAB — COVID-19, FLU A+B AND RSV
Influenza A, NAA: NOT DETECTED
Influenza B, NAA: NOT DETECTED
RSV, NAA: NOT DETECTED
SARS-CoV-2, NAA: NOT DETECTED

## 2021-08-31 ENCOUNTER — Other Ambulatory Visit: Payer: Self-pay | Admitting: Obstetrics and Gynecology

## 2021-08-31 DIAGNOSIS — N951 Menopausal and female climacteric states: Secondary | ICD-10-CM

## 2021-09-01 ENCOUNTER — Encounter: Payer: Self-pay | Admitting: Internal Medicine

## 2021-09-01 ENCOUNTER — Telehealth: Payer: Self-pay | Admitting: Internal Medicine

## 2021-09-01 NOTE — Assessment & Plan Note (Signed)
Congestion, scratchy throat and cough as outlined.  Saline nasal spray, steroid nasal spray and robitussin/mucinex.  Hold abx.  Follow closely.  Nasal swab for RSV, flu and covid.  Call with update.  ?

## 2021-09-01 NOTE — Assessment & Plan Note (Signed)
On gabapentin.  Sleeping better.  rx for magnesium.  Confirm f/u with neurology.   ?

## 2021-09-01 NOTE — Assessment & Plan Note (Signed)
Off paxil.  Continue gabapentin.  Will change rx to 2 q hs.  Follow.   ?

## 2021-09-01 NOTE — Telephone Encounter (Signed)
I placed order for referral to neurology in 06/2021.  She states she has not heard about an appt.  Can you help with this?  Thanks   ?

## 2021-09-01 NOTE — Assessment & Plan Note (Signed)
Saw gyn 08/20/21 Mammogram ordered. 1 pack of OCPs to stop spotting from IUD.  If this does not work patient to inform us.  Increase ERT to 1.5 mg daily to try to eliminate night sweats. ?

## 2021-09-01 NOTE — Assessment & Plan Note (Signed)
Controlled on prilosec.   

## 2021-09-01 NOTE — Assessment & Plan Note (Signed)
Has seen hematology previously.  Follow cbc and iron studies.  Vaginal bleeding better.  ?

## 2021-09-01 NOTE — Assessment & Plan Note (Signed)
Blood pressure as outlined.  On metoprolol '25mg'$  bid.  Follow pressures.  Hold on making changes in medication.  Follow  ?

## 2021-09-05 ENCOUNTER — Other Ambulatory Visit: Payer: Self-pay | Admitting: Internal Medicine

## 2021-09-06 ENCOUNTER — Telehealth: Payer: Self-pay

## 2021-09-06 ENCOUNTER — Other Ambulatory Visit: Payer: Self-pay | Admitting: Internal Medicine

## 2021-09-06 MED ORDER — PREDNISONE 10 MG PO TABS: ORAL_TABLET | ORAL | 0 refills | Status: DC

## 2021-09-06 MED ORDER — CEFDINIR 300 MG PO CAPS: 300.0000 mg | ORAL_CAPSULE | Freq: Two times a day (BID) | ORAL | 0 refills | Status: DC

## 2021-09-06 NOTE — Progress Notes (Unsigned)
Rx sent in for omnicef and prednisone.  See phone note.

## 2021-09-06 NOTE — Telephone Encounter (Signed)
I spoke with patient to see how she was doing  & she stated that she feels awful. Her cough is hanging on, she has facial pressure especially where her glasses sit & she also has some ear pain yesterday. She is unsure what to do & if she needs an antibiotic?

## 2021-09-07 NOTE — Telephone Encounter (Signed)
Late entry. Called pt and discussed currently symptoms.  With increased sinus pressure and head fullness.  Persistent cough and occasional coughing fits.  Present now for two weeks.  No chest pain or sob.  Will treat with omnicef bid x 10 days and prednisone taper.  Robitussin DM.  Prescription meds sent to pharmacy.  Call with update.

## 2021-09-17 NOTE — Telephone Encounter (Signed)
Please notify pt that neurology is going to call her to make an appt.

## 2021-09-18 NOTE — Telephone Encounter (Signed)
Lm for pt to cb.

## 2021-10-02 ENCOUNTER — Encounter: Payer: Self-pay | Admitting: Internal Medicine

## 2021-10-02 ENCOUNTER — Other Ambulatory Visit: Payer: Self-pay

## 2021-10-03 ENCOUNTER — Other Ambulatory Visit: Payer: Self-pay

## 2021-10-03 MED ORDER — METOPROLOL TARTRATE 25 MG PO TABS: 25.0000 mg | ORAL_TABLET | Freq: Two times a day (BID) | ORAL | 2 refills | Status: DC

## 2021-10-05 ENCOUNTER — Other Ambulatory Visit: Payer: Self-pay | Admitting: Internal Medicine

## 2021-10-18 ENCOUNTER — Telehealth (INDEPENDENT_AMBULATORY_CARE_PROVIDER_SITE_OTHER): Payer: 59 | Admitting: Internal Medicine

## 2021-10-18 DIAGNOSIS — F439 Reaction to severe stress, unspecified: Secondary | ICD-10-CM

## 2021-10-18 DIAGNOSIS — Z713 Dietary counseling and surveillance: Secondary | ICD-10-CM

## 2021-10-18 DIAGNOSIS — I10 Essential (primary) hypertension: Secondary | ICD-10-CM | POA: Diagnosis not present

## 2021-10-18 MED ORDER — PHENTERMINE HCL 37.5 MG PO CAPS: 37.5000 mg | ORAL_CAPSULE | Freq: Every day | ORAL | 0 refills | Status: DC

## 2021-10-18 NOTE — Progress Notes (Deleted)
Patient ID: Brianna [name], female   DOB: 08-Mar-1971, 51 y.o.   MRN: [id number]   Subjective:    Patient ID: Brianna [name], female    DOB: 09-12-1970, 51 y.o.   MRN: [id number]  This visit occurred during the SARS-CoV-2 public health emergency.  Safety protocols were in place, including screening questions prior to the visit, additional usage of staff PPE, and extensive cleaning of exam room while observing appropriate contact time as indicated for disinfecting solutions.   Patient here for work in [employer], materials Complaint  Patient presents with   Weight Loss   .   HPI    Past Medical History:  Diagnosis Date   Anemia    Chicken pox    Depression    GERD (gastroesophageal reflux disease)    Iron deficiency anemia 06/17/2018   Jaundice due to hepatitis    Restless leg syndrome    Past Surgical History:  Procedure Laterality Date   CESAREAN SECTION  [phone]   CHOLECYSTECTOMY  2003   COLONOSCOPY WITH PROPOFOL N/A 08/22/2019   Procedure: COLONOSCOPY WITH PROPOFOL;  Surgeon: Virgel Manifold, MD;  Location: [facility];  Service: Endoscopy;  Laterality: N/A;   ESOPHAGOGASTRODUODENOSCOPY (EGD) WITH PROPOFOL N/A 08/22/2019   Procedure: ESOPHAGOGASTRODUODENOSCOPY (EGD) WITH PROPOFOL;  Surgeon: Virgel Manifold, MD;  Location: [facility];  Service: Endoscopy;  Laterality: N/A;   HERNIA REPAIR     HYSTEROSCOPY WITH D & C N/A 08/28/2020   Procedure: DILATATION AND CURETTAGE /HYSTEROSCOPY;  Surgeon: Homero Fellers, MD;  Location: [facility];  Service: Gynecology;  Laterality: N/A;   Family History  Problem Relation Age of Onset   Early death Mother    Heart disease Mother    Hyperlipidemia Mother    Hypertension Mother    Alcohol abuse Father    Arthritis Father    COPD Father    Hyperlipidemia Father    Hypertension Father    Kidney disease Father    Arthritis Maternal Grandmother    Stroke Maternal Grandmother    Hyperlipidemia Maternal Grandfather    Breast  cancer Paternal Aunt 24   Social History   Socioeconomic History   Marital status: Married    Spouse name: Not on file   Number of children: Not on file   Years of education: Not on file   Highest education level: Not on file  Occupational History   Not on file  Tobacco Use   Smoking status: Former   Smokeless tobacco: Never  Vaping Use   Vaping Use: Never used  Substance and Sexual Activity   Alcohol use: Yes    Alcohol/week: 7.0 standard drinks of alcohol    Types: 7 Glasses of wine per week    Comment: occasional    Drug use: Never   Sexual activity: Yes    Birth control/protection: I.U.D., Post-menopausal  Other Topics Concern   Not on file  Social History Narrative   Not on file   Social Determinants of Health   Financial Resource Strain: Low Risk  (02/05/2021)   Overall Financial Resource Strain (CARDIA)    Difficulty of Paying Living Expenses: Not hard at all  Food Insecurity: No Food Insecurity (11/29/2019)   Hunger Vital Sign    Worried About Running Out of Food in the Last Year: Never true    Ran Out of Food in the Last Year: Never true  Transportation Needs: Not on file  Physical Activity: Inactive (11/29/2019)   Exercise Vital Sign  Days of Exercise per Week: 0 days    Minutes of Exercise per Session: 0 min  Stress: No Stress Concern Present (11/29/2019)   [name]    Feeling of Stress : Only a little  Social Connections: Not on file     Review of Systems     Objective:     Ht '5\' 5"'$  (1.651 m)   Wt 203 lb (92.1 kg)   BMI 33.78 kg/m  Wt Readings from Last 3 Encounters:  10/18/21 203 lb (92.1 kg)  08/27/21 217 lb 3.2 oz (98.5 kg)  08/20/21 213 lb 4.8 oz (96.8 kg)    Physical Exam   Outpatient Encounter Medications as of 10/18/2021  Medication Sig   acetaminophen (TYLENOL) 500 MG tablet Take 1 tablet (500 mg total) by mouth every 6 (six) hours.    butalbital-acetaminophen-caffeine (FIORICET) 50-325-40 MG tablet Take 1 tablet by mouth 2 (two) times daily as needed for headache.   estradiol (ESTRACE) 1 MG tablet Take 1.5 tablets (1.5 mg total) by mouth daily.   Fe Fum-FePoly-Vit C-Vit B3 (INTEGRA) 62.5-62.5-40-3 MG CAPS Take 1 capsule by mouth daily.   gabapentin (NEURONTIN) 100 MG capsule TAKE ONE CAPSULE BY MOUTH AT BEDTIME   magnesium oxide (MAG-OX) 400 (240 Mg) MG tablet Take 1 tablet (400 mg total) by mouth daily.   metoprolol tartrate (LOPRESSOR) 25 MG tablet TAKE ONE TABLET BY MOUTH TWICE A DAY   omeprazole (PRILOSEC) 20 MG capsule Take 1 capsule (20 mg total) by mouth daily.   phentermine (ADIPEX-P) 37.5 MG tablet Take 18.75-37.5 mg by mouth daily.   Polyethyl Glycol-Propyl Glycol (SYSTANE ULTRA OP) Place 1 drop into both eyes every 6 (six) hours as needed (dry eyes).   [DISCONTINUED] cefdinir (OMNICEF) 300 MG capsule Take 1 capsule (300 mg total) by mouth 2 (two) times daily.   [DISCONTINUED] predniSONE (DELTASONE) 10 MG tablet Take 4 tablets x 1 day and then decrease by 1/2 tablet per day until down to zero mg.   No facility-administered encounter medications on file as of 10/18/2021.     Lab Results  Component Value Date   WBC 6.8 04/29/2021   HGB 14.4 04/29/2021   HCT 44.0 04/29/2021   PLT 264.0 04/29/2021   GLUCOSE 94 04/17/2021   CHOL 193 03/19/2021   TRIG 156.0 (H) 03/19/2021   HDL 51.30 03/19/2021   LDLCALC 111 (H) 03/19/2021   ALT 8 03/19/2021   AST 12 03/19/2021   NA 136 04/17/2021   K 4.7 04/17/2021   CL 104 04/17/2021   CREATININE 0.72 04/17/2021   BUN 8 04/17/2021   CO2 26 04/17/2021   TSH 3.08 03/19/2021       Assessment & Plan:   Problem List Items Addressed This Visit   None    Einar Pheasant, MD

## 2021-10-27 ENCOUNTER — Encounter: Payer: Self-pay | Admitting: Internal Medicine

## 2021-10-27 DIAGNOSIS — Z713 Dietary counseling and surveillance: Secondary | ICD-10-CM | POA: Insufficient documentation

## 2021-10-27 NOTE — Progress Notes (Signed)
Patient ID: Brianna [name], female   DOB: 1970/07/20, 51 y.o.   MRN: [id number]   Virtual Visit via video Note  All issues noted in this document were discussed and addressed.  No physical exam was performed (except for noted visual exam findings with Video Visits).   I connected with [name] by a video enabled telemedicine application and verified that I am speaking with the correct person using two identifiers. Location patient: home Location provider: work Persons participating in the virtual visit: patient, provider  The limitations, risks, security and privacy concerns of performing an evaluation and management service by video and the availability of in person appointments have been discussed.  It has also been discussed with the patient that there may be a patient responsible charge related to this service. The patient expressed understanding and agreed to proceed.   Reason for visit: work in appt   HPI: Work in to discuss weight loss and phentermine.  Recently met with Leo Grosser (nurse practitioner - [place]).  Discussed weight loss.  Was started on phentermine.  Tolerating.  Has lost weight.  Discussed diet and exercise.  She has adjusted her diet.  Lost weight.  Discussed phentermine and possible side effects of medication.  No problems sleeping now.  Better.     ROS: See pertinent positives and negatives per HPI.  Past Medical History:  Diagnosis Date   Anemia    Chicken pox    Depression    GERD (gastroesophageal reflux disease)    Iron deficiency anemia 06/17/2018   Jaundice due to hepatitis    Restless leg syndrome     Past Surgical History:  Procedure Laterality Date   CESAREAN SECTION  [phone]   CHOLECYSTECTOMY  2003   COLONOSCOPY WITH PROPOFOL N/A 08/22/2019   Procedure: COLONOSCOPY WITH PROPOFOL;  Surgeon: Virgel Manifold, MD;  Location: [facility];  Service: Endoscopy;  Laterality: N/A;   ESOPHAGOGASTRODUODENOSCOPY (EGD) WITH PROPOFOL N/A  08/22/2019   Procedure: ESOPHAGOGASTRODUODENOSCOPY (EGD) WITH PROPOFOL;  Surgeon: Virgel Manifold, MD;  Location: [facility];  Service: Endoscopy;  Laterality: N/A;   HERNIA REPAIR     HYSTEROSCOPY WITH D & C N/A 08/28/2020   Procedure: DILATATION AND CURETTAGE /HYSTEROSCOPY;  Surgeon: Homero Fellers, MD;  Location: [facility];  Service: Gynecology;  Laterality: N/A;    Family History  Problem Relation Age of Onset   Early death Mother    Heart disease Mother    Hyperlipidemia Mother    Hypertension Mother    Alcohol abuse Father    Arthritis Father    COPD Father    Hyperlipidemia Father    Hypertension Father    Kidney disease Father    Arthritis Maternal Grandmother    Stroke Maternal Grandmother    Hyperlipidemia Maternal Grandfather    Breast cancer Paternal Aunt 69    SOCIAL HX: reviewed.    Current Outpatient Medications:    acetaminophen (TYLENOL) 500 MG tablet, Take 1 tablet (500 mg total) by mouth every 6 (six) hours., Disp: 60 tablet, Rfl: 0   butalbital-acetaminophen-caffeine (FIORICET) 50-325-40 MG tablet, Take 1 tablet by mouth 2 (two) times daily as needed for headache., Disp: 20 tablet, Rfl: 0   estradiol (ESTRACE) 1 MG tablet, Take 1.5 tablets (1.5 mg total) by mouth daily., Disp: 45 tablet, Rfl: 2   Fe Fum-FePoly-Vit C-Vit B3 (INTEGRA) 62.5-62.5-40-3 MG CAPS, Take 1 capsule by mouth daily., Disp: 90 capsule, Rfl: 1   gabapentin (NEURONTIN) 100 MG capsule, TAKE ONE  CAPSULE BY MOUTH AT BEDTIME, Disp: 30 capsule, Rfl: 1   magnesium oxide (MAG-OX) 400 (240 Mg) MG tablet, Take 1 tablet (400 mg total) by mouth daily., Disp: 30 tablet, Rfl: 2   metoprolol tartrate (LOPRESSOR) 25 MG tablet, TAKE ONE TABLET BY MOUTH TWICE A DAY, Disp: 60 tablet, Rfl: 2   omeprazole (PRILOSEC) 20 MG capsule, Take 1 capsule (20 mg total) by mouth daily., Disp: 90 capsule, Rfl: 1   phentermine 37.5 MG capsule, Take 1 capsule (37.5 mg total) by mouth daily., Disp: 30 capsule, Rfl:  0   Polyethyl Glycol-Propyl Glycol (SYSTANE ULTRA OP), Place 1 drop into both eyes every 6 (six) hours as needed (dry eyes)., Disp: , Rfl:   EXAM:  GENERAL: alert, oriented, appears well and in no acute distress  HEENT: atraumatic, conjunttiva clear, no obvious abnormalities on inspection of external nose and ears  NECK: normal movements of the head and neck  LUNGS: on inspection no signs of respiratory distress, breathing rate appears normal, no obvious gross SOB, gasping or wheezing  CV: no obvious cyanosis  PSYCH/NEURO: pleasant and cooperative, no obvious depression or anxiety, speech and thought processing grossly intact  ASSESSMENT AND PLAN:  Discussed the following assessment and plan:  Problem List Items Addressed This Visit     Essential hypertension, benign    On metoprolol 42m bid.  Follow pressures.  Will need to monitor closely on phentermine.        Stress    Appears to be doing relatively well.  Sleeping better.       Weight loss counseling, encounter for    Discussed diet and exercise.  She has adjusted diet.  Staying active.  Has started phentermine.  Tolerating.  Discussed possible side effects.  Will continue phentermine.  Discussed limited treatment time.  Follow pressures.         Return in about 4 weeks (around 11/15/2021) for follow up appt (375m).   I discussed the assessment and treatment plan with the patient. The patient was provided an opportunity to ask questions and all were answered. The patient agreed with the plan and demonstrated an understanding of the instructions.   The patient was advised to call back or seek an in-person evaluation if the symptoms worsen or if the condition fails to improve as anticipated.   ChEinar PheasantMD

## 2021-10-27 NOTE — Assessment & Plan Note (Signed)
Appears to be doing relatively well.  Sleeping better.

## 2021-10-27 NOTE — Assessment & Plan Note (Signed)
Discussed diet and exercise.  She has adjusted diet.  Staying active.  Has started phentermine.  Tolerating.  Discussed possible side effects.  Will continue phentermine.  Discussed limited treatment time.  Follow pressures.

## 2021-10-27 NOTE — Assessment & Plan Note (Signed)
On metoprolol '25mg'$  bid.  Follow pressures.  Will need to monitor closely on phentermine.

## 2021-11-01 ENCOUNTER — Other Ambulatory Visit: Payer: Self-pay | Admitting: Internal Medicine

## 2021-11-01 NOTE — Telephone Encounter (Signed)
Rx ok'd for gabapentin #60 with 2 refills.  9take 1-2 tablets q hs.

## 2021-11-09 ENCOUNTER — Other Ambulatory Visit: Payer: Self-pay | Admitting: Internal Medicine

## 2021-11-19 ENCOUNTER — Telehealth: Payer: Self-pay

## 2021-11-19 NOTE — Telephone Encounter (Signed)
Refill request recv'd from [facility] for buspirone '10mg'$  tab one po tid; request states it wa last filled 08/22/2021; chart med hx states in 04/2021 pt wasn't taking it.

## 2021-11-22 NOTE — Telephone Encounter (Signed)
MyChart msg sent.

## 2021-11-26 ENCOUNTER — Ambulatory Visit: Payer: 59 | Admitting: Internal Medicine

## 2021-11-26 ENCOUNTER — Telehealth: Payer: Self-pay

## 2021-11-26 NOTE — Telephone Encounter (Signed)
LVM TO CALL BACK TO OFFICE TO INQUIRE ABOUT TODAY'S  APPT

## 2021-11-27 ENCOUNTER — Other Ambulatory Visit: Payer: Self-pay

## 2021-11-27 MED ORDER — BUSPIRONE HCL 10 MG PO TABS: 10.0000 mg | ORAL_TABLET | Freq: Three times a day (TID) | ORAL | 2 refills | Status: DC

## 2021-12-27 ENCOUNTER — Other Ambulatory Visit: Payer: Self-pay | Admitting: Obstetrics and Gynecology

## 2021-12-27 ENCOUNTER — Encounter: Payer: Self-pay | Admitting: Internal Medicine

## 2021-12-27 DIAGNOSIS — N951 Menopausal and female climacteric states: Secondary | ICD-10-CM

## 2022-01-06 ENCOUNTER — Other Ambulatory Visit: Payer: Self-pay | Admitting: Internal Medicine

## 2022-01-06 DIAGNOSIS — K2270 Barrett's esophagus without dysplasia: Secondary | ICD-10-CM

## 2022-01-22 ENCOUNTER — Other Ambulatory Visit: Payer: Self-pay | Admitting: Internal Medicine

## 2022-02-07 ENCOUNTER — Encounter: Payer: Self-pay | Admitting: Obstetrics and Gynecology

## 2022-02-07 ENCOUNTER — Other Ambulatory Visit: Payer: Self-pay | Admitting: Internal Medicine

## 2022-02-10 ENCOUNTER — Other Ambulatory Visit: Payer: Self-pay

## 2022-02-10 DIAGNOSIS — N951 Menopausal and female climacteric states: Secondary | ICD-10-CM

## 2022-02-10 MED ORDER — ESTRADIOL 1 MG PO TABS: 1.5000 mg | ORAL_TABLET | Freq: Every day | ORAL | 1 refills | Status: DC

## 2022-02-17 ENCOUNTER — Encounter (INDEPENDENT_AMBULATORY_CARE_PROVIDER_SITE_OTHER): Payer: Self-pay

## 2022-02-17 ENCOUNTER — Encounter: Payer: Self-pay | Admitting: Oncology

## 2022-02-18 ENCOUNTER — Encounter: Payer: Self-pay | Admitting: Oncology

## 2022-02-25 ENCOUNTER — Other Ambulatory Visit: Payer: Self-pay

## 2022-02-25 DIAGNOSIS — F419 Anxiety disorder, unspecified: Secondary | ICD-10-CM

## 2022-03-14 ENCOUNTER — Telehealth: Payer: Self-pay | Admitting: Internal Medicine

## 2022-03-14 ENCOUNTER — Encounter: Payer: Self-pay | Admitting: Internal Medicine

## 2022-03-14 DIAGNOSIS — Z0000 Encounter for general adult medical examination without abnormal findings: Secondary | ICD-10-CM | POA: Insufficient documentation

## 2022-03-14 DIAGNOSIS — Z1231 Encounter for screening mammogram for malignant neoplasm of breast: Secondary | ICD-10-CM

## 2022-03-14 NOTE — Telephone Encounter (Signed)
I received a notification that she is overdue a mammogram.  Please schedule.

## 2022-03-17 NOTE — Telephone Encounter (Signed)
Sched for 1/17 at 1120

## 2022-03-30 ENCOUNTER — Other Ambulatory Visit: Payer: Self-pay | Admitting: Internal Medicine

## 2022-04-23 ENCOUNTER — Encounter: Payer: Self-pay | Admitting: Oncology

## 2022-04-23 ENCOUNTER — Other Ambulatory Visit: Payer: Self-pay | Admitting: Internal Medicine

## 2022-04-23 ENCOUNTER — Ambulatory Visit (INDEPENDENT_AMBULATORY_CARE_PROVIDER_SITE_OTHER): Payer: 59

## 2022-04-23 ENCOUNTER — Encounter: Payer: Self-pay | Admitting: Internal Medicine

## 2022-04-23 ENCOUNTER — Ambulatory Visit: Payer: 59 | Admitting: Internal Medicine

## 2022-04-23 VITALS — BP 130/78 | HR 64 | Temp 98.1°F | Resp 16 | Ht 65.0 in | Wt 212.6 lb

## 2022-04-23 DIAGNOSIS — G2581 Restless legs syndrome: Secondary | ICD-10-CM | POA: Diagnosis not present

## 2022-04-23 DIAGNOSIS — K219 Gastro-esophageal reflux disease without esophagitis: Secondary | ICD-10-CM

## 2022-04-23 DIAGNOSIS — M545 Low back pain, unspecified: Secondary | ICD-10-CM | POA: Insufficient documentation

## 2022-04-23 DIAGNOSIS — Z23 Encounter for immunization: Secondary | ICD-10-CM

## 2022-04-23 DIAGNOSIS — M79605 Pain in left leg: Secondary | ICD-10-CM

## 2022-04-23 DIAGNOSIS — R69 Illness, unspecified: Secondary | ICD-10-CM | POA: Diagnosis not present

## 2022-04-23 DIAGNOSIS — D509 Iron deficiency anemia, unspecified: Secondary | ICD-10-CM | POA: Diagnosis not present

## 2022-04-23 DIAGNOSIS — M79604 Pain in right leg: Secondary | ICD-10-CM | POA: Diagnosis not present

## 2022-04-23 DIAGNOSIS — G8929 Other chronic pain: Secondary | ICD-10-CM | POA: Diagnosis not present

## 2022-04-23 DIAGNOSIS — K2270 Barrett's esophagus without dysplasia: Secondary | ICD-10-CM | POA: Diagnosis not present

## 2022-04-23 DIAGNOSIS — I10 Essential (primary) hypertension: Secondary | ICD-10-CM

## 2022-04-23 DIAGNOSIS — Z713 Dietary counseling and surveillance: Secondary | ICD-10-CM

## 2022-04-23 DIAGNOSIS — F439 Reaction to severe stress, unspecified: Secondary | ICD-10-CM

## 2022-04-23 LAB — COMPREHENSIVE METABOLIC PANEL
ALT: 21 U/L (ref 0–35)
AST: 21 U/L (ref 0–37)
Albumin: 4 g/dL (ref 3.5–5.2)
Alkaline Phosphatase: 43 U/L (ref 39–117)
BUN: 12 mg/dL (ref 6–23)
CO2: 26 mEq/L (ref 19–32)
Calcium: 8.5 mg/dL (ref 8.4–10.5)
Chloride: 102 mEq/L (ref 96–112)
Creatinine, Ser: 0.57 mg/dL (ref 0.40–1.20)
GFR: 104.92 mL/min (ref >60.00)
Glucose, Bld: 106 mg/dL — ABNORMAL HIGH (ref 70–99)
Potassium: 4 mEq/L (ref 3.5–5.1)
Sodium: 138 mEq/L (ref 135–145)
Total Bilirubin: 0.6 mg/dL (ref 0.2–1.2)
Total Protein: 6.1 g/dL (ref 6.0–8.3)

## 2022-04-23 LAB — CBC WITH DIFFERENTIAL/PLATELET
Basophils Absolute: 0.1 10*3/uL (ref 0.0–0.1)
Basophils Relative: 1 % (ref 0.0–3.0)
Eosinophils Absolute: 0.1 10*3/uL (ref 0.0–0.7)
Eosinophils Relative: 2.2 % (ref 0.0–5.0)
HCT: 41.8 % (ref 36.0–46.0)
Hemoglobin: 14.3 g/dL (ref 12.0–15.0)
Lymphocytes Relative: 31.7 % (ref 12.0–46.0)
Lymphs Abs: 1.8 10*3/uL (ref 0.7–4.0)
MCHC: 34.2 g/dL (ref 30.0–36.0)
MCV: 100 fl (ref 78.0–100.0)
Monocytes Absolute: 0.4 10*3/uL (ref 0.1–1.0)
Monocytes Relative: 6.8 % (ref 3.0–12.0)
Neutro Abs: 3.3 10*3/uL (ref 1.4–7.7)
Neutrophils Relative %: 58.3 % (ref 43.0–77.0)
Platelets: 229 10*3/uL (ref 150.0–400.0)
RBC: 4.18 Mil/uL (ref 3.87–5.11)
RDW: 12.1 % (ref 11.5–15.5)
WBC: 5.6 10*3/uL (ref 4.0–10.5)

## 2022-04-23 LAB — LIPID PANEL
Cholesterol: 219 mg/dL — ABNORMAL HIGH (ref 0–200)
HDL: 47 mg/dL (ref >39.00)
NonHDL: 171.77
Total CHOL/HDL Ratio: 5
Triglycerides: 379 mg/dL — ABNORMAL HIGH (ref 0.0–149.0)
VLDL: 75.8 mg/dL — ABNORMAL HIGH (ref 0.0–40.0)

## 2022-04-23 LAB — VITAMIN B12: Vitamin B-12: 213 pg/mL (ref 211–911)

## 2022-04-23 LAB — LDL CHOLESTEROL, DIRECT: Direct LDL: 123 mg/dL

## 2022-04-23 LAB — FERRITIN: Ferritin: 153.3 ng/mL (ref 10.0–291.0)

## 2022-04-23 MED ORDER — MAGNESIUM OXIDE 400 MG PO TABS: 1.0000 | ORAL_TABLET | Freq: Every day | ORAL | 1 refills | Status: DC

## 2022-04-23 NOTE — Progress Notes (Addendum)
Subjective:    Patient ID: Brianna [name], female    DOB: Nov 11, 1970, 52 y.o.   MRN: [id number]  Patient here for  Chief Complaint  Patient presents with   Medical Management of Chronic Issues   Hypertension    HPI Here to follow up regarding weight loss, increased stress and hypertension.  Increased stress with husband's medical issues and work.  Overall appears to be handling things relatively well.  Has noticed recently - increased pain/aching - legs.  Has a history of RLS.  This has been more of an issue over the last couple of seeks.  Regarding her legs - describes behind knees and back of legs - ache - feel tight when bends.  Increased walking helps.  Also reports low back pain - right low back [- radiates out lateral.  No pain radiating down leg. Worse if stands in one place.  Problems sleeping.  Taking gabapentin '200mg'$  q hs.  No chest pain or sob reported.  No increased cough or congestion reported.  Is interested in taking phentermine.  Was prescribed previously by outside provider.  Took for one month.  Did well on the medication.  Reports blood pressure did well on the medication.  Would like to restart.  Mammogram scheduled for 05/07/22.    Past Medical History:  Diagnosis Date   Anemia    Chicken pox    Depression    GERD (gastroesophageal reflux disease)    Iron deficiency anemia 06/17/2018   Jaundice due to hepatitis    Restless leg syndrome    Past Surgical History:  Procedure Laterality Date   CESAREAN SECTION  [phone]   CHOLECYSTECTOMY  2003   COLONOSCOPY WITH PROPOFOL N/A 08/22/2019   Procedure: COLONOSCOPY WITH PROPOFOL;  Surgeon: Virgel Manifold, MD;  Location: [facility];  Service: Endoscopy;  Laterality: N/A;   ESOPHAGOGASTRODUODENOSCOPY (EGD) WITH PROPOFOL N/A 08/22/2019   Procedure: ESOPHAGOGASTRODUODENOSCOPY (EGD) WITH PROPOFOL;  Surgeon: Virgel Manifold, MD;  Location: [facility];  Service: Endoscopy;  Laterality: N/A;   HERNIA REPAIR      HYSTEROSCOPY WITH D & C N/A 08/28/2020   Procedure: DILATATION AND CURETTAGE /HYSTEROSCOPY;  Surgeon: Homero Fellers, MD;  Location: [facility];  Service: Gynecology;  Laterality: N/A;   Family History  Problem Relation Age of Onset   Early death Mother    Heart disease Mother    Hyperlipidemia Mother    Hypertension Mother    Alcohol abuse Father    Arthritis Father    COPD Father    Hyperlipidemia Father    Hypertension Father    Kidney disease Father    Arthritis Maternal Grandmother    Stroke Maternal Grandmother    Hyperlipidemia Maternal Grandfather    Breast cancer Paternal Aunt 64   Social History   Socioeconomic History   Marital status: Married    Spouse name: Not on file   Number of children: Not on file   Years of education: Not on file   Highest education level: Not on file  Occupational History   Not on file  Tobacco Use   Smoking status: Former   Smokeless tobacco: Never  Vaping Use   Vaping Use: Never used  Substance and Sexual Activity   Alcohol use: Yes    Alcohol/week: 7.0 standard drinks of alcohol    Types: 7 Glasses of wine per week    Comment: occasional    Drug use: Never   Sexual activity: Yes    Birth control/protection:  I.U.D., Post-menopausal  Other Topics Concern   Not on file  Social History Narrative   Not on file   Social Determinants of Health   Financial Resource Strain: Low Risk  (02/05/2021)   Overall Financial Resource Strain (CARDIA)    Difficulty of Paying Living Expenses: Not hard at all  Food Insecurity: No Food Insecurity (11/29/2019)   Hunger Vital Sign    Worried About Running Out of Food in the Last Year: Never true    Ran Out of Food in the Last Year: Never true  Transportation Needs: Not on file  Physical Activity: Inactive (11/29/2019)   Exercise Vital Sign    Days of Exercise per Week: 0 days    Minutes of Exercise per Session: 0 min  Stress: No Stress Concern Present (11/29/2019)   [place]    Feeling of Stress : Only a little  Social Connections: Not on file     Review of Systems  Constitutional:  Negative for appetite change and unexpected weight change.  HENT:  Negative for congestion and sinus pressure.   Respiratory:  Negative for cough, chest tightness and shortness of breath.   Cardiovascular:  Negative for chest pain, palpitations and leg swelling.  Gastrointestinal:  Negative for abdominal pain, diarrhea, nausea and vomiting.  Genitourinary:  Negative for difficulty urinating and dysuria.  Musculoskeletal:  Positive for back pain. Negative for joint swelling and myalgias.       Leg aching as outlined.   Skin:  Negative for color change and rash.  Neurological:  Negative for dizziness and headaches.  Psychiatric/Behavioral:  Negative for agitation and dysphoric mood.        Objective:     BP 130/78 (BP Location: Left Arm, Patient Position: Sitting, Cuff Size: Large)   Pulse 64   Temp 98.1 F (36.7 C) (Temporal)   Resp 16   Ht '5\' 5"'$  (1.651 m)   Wt 212 lb 9.6 oz (96.4 kg)   SpO2 98%   BMI 35.38 kg/m  Wt Readings from Last 3 Encounters:  04/23/22 212 lb 9.6 oz (96.4 kg)  10/18/21 203 lb (92.1 kg)  08/27/21 217 lb 3.2 oz (98.5 kg)    Physical Exam Vitals reviewed.  Constitutional:      General: She is not in acute distress.    Appearance: Normal appearance.  HENT:     Head: Normocephalic and atraumatic.     Right Ear: External ear normal.     Left Ear: External ear normal.  Eyes:     General: No scleral icterus.       Right eye: No discharge.        Left eye: No discharge.     Conjunctiva/sclera: Conjunctivae normal.  Neck:     Thyroid: No thyromegaly.  Cardiovascular:     Rate and Rhythm: Normal rate and regular rhythm.  Pulmonary:     Effort: No respiratory distress.     Breath sounds: Normal breath sounds. No wheezing.  Abdominal:     General: Bowel sounds are normal.      Palpations: Abdomen is soft.     Tenderness: There is no abdominal tenderness.  Musculoskeletal:        General: No swelling.     Cervical back: Neck supple. No tenderness.     Comments: Tenderness to palpation - right lower L-S spine.    Lymphadenopathy:     Cervical: No cervical adenopathy.  Skin:  Findings: No erythema or rash.  Neurological:     Mental Status: She is alert.  Psychiatric:        Mood and Affect: Mood normal.        Behavior: Behavior normal.      Outpatient Encounter Medications as of 04/23/2022  Medication Sig   acetaminophen (TYLENOL) 500 MG tablet Take 1 tablet (500 mg total) by mouth every 6 (six) hours.   busPIRone (BUSPAR) 10 MG tablet TAKE ONE TABLET BY MOUTH THREE TIMES A DAY   butalbital-acetaminophen-caffeine (FIORICET) 50-325-40 MG tablet Take 1 tablet by mouth 2 (two) times daily as needed for headache.   estradiol (ESTRACE) 1 MG tablet Take 1.5 tablets (1.5 mg total) by mouth daily.   Fe Fum-FePoly-Vit C-Vit B3 (INTEGRA) 62.5-62.5-40-3 MG CAPS TAKE ONE CAPSULE BY MOUTH DAILY   gabapentin (NEURONTIN) 300 MG capsule Take 1 capsule (300 mg total) by mouth at bedtime.   metoprolol tartrate (LOPRESSOR) 25 MG tablet TAKE ONE TABLET BY MOUTH TWICE A DAY   omeprazole (PRILOSEC) 20 MG capsule TAKE ONE CAPSULE BY MOUTH DAILY   phentermine 37.5 MG capsule Take 1 capsule (37.5 mg total) by mouth daily.   Polyethyl Glycol-Propyl Glycol (SYSTANE ULTRA OP) Place 1 drop into both eyes every 6 (six) hours as needed (dry eyes).   [DISCONTINUED] gabapentin (NEURONTIN) 100 MG capsule TAKE ONE TO TWO CAPSULES BY MOUTH AT BEDTIME   [DISCONTINUED] magnesium oxide (MAG-OX) 400 MG tablet TAKE ONE TABLET BY MOUTH DAILY   magnesium oxide (MAG-OX) 400 MG tablet Take 1 tablet (400 mg total) by mouth daily.   No facility-administered encounter medications on file as of 04/23/2022.     Lab Results  Component Value Date   WBC 5.6 04/23/2022   HGB 14.3 04/23/2022   HCT 41.8  04/23/2022   PLT 229.0 04/23/2022   GLUCOSE 106 (H) 04/23/2022   CHOL 219 (H) 04/23/2022   TRIG 379.0 (H) 04/23/2022   HDL 47.00 04/23/2022   LDLDIRECT 123.0 04/23/2022   LDLCALC 111 (H) 03/19/2021   ALT 21 04/23/2022   AST 21 04/23/2022   NA 138 04/23/2022   K 4.0 04/23/2022   CL 102 04/23/2022   CREATININE 0.57 04/23/2022   BUN 12 04/23/2022   CO2 26 04/23/2022   TSH 3.42 04/23/2022    No results found.     Assessment & Plan:  Pain in both lower extremities Assessment & Plan: Leg pain/aching as outlined.  Increase gabapentin to help with RLS.  Discussed wearing compression hose.     Iron deficiency anemia, unspecified iron deficiency anemia type Assessment & Plan: Has seen hematology previously.  Follow cbc and iron studies.    Orders: -     CBC with Differential/Platelet -     Ferritin  Stress Assessment & Plan: Appears to be doing relatively well.  Adjust dose of gabapentin to see if helps with sleep.    Essential hypertension, benign Assessment & Plan: On metoprolol '25mg'$  bid.  Follow pressures.  Elevated today on my check.  Hold on phentermine.  She will monitor pressures and send in readings.  Hold on changing medication.  Follow metabolic panel.   Orders: -     Comprehensive metabolic panel -     TSH -     Lipid panel -     Folate RBC -     Vitamin B12 -     LDL cholesterol, direct  Chronic midline low back pain without sciatica Assessment & Plan: Persistent issue  and symptoms as outlined.  Taking an increased amount of tylenol.  Check L-S spine xray.    Orders: -     DG Lumbar Spine 2-3 Views; Future  Need for influenza vaccination -     Flu Vaccine QUAD 50moIM (Fluarix, Fluzone & Alfiuria Quad PF)  Barrett's esophagus without dysplasia Assessment & Plan: Followed by GI.  Continue prilosec.  No upper symptoms reported.    Gastroesophageal reflux disease, unspecified whether esophagitis present Assessment & Plan: Controlled on prilosec.     Restless leg Assessment & Plan: Worsened recently.  Discussed wearing compression hose.  Increase gabapentin to '300mg'$  q hs.  Check cbc, iron studies along with routine labs.    Weight loss counseling, encounter for Assessment & Plan: Desires to take phentermine as outlined.  Insurance will not cover wegovy.  Concern regarding elevated blood pressure.  Will hold on starting.  Monitor pressures.  Continue diet and exercise.     Other orders -     Magnesium Oxide; Take 1 tablet (400 mg total) by mouth daily.  Dispense: 90 tablet; Refill: 1 -     Gabapentin; Take 1 capsule (300 mg total) by mouth at bedtime.  Dispense: 30 capsule; Refill: 2     CEinar Pheasant MD

## 2022-04-24 LAB — TSH: TSH: 3.42 u[IU]/mL (ref 0.35–5.50)

## 2022-04-25 ENCOUNTER — Encounter: Payer: Self-pay | Admitting: Internal Medicine

## 2022-04-25 ENCOUNTER — Other Ambulatory Visit: Payer: Self-pay

## 2022-04-25 DIAGNOSIS — M545 Low back pain, unspecified: Secondary | ICD-10-CM

## 2022-04-25 DIAGNOSIS — M79606 Pain in leg, unspecified: Secondary | ICD-10-CM | POA: Insufficient documentation

## 2022-04-25 LAB — FOLATE RBC: RBC Folate: 500 ng/mL RBC (ref >280)

## 2022-04-25 MED ORDER — GABAPENTIN 300 MG PO CAPS: 300.0000 mg | ORAL_CAPSULE | Freq: Every day | ORAL | 2 refills | Status: DC

## 2022-04-25 NOTE — Assessment & Plan Note (Signed)
Leg pain/aching as outlined.  Increase gabapentin to help with RLS.  Discussed wearing compression hose.

## 2022-04-25 NOTE — Assessment & Plan Note (Signed)
Persistent issue and symptoms as outlined.  Taking an increased amount of tylenol.  Check L-S spine xray.

## 2022-04-25 NOTE — Assessment & Plan Note (Signed)
Has seen hematology previously.  Follow cbc and iron studies.

## 2022-04-25 NOTE — Assessment & Plan Note (Signed)
Desires to take phentermine as outlined.  Insurance will not cover wegovy.  Concern regarding elevated blood pressure.  Will hold on starting.  Monitor pressures.  Continue diet and exercise.

## 2022-04-25 NOTE — Assessment & Plan Note (Signed)
Worsened recently.  Discussed wearing compression hose.  Increase gabapentin to '300mg'$  q hs.  Check cbc, iron studies along with routine labs.

## 2022-04-25 NOTE — Addendum Note (Signed)
Addended by: Alisa Graff on: 04/25/2022 12:49 PM   Modules accepted: Orders

## 2022-04-25 NOTE — Assessment & Plan Note (Signed)
Appears to be doing relatively well.  Adjust dose of gabapentin to see if helps with sleep.

## 2022-04-25 NOTE — Assessment & Plan Note (Signed)
On metoprolol '25mg'$  bid.  Follow pressures.  Elevated today on my check.  Hold on phentermine.  She will monitor pressures and send in readings.  Hold on changing medication.  Follow metabolic panel.

## 2022-04-25 NOTE — Addendum Note (Signed)
Addended by: Alisa Graff on: 04/25/2022 02:53 AM   Modules accepted: Level of Service

## 2022-04-25 NOTE — Assessment & Plan Note (Signed)
Followed by GI.  Continue prilosec.  No upper symptoms reported.

## 2022-04-25 NOTE — Assessment & Plan Note (Signed)
Controlled on prilosec.   

## 2022-04-26 ENCOUNTER — Other Ambulatory Visit: Payer: Self-pay | Admitting: Internal Medicine

## 2022-04-28 ENCOUNTER — Ambulatory Visit (INDEPENDENT_AMBULATORY_CARE_PROVIDER_SITE_OTHER): Payer: 59

## 2022-04-28 DIAGNOSIS — E538 Deficiency of other specified B group vitamins: Secondary | ICD-10-CM

## 2022-04-28 MED ORDER — CYANOCOBALAMIN 1000 MCG/ML IJ SOLN
1000.0000 ug | Freq: Once | INTRAMUSCULAR | Status: AC
  Administered 2022-04-28: 1000 ug via INTRAMUSCULAR

## 2022-04-28 NOTE — Progress Notes (Signed)
Patient presented for B 12 injection to left deltoid per Dr. [name] order, patient voiced no concerns nor showed any signs of distress during injection.

## 2022-05-05 ENCOUNTER — Ambulatory Visit (INDEPENDENT_AMBULATORY_CARE_PROVIDER_SITE_OTHER): Payer: 59

## 2022-05-05 DIAGNOSIS — E538 Deficiency of other specified B group vitamins: Secondary | ICD-10-CM

## 2022-05-05 MED ORDER — CYANOCOBALAMIN 1000 MCG/ML IJ SOLN
1000.0000 ug | Freq: Once | INTRAMUSCULAR | Status: AC
  Administered 2022-05-05: 1000 ug via INTRAMUSCULAR

## 2022-05-05 NOTE — Progress Notes (Signed)
Pt presented for their vitamin B12 injection. Pt was identified through two identifiers. Pt tolerated shot well in their right deltoid.  

## 2022-05-07 ENCOUNTER — Ambulatory Visit
Admission: RE | Admit: 2022-05-07 | Discharge: 2022-05-07 | Disposition: A | Discharge status: Home / Self Care | Payer: 59 | Source: Ambulatory Visit | Attending: Internal Medicine | Admitting: Internal Medicine

## 2022-05-07 DIAGNOSIS — Z1231 Encounter for screening mammogram for malignant neoplasm of breast: Secondary | ICD-10-CM | POA: Insufficient documentation

## 2022-05-09 ENCOUNTER — Other Ambulatory Visit: Payer: Self-pay | Admitting: Internal Medicine

## 2022-05-09 DIAGNOSIS — R928 Other abnormal and inconclusive findings on diagnostic imaging of breast: Secondary | ICD-10-CM

## 2022-05-09 NOTE — Progress Notes (Signed)
Order placed for f/u left breast mammogram and ultrasound.   

## 2022-05-12 ENCOUNTER — Ambulatory Visit (INDEPENDENT_AMBULATORY_CARE_PROVIDER_SITE_OTHER): Payer: 59

## 2022-05-12 DIAGNOSIS — E538 Deficiency of other specified B group vitamins: Secondary | ICD-10-CM

## 2022-05-12 MED ORDER — CYANOCOBALAMIN 1000 MCG/ML IJ SOLN
1000.0000 ug | Freq: Once | INTRAMUSCULAR | Status: AC
  Administered 2022-05-12: 1000 ug via INTRAMUSCULAR

## 2022-05-12 NOTE — Progress Notes (Signed)
Pt presented for their vitamin B12 injection. Pt was identified through two identifiers. Pt tolerated shot well in their left  deltoid.  

## 2022-05-14 ENCOUNTER — Ambulatory Visit
Admission: RE | Admit: 2022-05-14 | Discharge: 2022-05-14 | Disposition: A | Discharge status: Home / Self Care | Payer: 59 | Source: Ambulatory Visit | Attending: Internal Medicine | Admitting: Internal Medicine

## 2022-05-14 DIAGNOSIS — R928 Other abnormal and inconclusive findings on diagnostic imaging of breast: Secondary | ICD-10-CM | POA: Diagnosis not present

## 2022-05-19 ENCOUNTER — Ambulatory Visit (INDEPENDENT_AMBULATORY_CARE_PROVIDER_SITE_OTHER): Payer: 59

## 2022-05-19 DIAGNOSIS — E538 Deficiency of other specified B group vitamins: Secondary | ICD-10-CM

## 2022-05-19 MED ORDER — CYANOCOBALAMIN 1000 MCG/ML IJ SOLN
1000.0000 ug | Freq: Once | INTRAMUSCULAR | Status: AC
  Administered 2022-05-19: 1000 ug via INTRAMUSCULAR

## 2022-05-19 NOTE — Progress Notes (Signed)
Patient presented for B 12 injection to right deltoid, patient voiced no concerns nor showed any signs of distress during injection. 

## 2022-05-31 ENCOUNTER — Other Ambulatory Visit: Payer: Self-pay | Admitting: Obstetrics and Gynecology

## 2022-05-31 DIAGNOSIS — F419 Anxiety disorder, unspecified: Secondary | ICD-10-CM

## 2022-06-10 ENCOUNTER — Ambulatory Visit: Payer: Self-pay | Admitting: Internal Medicine

## 2022-07-18 ENCOUNTER — Other Ambulatory Visit: Payer: Self-pay | Admitting: Internal Medicine

## 2022-07-18 DIAGNOSIS — K2270 Barrett's esophagus without dysplasia: Secondary | ICD-10-CM

## 2022-07-21 ENCOUNTER — Other Ambulatory Visit: Payer: Self-pay | Admitting: Internal Medicine

## 2022-07-23 NOTE — Telephone Encounter (Signed)
Reviewed 04/2022 note.  Was informed will hold on phentermine given following blood pressure.  Does not have a f/u appt scheduled.  Needs f/u appt scheduled to follow up on general medical issues.

## 2022-07-30 ENCOUNTER — Encounter: Payer: Self-pay | Admitting: Oncology

## 2022-08-01 ENCOUNTER — Encounter: Payer: Self-pay | Admitting: Internal Medicine

## 2022-08-01 ENCOUNTER — Ambulatory Visit (INDEPENDENT_AMBULATORY_CARE_PROVIDER_SITE_OTHER): Payer: 59 | Admitting: Internal Medicine

## 2022-08-01 VITALS — BP 128/70 | HR 74 | Temp 98.2°F | Resp 16 | Ht 64.0 in | Wt 213.0 lb

## 2022-08-01 DIAGNOSIS — D509 Iron deficiency anemia, unspecified: Secondary | ICD-10-CM | POA: Diagnosis not present

## 2022-08-01 DIAGNOSIS — Z713 Dietary counseling and surveillance: Secondary | ICD-10-CM

## 2022-08-01 DIAGNOSIS — K219 Gastro-esophageal reflux disease without esophagitis: Secondary | ICD-10-CM

## 2022-08-01 DIAGNOSIS — F439 Reaction to severe stress, unspecified: Secondary | ICD-10-CM

## 2022-08-01 DIAGNOSIS — K2270 Barrett's esophagus without dysplasia: Secondary | ICD-10-CM | POA: Diagnosis not present

## 2022-08-01 DIAGNOSIS — I10 Essential (primary) hypertension: Secondary | ICD-10-CM | POA: Diagnosis not present

## 2022-08-01 DIAGNOSIS — Z13220 Encounter for screening for lipoid disorders: Secondary | ICD-10-CM

## 2022-08-01 MED ORDER — GABAPENTIN 300 MG PO CAPS: 300.0000 mg | ORAL_CAPSULE | Freq: Every day | ORAL | 2 refills | Status: DC

## 2022-08-01 MED ORDER — INTEGRA 62.5-62.5-40-3 MG PO CAPS: 1.0000 | ORAL_CAPSULE | Freq: Every day | ORAL | 1 refills | Status: DC

## 2022-08-01 MED ORDER — VENLAFAXINE HCL ER 37.5 MG PO CP24
37.50 mg | ORAL_CAPSULE | Freq: Every day | ORAL | 2 refills | Status: DC
Start: 2022-08-01 — End: 2022-10-28

## 2022-08-01 MED ORDER — OMEPRAZOLE 20 MG PO CPDR: 20.0000 mg | DELAYED_RELEASE_CAPSULE | Freq: Every day | ORAL | 3 refills | Status: DC

## 2022-08-01 MED ORDER — MAGNESIUM OXIDE 400 MG PO TABS: 1.0000 | ORAL_TABLET | Freq: Every day | ORAL | 3 refills | Status: DC

## 2022-08-01 MED ORDER — METOPROLOL TARTRATE 25 MG PO TABS: 25.0000 mg | ORAL_TABLET | Freq: Two times a day (BID) | ORAL | 1 refills | Status: DC

## 2022-08-01 NOTE — Progress Notes (Unsigned)
Subjective:    Patient ID: Brianna [name], female    DOB: [birth year], 52 y.o.   MRN: [id number]  Patient here for  Chief Complaint  Patient presents with   Medical Management of Chronic Issues    HPI Here to follow up regarding weight loss, increased stress and hypertension. Reports increased anxiety.  Has noticed this has worsened over the last few weeks.  Quick to get upset.  Does not know why feeling this way.  No acute event or change.  No chest pain or sob reported.  No abdominal pain or bowel change reported.  Reports hot flashes.  Discussed treatment.  Does feel needs something to help level things out and help with hot flashes.  On estrogen.  Also taking buspar.     Past Medical History:  Diagnosis Date   Anemia    Chicken pox    Depression    GERD (gastroesophageal reflux disease)    Iron deficiency anemia 06/17/2018   Jaundice due to hepatitis    Restless leg syndrome    Past Surgical History:  Procedure Laterality Date   CESAREAN SECTION  [phone]   CHOLECYSTECTOMY  2003   COLONOSCOPY WITH PROPOFOL N/A 08/22/2019   Procedure: COLONOSCOPY WITH PROPOFOL;  Surgeon: Pasty Spillers, MD;  Location: [facility];  Service: Endoscopy;  Laterality: N/A;   ESOPHAGOGASTRODUODENOSCOPY (EGD) WITH PROPOFOL N/A 08/22/2019   Procedure: ESOPHAGOGASTRODUODENOSCOPY (EGD) WITH PROPOFOL;  Surgeon: Pasty Spillers, MD;  Location: [facility];  Service: Endoscopy;  Laterality: N/A;   HERNIA REPAIR     HYSTEROSCOPY WITH D & C N/A 08/28/2020   Procedure: DILATATION AND CURETTAGE /HYSTEROSCOPY;  Surgeon: Natale Milch, MD;  Location: [facility];  Service: Gynecology;  Laterality: N/A;   Family History  Problem Relation Age of Onset   Early death Mother    Heart disease Mother    Hyperlipidemia Mother    Hypertension Mother    Alcohol abuse Father    Arthritis Father    COPD Father    Hyperlipidemia Father    Hypertension Father    Kidney disease Father     Arthritis Maternal Grandmother    Stroke Maternal Grandmother    Hyperlipidemia Maternal Grandfather    Breast cancer Paternal Aunt 64   Social History   Socioeconomic History   Marital status: Married    Spouse name: Not on file   Number of children: Not on file   Years of education: Not on file   Highest education level: Not on file  Occupational History   Not on file  Tobacco Use   Smoking status: Former   Smokeless tobacco: Never  Vaping Use   Vaping Use: Never used  Substance and Sexual Activity   Alcohol use: Yes    Alcohol/week: 7.0 standard drinks of alcohol    Types: 7 Glasses of wine per week    Comment: occasional    Drug use: Never   Sexual activity: Yes    Birth control/protection: I.U.D., Post-menopausal  Other Topics Concern   Not on file  Social History Narrative   Not on file   Social Determinants of Health   Financial Resource Strain: Low Risk  (02/05/2021)   Overall Financial Resource Strain (CARDIA)    Difficulty of Paying Living Expenses: Not hard at all  Food Insecurity: No Food Insecurity (11/29/2019)   Hunger Vital Sign    Worried About Running Out of Food in the Last Year: Never true    Ran Out of  Food in the Last Year: Never true  Transportation Needs: Not on file  Physical Activity: Inactive (11/29/2019)   Exercise Vital Sign    Days of Exercise per Week: 0 days    Minutes of Exercise per Session: 0 min  Stress: No Stress Concern Present (11/29/2019)   [employer] of Occupational Health - Occupational Stress Questionnaire    Feeling of Stress : Only a little  Social Connections: Not on file     Review of Systems  Constitutional:  Negative for appetite change and unexpected weight change.  HENT:  Negative for congestion and sinus pressure.   Respiratory:  Negative for cough, chest tightness and shortness of breath.   Cardiovascular:  Negative for chest pain and palpitations.  Gastrointestinal:  Negative for abdominal pain,  diarrhea, nausea and vomiting.  Genitourinary:  Negative for difficulty urinating and dysuria.  Musculoskeletal:  Negative for joint swelling and myalgias.  Skin:  Negative for color change and rash.  Neurological:  Negative for dizziness and headaches.  Psychiatric/Behavioral:  Negative for agitation and dysphoric mood.        Objective:     BP 128/70   Pulse 74   Temp 98.2 F (36.8 C)   Resp 16   Ht 5\' 4"  (1.626 m)   Wt 213 lb (96.6 kg)   SpO2 97%   BMI 36.56 kg/m  Wt Readings from Last 3 Encounters:  08/01/22 213 lb (96.6 kg)  04/23/22 212 lb 9.6 oz (96.4 kg)  10/18/21 203 lb (92.1 kg)    Physical Exam Vitals reviewed.  Constitutional:      General: She is not in acute distress.    Appearance: Normal appearance.  HENT:     Head: Normocephalic and atraumatic.     Right Ear: External ear normal.     Left Ear: External ear normal.  Eyes:     General: No scleral icterus.       Right eye: No discharge.        Left eye: No discharge.     Conjunctiva/sclera: Conjunctivae normal.  Neck:     Thyroid: No thyromegaly.  Cardiovascular:     Rate and Rhythm: Normal rate and regular rhythm.  Pulmonary:     Effort: No respiratory distress.     Breath sounds: Normal breath sounds. No wheezing.  Abdominal:     General: Bowel sounds are normal.     Palpations: Abdomen is soft.     Tenderness: There is no abdominal tenderness.  Musculoskeletal:        General: No swelling or tenderness.     Cervical back: Neck supple. No tenderness.  Lymphadenopathy:     Cervical: No cervical adenopathy.  Skin:    Findings: No erythema or rash.  Neurological:     Mental Status: She is alert.  Psychiatric:        Mood and Affect: Mood normal.        Behavior: Behavior normal.      Outpatient Encounter Medications as of 08/01/2022  Medication Sig   venlafaxine XR (EFFEXOR XR) 37.5 MG 24 hr capsule Take 1 capsule (37.5 mg total) by mouth daily with breakfast.   acetaminophen  (TYLENOL) 500 MG tablet Take 1 tablet (500 mg total) by mouth every 6 (six) hours.   busPIRone (BUSPAR) 10 MG tablet TAKE ONE TABLET BY MOUTH THREE TIMES A DAY   butalbital-acetaminophen-caffeine (FIORICET) 50-325-40 MG tablet Take 1 tablet by mouth 2 (two) times daily as needed for headache.  estradiol (ESTRACE) 1 MG tablet Take 1.5 tablets (1.5 mg total) by mouth daily.   Fe Fum-FePoly-Vit C-Vit B3 (INTEGRA) 62.5-62.5-40-3 MG CAPS Take 1 capsule by mouth daily.   gabapentin (NEURONTIN) 300 MG capsule Take 1 capsule (300 mg total) by mouth at bedtime.   magnesium oxide (MAG-OX) 400 MG tablet Take 1 tablet (400 mg total) by mouth daily.   metoprolol tartrate (LOPRESSOR) 25 MG tablet Take 1 tablet (25 mg total) by mouth 2 (two) times daily.   omeprazole (PRILOSEC) 20 MG capsule Take 1 capsule (20 mg total) by mouth daily.   Polyethyl Glycol-Propyl Glycol (SYSTANE ULTRA OP) Place 1 drop into both eyes every 6 (six) hours as needed (dry eyes).   [DISCONTINUED] Fe Fum-FePoly-Vit C-Vit B3 (INTEGRA) 62.5-62.5-40-3 MG CAPS TAKE ONE CAPSULE BY MOUTH DAILY   [DISCONTINUED] gabapentin (NEURONTIN) 300 MG capsule Take 1 capsule (300 mg total) by mouth at bedtime.   [DISCONTINUED] magnesium oxide (MAG-OX) 400 MG tablet Take 1 tablet (400 mg total) by mouth daily.   [DISCONTINUED] metoprolol tartrate (LOPRESSOR) 25 MG tablet TAKE 1 TABLET BY MOUTH TWICE A DAY   [DISCONTINUED] omeprazole (PRILOSEC) 20 MG capsule TAKE 1 CAPSULE BY MOUTH DAILY   [DISCONTINUED] phentermine 37.5 MG capsule Take 1 capsule (37.5 mg total) by mouth daily.   No facility-administered encounter medications on file as of 08/01/2022.     Lab Results  Component Value Date   WBC 5.6 04/23/2022   HGB 14.3 04/23/2022   HCT 41.8 04/23/2022   PLT 229.0 04/23/2022   GLUCOSE 106 (H) 04/23/2022   CHOL 219 (H) 04/23/2022   TRIG 379.0 (H) 04/23/2022   HDL 47.00 04/23/2022   LDLDIRECT 123.0 04/23/2022   LDLCALC 111 (H) 03/19/2021   ALT 21  04/23/2022   AST 21 04/23/2022   NA 138 04/23/2022   K 4.0 04/23/2022   CL 102 04/23/2022   CREATININE 0.57 04/23/2022   BUN 12 04/23/2022   CO2 26 04/23/2022   TSH 3.42 04/23/2022    MM DIAG BREAST TOMO UNI LEFT  Result Date: 05/14/2022 CLINICAL DATA:  Screening recall for a possible left breast asymmetry. EXAM: DIGITAL DIAGNOSTIC UNILATERAL LEFT MAMMOGRAM WITH TOMOSYNTHESIS TECHNIQUE: Left digital diagnostic mammography and breast tomosynthesis was performed. COMPARISON:  Previous exam(s). ACR Breast Density Category b: There are scattered areas of fibroglandular density. FINDINGS: On the diagnostic spot compression imaging, the possible asymmetry noted on the current screening left breast cc view disperses consistent with superimposed fibroglandular tissue. There is no underlying mass or significant residual asymmetry. IMPRESSION: No evidence of breast malignancy. RECOMMENDATION: Screening mammogram in one year.(Code:[id number]-B-01Y) I have discussed the findings and recommendations with the patient. If applicable, a reminder letter will be sent to the patient regarding the next appointment. BI-RADS CATEGORY  1: Negative. Electronically Signed   By: Amie Portland M.D.   On: 05/14/2022 13:55       Assessment & Plan:  Screening cholesterol level  Barrett's esophagus without dysplasia Assessment & Plan: Followed by GI.  Continue prilosec.  No upper symptoms reported.   Orders: -     Omeprazole; Take 1 capsule (20 mg total) by mouth daily.  Dispense: 90 capsule; Refill: 3  Essential hypertension, benign Assessment & Plan: On metoprolol  bid.  Follow pressures. Since starting effexor, will need to follow blood pressure closely.  Follow metabolic panel.   Orders: -     Lipid panel; Future -     Comprehensive metabolic panel; Future  Gastroesophageal reflux disease, unspecified  whether esophagitis present Assessment & Plan: Controlled on prilosec.    Iron deficiency anemia,  unspecified iron deficiency anemia type Assessment & Plan: Has seen hematology previously.  Follow cbc and iron studies.    Orders: -     CBC with Differential/Platelet; Future -     Ferritin; Future  Stress Assessment & Plan: Increased anxiety as outlined.  Discussed treatment options.  Continues on buspar.  Discussed treatment options.  Will start effexor 37.5mg  q day.  Follow blood pressure.  Notify if feels needs something more.  Follow    Weight loss counseling, encounter for Assessment & Plan: Off GLP 1 agonist.  Diet and exercise.  Follow.     Other orders -     Integra; Take 1 capsule by mouth daily.  Dispense: 90 capsule; Refill: 1 -     Gabapentin; Take 1 capsule (300 mg total) by mouth at bedtime.  Dispense: 90 capsule; Refill: 2 -     Magnesium Oxide; Take 1 tablet (400 mg total) by mouth daily.  Dispense: 90 tablet; Refill: 3 -     Metoprolol Tartrate; Take 1 tablet (25 mg total) by mouth 2 (two) times daily.  Dispense: 180 tablet; Refill: 1 -     Venlafaxine HCl ER; Take 1 capsule (37.5 mg total) by mouth daily with breakfast.  Dispense: 30 capsule; Refill: 2     Dale [place], MD

## 2022-08-02 ENCOUNTER — Encounter: Payer: Self-pay | Admitting: Internal Medicine

## 2022-08-02 NOTE — Assessment & Plan Note (Signed)
Increased anxiety as outlined.  Discussed treatment options.  Continues on buspar.  Discussed treatment options.  Will start effexor 37.5mg  q day.  Follow blood pressure.  Notify if feels needs something more.  Follow

## 2022-08-02 NOTE — Assessment & Plan Note (Signed)
Has seen hematology previously.  Follow cbc and iron studies.   

## 2022-08-02 NOTE — Assessment & Plan Note (Signed)
Followed by GI.  Continue prilosec.  No upper symptoms reported.  

## 2022-08-02 NOTE — Assessment & Plan Note (Signed)
Controlled on prilosec.   

## 2022-08-02 NOTE — Assessment & Plan Note (Signed)
On metoprolol 25mg  bid.  Follow pressures. Since starting effexor, will need to follow blood pressure closely.  Follow metabolic panel.

## 2022-08-02 NOTE — Assessment & Plan Note (Signed)
Off GLP 1 agonist.  Diet and exercise.  Follow.

## 2022-08-12 ENCOUNTER — Other Ambulatory Visit: Payer: Self-pay | Admitting: Obstetrics and Gynecology

## 2022-08-12 DIAGNOSIS — N951 Menopausal and female climacteric states: Secondary | ICD-10-CM

## 2022-08-18 ENCOUNTER — Ambulatory Visit: Payer: 59 | Admitting: Internal Medicine

## 2022-09-11 ENCOUNTER — Other Ambulatory Visit (INDEPENDENT_AMBULATORY_CARE_PROVIDER_SITE_OTHER): Payer: 59

## 2022-09-11 DIAGNOSIS — D509 Iron deficiency anemia, unspecified: Secondary | ICD-10-CM

## 2022-09-11 DIAGNOSIS — I10 Essential (primary) hypertension: Secondary | ICD-10-CM

## 2022-09-11 LAB — CBC WITH DIFFERENTIAL/PLATELET
Basophils Absolute: 0.1 10*3/uL (ref 0.0–0.1)
Basophils Relative: 0.9 % (ref 0.0–3.0)
Eosinophils Absolute: 0.1 10*3/uL (ref 0.0–0.7)
Eosinophils Relative: 1.7 % (ref 0.0–5.0)
HCT: 41.8 % (ref 36.0–46.0)
Hemoglobin: 14.2 g/dL (ref 12.0–15.0)
Lymphocytes Relative: 33 % (ref 12.0–46.0)
Lymphs Abs: 2.2 10*3/uL (ref 0.7–4.0)
MCHC: 33.9 g/dL (ref 30.0–36.0)
MCV: 100.1 fl — ABNORMAL HIGH (ref 78.0–100.0)
Monocytes Absolute: 0.4 10*3/uL (ref 0.1–1.0)
Monocytes Relative: 6.5 % (ref 3.0–12.0)
Neutro Abs: 3.9 10*3/uL (ref 1.4–7.7)
Neutrophils Relative %: 57.9 % (ref 43.0–77.0)
Platelets: 237 10*3/uL (ref 150.0–400.0)
RBC: 4.18 Mil/uL (ref 3.87–5.11)
RDW: 12.6 % (ref 11.5–15.5)
WBC: 6.8 10*3/uL (ref 4.0–10.5)

## 2022-09-11 LAB — COMPREHENSIVE METABOLIC PANEL
ALT: 15 U/L (ref 0–35)
AST: 15 U/L (ref 0–37)
Albumin: 3.9 g/dL (ref 3.5–5.2)
Alkaline Phosphatase: 38 U/L — ABNORMAL LOW (ref 39–117)
BUN: 12 mg/dL (ref 6–23)
CO2: 29 mEq/L (ref 19–32)
Calcium: 8.5 mg/dL (ref 8.4–10.5)
Chloride: 102 mEq/L (ref 96–112)
Creatinine, Ser: 0.71 mg/dL (ref 0.40–1.20)
GFR: 97.9 mL/min (ref >60.00)
Glucose, Bld: 93 mg/dL (ref 70–99)
Potassium: 4.4 mEq/L (ref 3.5–5.1)
Sodium: 140 mEq/L (ref 135–145)
Total Bilirubin: 0.4 mg/dL (ref 0.2–1.2)
Total Protein: 6.1 g/dL (ref 6.0–8.3)

## 2022-09-11 LAB — LDL CHOLESTEROL, DIRECT: Direct LDL: 113 mg/dL

## 2022-09-11 LAB — LIPID PANEL
Cholesterol: 192 mg/dL (ref 0–200)
HDL: 49.9 mg/dL (ref >39.00)
NonHDL: 142.39
Total CHOL/HDL Ratio: 4
Triglycerides: 251 mg/dL — ABNORMAL HIGH (ref 0.0–149.0)
VLDL: 50.2 mg/dL — ABNORMAL HIGH (ref 0.0–40.0)

## 2022-09-11 LAB — FERRITIN: Ferritin: 144.9 ng/mL (ref 10.0–291.0)

## 2022-09-17 ENCOUNTER — Ambulatory Visit (INDEPENDENT_AMBULATORY_CARE_PROVIDER_SITE_OTHER): Payer: 59 | Admitting: Internal Medicine

## 2022-09-17 DIAGNOSIS — I10 Essential (primary) hypertension: Secondary | ICD-10-CM

## 2022-09-17 NOTE — Progress Notes (Unsigned)
Subjective:    Patient ID: Brianna [name], female    DOB: 12-Feb-1971, 52 y.o.   MRN: [id number]  Patient here for No chief complaint on file.   HPI Here to follow up regarding weight loss, increased stress and hypertension. Evaluated 07/2022 - increased stress and hot flashes.  Started on effexor.     Past Medical History:  Diagnosis Date   Anemia    Chicken pox    Depression    GERD (gastroesophageal reflux disease)    Iron deficiency anemia 06/17/2018   Jaundice due to hepatitis    Restless leg syndrome    Past Surgical History:  Procedure Laterality Date   CESAREAN SECTION  [phone]   CHOLECYSTECTOMY  2003   COLONOSCOPY WITH PROPOFOL N/A 08/22/2019   Procedure: COLONOSCOPY WITH PROPOFOL;  Surgeon: Pasty Spillers, MD;  Location: [facility];  Service: Endoscopy;  Laterality: N/A;   ESOPHAGOGASTRODUODENOSCOPY (EGD) WITH PROPOFOL N/A 08/22/2019   Procedure: ESOPHAGOGASTRODUODENOSCOPY (EGD) WITH PROPOFOL;  Surgeon: Pasty Spillers, MD;  Location: [facility];  Service: Endoscopy;  Laterality: N/A;   HERNIA REPAIR     HYSTEROSCOPY WITH D & C N/A 08/28/2020   Procedure: DILATATION AND CURETTAGE /HYSTEROSCOPY;  Surgeon: Natale Milch, MD;  Location: [facility];  Service: Gynecology;  Laterality: N/A;   Family History  Problem Relation Age of Onset   Early death Mother    Heart disease Mother    Hyperlipidemia Mother    Hypertension Mother    Alcohol abuse Father    Arthritis Father    COPD Father    Hyperlipidemia Father    Hypertension Father    Kidney disease Father    Arthritis Maternal Grandmother    Stroke Maternal Grandmother    Hyperlipidemia Maternal Grandfather    Breast cancer Paternal Aunt 11   Social History   Socioeconomic History   Marital status: Married    Spouse name: Not on file   Number of children: Not on file   Years of education: Not on file   Highest education level: Not on file  Occupational History   Not on file   Tobacco Use   Smoking status: Former   Smokeless tobacco: Never  Vaping Use   Vaping Use: Never used  Substance and Sexual Activity   Alcohol use: Yes    Alcohol/week: 7.0 standard drinks of alcohol    Types: 7 Glasses of wine per week    Comment: occasional    Drug use: Never   Sexual activity: Yes    Birth control/protection: I.U.D., Post-menopausal  Other Topics Concern   Not on file  Social History Narrative   Not on file   Social Determinants of Health   Financial Resource Strain: Low Risk  (02/05/2021)   Overall Financial Resource Strain (CARDIA)    Difficulty of Paying Living Expenses: Not hard at all  Food Insecurity: No Food Insecurity (11/29/2019)   Hunger Vital Sign    Worried About Running Out of Food in the Last Year: Never true    Ran Out of Food in the Last Year: Never true  Transportation Needs: Not on file  Physical Activity: Inactive (11/29/2019)   Exercise Vital Sign    Days of Exercise per Week: 0 days    Minutes of Exercise per Session: 0 min  Stress: No Stress Concern Present (11/29/2019)   [employer] of Occupational Health - Occupational Stress Questionnaire    Feeling of Stress : Only a little  Social Connections: Not  on file     Review of Systems     Objective:     There were no vitals taken for this visit. Wt Readings from Last 3 Encounters:  08/01/22 213 lb (96.6 kg)  04/23/22 212 lb 9.6 oz (96.4 kg)  10/18/21 203 lb (92.1 kg)    Physical Exam   Outpatient Encounter Medications as of 09/17/2022  Medication Sig   acetaminophen (TYLENOL) 500 MG tablet Take 1 tablet (500 mg total) by mouth every 6 (six) hours.   busPIRone (BUSPAR) 10 MG tablet TAKE ONE TABLET BY MOUTH THREE TIMES A DAY   butalbital-acetaminophen-caffeine (FIORICET) 50-325-40 MG tablet Take 1 tablet by mouth 2 (two) times daily as needed for headache.   estradiol (ESTRACE) 1 MG tablet TAKE 1 AND 1/2 TABLET BY MOUTH DAILY   Fe Fum-FePoly-Vit C-Vit B3 (INTEGRA)  62.5-62.5-40-3 MG CAPS Take 1 capsule by mouth daily.   gabapentin (NEURONTIN) 300 MG capsule Take 1 capsule (300 mg total) by mouth at bedtime.   magnesium oxide (MAG-OX) 400 MG tablet Take 1 tablet (400 mg total) by mouth daily.   metoprolol tartrate (LOPRESSOR) 25 MG tablet Take 1 tablet (25 mg total) by mouth 2 (two) times daily.   omeprazole (PRILOSEC) 20 MG capsule Take 1 capsule (20 mg total) by mouth daily.   Polyethyl Glycol-Propyl Glycol (SYSTANE ULTRA OP) Place 1 drop into both eyes every 6 (six) hours as needed (dry eyes).   venlafaxine XR (EFFEXOR XR) 37.5 MG 24 hr capsule Take 1 capsule (37.5 mg total) by mouth daily with breakfast.   No facility-administered encounter medications on file as of 09/17/2022.     Lab Results  Component Value Date   WBC 6.8 09/11/2022   HGB 14.2 09/11/2022   HCT 41.8 09/11/2022   PLT 237.0 09/11/2022   GLUCOSE 93 09/11/2022   CHOL 192 09/11/2022   TRIG 251.0 (H) 09/11/2022   HDL 49.90 09/11/2022   LDLDIRECT 113.0 09/11/2022   LDLCALC 111 (H) 03/19/2021   ALT 15 09/11/2022   AST 15 09/11/2022   NA 140 09/11/2022   K 4.4 09/11/2022   CL 102 09/11/2022   CREATININE 0.71 09/11/2022   BUN 12 09/11/2022   CO2 29 09/11/2022   TSH 3.42 04/23/2022    MM DIAG BREAST TOMO UNI LEFT  Result Date: 05/14/2022 CLINICAL DATA:  Screening recall for a possible left breast asymmetry. EXAM: DIGITAL DIAGNOSTIC UNILATERAL LEFT MAMMOGRAM WITH TOMOSYNTHESIS TECHNIQUE: Left digital diagnostic mammography and breast tomosynthesis was performed. COMPARISON:  Previous exam(s). ACR Breast Density Category b: There are scattered areas of fibroglandular density. FINDINGS: On the diagnostic spot compression imaging, the possible asymmetry noted on the current screening left breast cc view disperses consistent with superimposed fibroglandular tissue. There is no underlying mass or significant residual asymmetry. IMPRESSION: No evidence of breast malignancy.  RECOMMENDATION: Screening mammogram in one year.(Code:[id number]-B-01Y) I have discussed the findings and recommendations with the patient. If applicable, a reminder letter will be sent to the patient regarding the next appointment. BI-RADS CATEGORY  1: Negative. Electronically Signed   By: Amie Portland M.D.   On: 05/14/2022 13:55       Assessment & Plan:  There are no diagnoses linked to this encounter.   [place], MD

## 2022-09-18 ENCOUNTER — Encounter: Payer: Self-pay | Admitting: Internal Medicine

## 2022-09-18 NOTE — Progress Notes (Signed)
Patient ID: Brianna [name], female   DOB: 1970-10-02, 52 y.o.   MRN: [id number] Did not show for appt.

## 2022-10-28 ENCOUNTER — Other Ambulatory Visit: Payer: Self-pay | Admitting: Internal Medicine

## 2022-12-15 ENCOUNTER — Encounter (INDEPENDENT_AMBULATORY_CARE_PROVIDER_SITE_OTHER): Payer: Self-pay

## 2022-12-20 ENCOUNTER — Other Ambulatory Visit: Payer: Self-pay | Admitting: Obstetrics and Gynecology

## 2022-12-20 DIAGNOSIS — N951 Menopausal and female climacteric states: Secondary | ICD-10-CM

## 2022-12-31 ENCOUNTER — Encounter: Payer: Self-pay | Admitting: Obstetrics and Gynecology

## 2022-12-31 ENCOUNTER — Ambulatory Visit (INDEPENDENT_AMBULATORY_CARE_PROVIDER_SITE_OTHER): Payer: 59 | Admitting: Obstetrics and Gynecology

## 2022-12-31 VITALS — BP 135/93 | HR 63 | Ht 64.0 in | Wt 210.4 lb

## 2022-12-31 DIAGNOSIS — Z01419 Encounter for gynecological examination (general) (routine) without abnormal findings: Secondary | ICD-10-CM | POA: Diagnosis not present

## 2022-12-31 DIAGNOSIS — N951 Menopausal and female climacteric states: Secondary | ICD-10-CM

## 2022-12-31 MED ORDER — ESTRADIOL 1 MG PO TABS
1.5000 mg | ORAL_TABLET | Freq: Every day | ORAL | 3 refills | Status: AC
Start: 2022-12-31 — End: ?

## 2022-12-31 NOTE — Progress Notes (Signed)
Patients presents for annual exam today. She states doing well with her IUD and HRT, would like to continue. Up to date on pap smear, mammography and lab work. She states no other questions or concerns at this time.

## 2022-12-31 NOTE — Progress Notes (Signed)
HPI:      Brianna [name] is a 52 y.o. [id number] who LMP was No LMP recorded. (Menstrual status: IUD).  Subjective:   She presents today for her annual examination.  She has an IUD for endometrial protection and takes 1.5 mg of Estrace daily.  She reports that she feels great on this regimen.  She says she feels better than she has in 5 years.  She reports no vaginal bleeding.  She is up-to-date on mammography and Pap smears.  Her PCP orders mammograms near the beginning of each year.    Hx: The following portions of the patient's history were reviewed and updated as appropriate:             She  has a past medical history of Anemia, Chicken pox, Depression, GERD (gastroesophageal reflux disease), Iron deficiency anemia (06/17/2018), Jaundice due to hepatitis, and Restless leg syndrome. She does not have any pertinent problems on file. She  has a past surgical history that includes Cholecystectomy (2003); Cesarean section [phone]); Colonoscopy with propofol (N/A, 08/22/2019); Esophagogastroduodenoscopy (egd) with propofol (N/A, 08/22/2019); Hernia repair; and Hysteroscopy with D & C (N/A, 08/28/2020). Her family history includes Alcohol abuse in her father; Arthritis in her father and maternal grandmother; Breast cancer (age of onset: 9) in her paternal aunt; COPD in her father; Early death in her mother; Heart disease in her mother; Hyperlipidemia in her father, maternal grandfather, and mother; Hypertension in her father and mother; Kidney disease in her father; Stroke in her maternal grandmother. She  reports that she has quit smoking. She has never used smokeless tobacco. She reports current alcohol use of about 7.0 standard drinks of alcohol per week. She reports that she does not use drugs. She has a current medication list which includes the following prescription(s): acetaminophen, buspirone, butalbital-acetaminophen-caffeine, integra, gabapentin, magnesium oxide, metoprolol tartrate,  omeprazole, polyethyl glycol-propyl glycol, venlafaxine xr, and estradiol. She is allergic to hydrocodone.       Review of Systems:  Review of Systems  Constitutional: Denied constitutional symptoms, night sweats, recent illness, fatigue, fever, insomnia and weight loss.  Eyes: Denied eye symptoms, eye pain, photophobia, vision change and visual disturbance.  Ears/Nose/Throat/Neck: Denied ear, nose, throat or neck symptoms, hearing loss, nasal discharge, sinus congestion and sore throat.  Cardiovascular: Denied cardiovascular symptoms, arrhythmia, chest pain/pressure, edema, exercise intolerance, orthopnea and palpitations.  Respiratory: Denied pulmonary symptoms, asthma, pleuritic pain, productive sputum, cough, dyspnea and wheezing.  Gastrointestinal: Denied, gastro-esophageal reflux, melena, nausea and vomiting.  Genitourinary: Denied genitourinary symptoms including symptomatic vaginal discharge, pelvic relaxation issues, and urinary complaints.  Musculoskeletal: Denied musculoskeletal symptoms, stiffness, swelling, muscle weakness and myalgia.  Dermatologic: Denied dermatology symptoms, rash and scar.  Neurologic: Denied neurology symptoms, dizziness, headache, neck pain and syncope.  Psychiatric: Denied psychiatric symptoms, anxiety and depression.  Endocrine: Denied endocrine symptoms including hot flashes and night sweats.   Meds:   Current Outpatient Medications on File Prior to Visit  Medication Sig Dispense Refill   acetaminophen (TYLENOL) 500 MG tablet Take 1 tablet (500 mg total) by mouth every 6 (six) hours. 60 tablet 0   busPIRone (BUSPAR) 10 MG tablet TAKE ONE TABLET BY MOUTH THREE TIMES A DAY 90 tablet 5   butalbital-acetaminophen-caffeine (FIORICET) 50-325-40 MG tablet Take 1 tablet by mouth 2 (two) times daily as needed for headache. 20 tablet 0   Fe Fum-FePoly-Vit C-Vit B3 (INTEGRA) 62.5-62.5-40-3 MG CAPS Take 1 capsule by mouth daily. 90 capsule 1   gabapentin  (NEURONTIN) 300 MG  capsule Take 1 capsule (300 mg total) by mouth at bedtime. 90 capsule 2   magnesium oxide (MAG-OX) 400 MG tablet Take 1 tablet (400 mg total) by mouth daily. 90 tablet 3   metoprolol tartrate (LOPRESSOR) 25 MG tablet Take 1 tablet (25 mg total) by mouth 2 (two) times daily. 180 tablet 1   omeprazole (PRILOSEC) 20 MG capsule Take 1 capsule (20 mg total) by mouth daily. 90 capsule 3   Polyethyl Glycol-Propyl Glycol (SYSTANE ULTRA OP) Place 1 drop into both eyes every 6 (six) hours as needed (dry eyes).     venlafaxine XR (EFFEXOR-XR) 37.5 MG 24 hr capsule TAKE 1 CAPSULE BY MOUTH DAILY WITH BREAKFAST 90 capsule 1   No current facility-administered medications on file prior to visit.     Objective:     Vitals:   12/31/22 1020  BP: (!) 135/93  Pulse: 63    Filed Weights   12/31/22 1020  Weight: 210 lb 6.4 oz (95.4 kg)                Assessment:    [id number] Patient Active Problem List   Diagnosis Date Noted   Leg pain 04/25/2022   Low back pain 04/23/2022   Health care maintenance 03/14/2022   Weight loss counseling, encounter for 10/27/2021   Cough 08/27/2021   Essential hypertension, benign 03/24/2021   Leukocytosis 03/19/2021   Headache 03/19/2021   Abnormal uterine bleeding    Postmenopausal bleeding    Hiatal hernia 06/20/2020   Stress 02/19/2020   Dysphagia    Barrett's esophagus without dysplasia    Stomach irritation    Weight gain 07/17/2019   Varicose veins of leg with pain, bilateral 07/17/2019   GERD (gastroesophageal reflux disease) 06/05/2019   Menstrual changes 06/05/2019   Encounter for screening colonoscopy 06/05/2019   Sweating increase 06/05/2019   Leiomyoma 06/29/2018   Iron deficiency anemia 06/17/2018   Restless leg 05/26/2018     1. Well woman exam with routine gynecological exam   2. Symptomatic menopausal or female climacteric states     She has no issues at this time.  Doing well with ERT and IUD for endometrial  protection.   Plan:            1.  Basic Screening Recommendations The basic screening recommendations for asymptomatic women were discussed with the patient during her visit.  The age-appropriate recommendations were discussed with her and the rational for the tests reviewed.  When I am informed by the patient that another primary care physician has previously obtained the age-appropriate tests and they are up-to-date, only outstanding tests are ordered and referrals given as necessary.  Abnormal results of tests will be discussed with her when all of her results are completed.  Routine preventative health maintenance measures emphasized: Exercise/Diet/Weight control, Tobacco Warnings, Alcohol/Substance use risks and Stress Management Mammogram early 2025 2.  Continue ERT.  Orders No orders of the defined types were placed in this encounter.    Meds ordered this encounter  Medications   estradiol (ESTRACE) 1 MG tablet    Sig: Take 1.5 tablets (1.5 mg total) by mouth daily.    Dispense:  135 tablet    Refill:  3          F/U  Return in about 1 year (around 12/31/2023) for Annual Physical.  Elonda Husky, M.D. 12/31/2022 10:37 AM

## 2023-01-18 ENCOUNTER — Other Ambulatory Visit: Payer: Self-pay | Admitting: Obstetrics and Gynecology

## 2023-01-18 DIAGNOSIS — F32A Depression, unspecified: Secondary | ICD-10-CM

## 2023-02-16 ENCOUNTER — Other Ambulatory Visit: Payer: Self-pay | Admitting: Internal Medicine

## 2023-04-17 ENCOUNTER — Other Ambulatory Visit: Payer: Self-pay | Admitting: Internal Medicine

## 2023-04-23 ENCOUNTER — Other Ambulatory Visit: Payer: Self-pay

## 2023-04-23 MED ORDER — VENLAFAXINE HCL ER 37.5 MG PO CP24: 37.5000 mg | ORAL_CAPSULE | Freq: Every day | ORAL | 1 refills | Status: DC

## 2023-05-16 ENCOUNTER — Other Ambulatory Visit: Payer: Self-pay | Admitting: Internal Medicine

## 2023-06-20 ENCOUNTER — Other Ambulatory Visit: Payer: Self-pay | Admitting: Internal Medicine

## 2023-06-24 NOTE — Telephone Encounter (Signed)
 I have sent in rx for metoprolol and gabapentin for one month. Needs a f/u appt before next refill.

## 2023-07-23 ENCOUNTER — Other Ambulatory Visit: Payer: Self-pay | Admitting: Internal Medicine

## 2023-07-23 ENCOUNTER — Other Ambulatory Visit: Payer: Self-pay | Admitting: Obstetrics and Gynecology

## 2023-07-23 DIAGNOSIS — F419 Anxiety disorder, unspecified: Secondary | ICD-10-CM

## 2023-07-26 ENCOUNTER — Other Ambulatory Visit: Payer: Self-pay | Admitting: Internal Medicine

## 2023-07-27 NOTE — Telephone Encounter (Signed)
 I refilled the metoprolol x 1 month. Needs a f/u appt scheduled.

## 2023-07-27 NOTE — Telephone Encounter (Signed)
 Rx ok'd for gabapentin #30 with no refills. See other rx message. Needs f/u appt

## 2023-08-19 ENCOUNTER — Other Ambulatory Visit: Payer: Self-pay | Admitting: Internal Medicine

## 2023-08-19 DIAGNOSIS — K2270 Barrett's esophagus without dysplasia: Secondary | ICD-10-CM

## 2023-08-24 ENCOUNTER — Other Ambulatory Visit: Payer: Self-pay | Admitting: Internal Medicine

## 2023-08-26 ENCOUNTER — Telehealth: Payer: Self-pay

## 2023-08-26 DIAGNOSIS — D509 Iron deficiency anemia, unspecified: Secondary | ICD-10-CM

## 2023-08-26 DIAGNOSIS — I10 Essential (primary) hypertension: Secondary | ICD-10-CM

## 2023-08-26 NOTE — Telephone Encounter (Signed)
 Received refill requests for Gabapentin  and metoprolol .  Pt last seen 09/17/22.  Called pt and schedule appointments for labs 08/28/23 for labs and with Dr. Geralyn Knee on 09/01/23.

## 2023-08-28 ENCOUNTER — Other Ambulatory Visit (INDEPENDENT_AMBULATORY_CARE_PROVIDER_SITE_OTHER)

## 2023-08-28 DIAGNOSIS — I10 Essential (primary) hypertension: Secondary | ICD-10-CM

## 2023-08-28 DIAGNOSIS — D509 Iron deficiency anemia, unspecified: Secondary | ICD-10-CM

## 2023-08-28 LAB — CBC WITH DIFFERENTIAL/PLATELET
Basophils Absolute: 0.1 10*3/uL (ref 0.0–0.1)
Basophils Relative: 1.1 % (ref 0.0–3.0)
Eosinophils Absolute: 0.1 10*3/uL (ref 0.0–0.7)
Eosinophils Relative: 1.9 % (ref 0.0–5.0)
HCT: 44 % (ref 36.0–46.0)
Hemoglobin: 15 g/dL (ref 12.0–15.0)
Lymphocytes Relative: 32.5 % (ref 12.0–46.0)
Lymphs Abs: 2.2 10*3/uL (ref 0.7–4.0)
MCHC: 34 g/dL (ref 30.0–36.0)
MCV: 104.2 fl — ABNORMAL HIGH (ref 78.0–100.0)
Monocytes Absolute: 0.4 10*3/uL (ref 0.1–1.0)
Monocytes Relative: 5.9 % (ref 3.0–12.0)
Neutro Abs: 3.9 10*3/uL (ref 1.4–7.7)
Neutrophils Relative %: 58.6 % (ref 43.0–77.0)
Platelets: 257 10*3/uL (ref 150.0–400.0)
RBC: 4.23 Mil/uL (ref 3.87–5.11)
RDW: 12.1 % (ref 11.5–15.5)
WBC: 6.7 10*3/uL (ref 4.0–10.5)

## 2023-08-28 LAB — LIPID PANEL
Cholesterol: 227 mg/dL — ABNORMAL HIGH (ref 0–200)
HDL: 54.8 mg/dL (ref >39.00)
LDL Cholesterol: 111 mg/dL — ABNORMAL HIGH (ref 0–99)
NonHDL: 171.98
Total CHOL/HDL Ratio: 4
Triglycerides: 304 mg/dL — ABNORMAL HIGH (ref 0.0–149.0)
VLDL: 60.8 mg/dL — ABNORMAL HIGH (ref 0.0–40.0)

## 2023-08-28 LAB — COMPREHENSIVE METABOLIC PANEL WITH GFR
ALT: 10 U/L (ref 0–35)
AST: 15 U/L (ref 0–37)
Albumin: 4.2 g/dL (ref 3.5–5.2)
Alkaline Phosphatase: 40 U/L (ref 39–117)
BUN: 12 mg/dL (ref 6–23)
CO2: 26 meq/L (ref 19–32)
Calcium: 8.7 mg/dL (ref 8.4–10.5)
Chloride: 101 meq/L (ref 96–112)
Creatinine, Ser: 0.59 mg/dL (ref 0.40–1.20)
GFR: 103.07 mL/min (ref >60.00)
Glucose, Bld: 85 mg/dL (ref 70–99)
Potassium: 4.2 meq/L (ref 3.5–5.1)
Sodium: 136 meq/L (ref 135–145)
Total Bilirubin: 0.5 mg/dL (ref 0.2–1.2)
Total Protein: 6.8 g/dL (ref 6.0–8.3)

## 2023-08-28 LAB — LDL CHOLESTEROL, DIRECT: Direct LDL: 139 mg/dL

## 2023-09-01 ENCOUNTER — Encounter: Payer: Self-pay | Admitting: Internal Medicine

## 2023-09-01 ENCOUNTER — Ambulatory Visit (INDEPENDENT_AMBULATORY_CARE_PROVIDER_SITE_OTHER): Admitting: Internal Medicine

## 2023-09-01 VITALS — BP 122/70 | HR 70 | Temp 97.8°F | Ht 64.0 in | Wt 180.4 lb

## 2023-09-01 DIAGNOSIS — Z1231 Encounter for screening mammogram for malignant neoplasm of breast: Secondary | ICD-10-CM | POA: Diagnosis not present

## 2023-09-01 DIAGNOSIS — F439 Reaction to severe stress, unspecified: Secondary | ICD-10-CM

## 2023-09-01 DIAGNOSIS — E7800 Pure hypercholesterolemia, unspecified: Secondary | ICD-10-CM | POA: Diagnosis not present

## 2023-09-01 DIAGNOSIS — I10 Essential (primary) hypertension: Secondary | ICD-10-CM

## 2023-09-01 DIAGNOSIS — G2581 Restless legs syndrome: Secondary | ICD-10-CM

## 2023-09-01 DIAGNOSIS — K2270 Barrett's esophagus without dysplasia: Secondary | ICD-10-CM | POA: Diagnosis not present

## 2023-09-01 DIAGNOSIS — D509 Iron deficiency anemia, unspecified: Secondary | ICD-10-CM

## 2023-09-01 DIAGNOSIS — K219 Gastro-esophageal reflux disease without esophagitis: Secondary | ICD-10-CM | POA: Diagnosis not present

## 2023-09-01 MED ORDER — CITALOPRAM HYDROBROMIDE 10 MG PO TABS: 10.0000 mg | ORAL_TABLET | Freq: Every day | ORAL | 2 refills | Status: DC

## 2023-09-01 NOTE — Patient Instructions (Signed)
 Taper the effexor  as discussed.   Start citalopram 10mg  per day.

## 2023-09-01 NOTE — Progress Notes (Signed)
 Subjective:    Patient ID: Brianna [name], female    DOB: April 08, 1971, 53 y.o.   MRN: [id number]  Patient here for  Chief Complaint  Patient presents with   Medical Management of Chronic Issues    HPI Here for a scheduled follow up - follow up regarding increased stress and hypertension. Have not seen since 07/2022. Was started on effexor  at that time. Saw gyn 12/31/22 - annual exam. Has an IUD and takes estrace  daily. Still having issues with hot flashes and night sweats. Also increased stress and anxiety. Discussed. Does not feel effexor  is helping. Discussed other treatment options. Has buspar  to take. Discussed stress. Increased work stress. Stays actve. Breathing stable. No abdominal pain or bowel change reported.    Past Medical History:  Diagnosis Date   Anemia    Chicken pox    Depression    GERD (gastroesophageal reflux disease)    Iron deficiency anemia 06/17/2018   Jaundice due to hepatitis    Restless leg syndrome    Past Surgical History:  Procedure Laterality Date   CESAREAN SECTION  [phone]   CHOLECYSTECTOMY  2003   COLONOSCOPY WITH PROPOFOL  N/A 08/22/2019   Procedure: COLONOSCOPY WITH PROPOFOL ;  Surgeon: Irby Mannan, MD;  Location: [facility];  Service: Endoscopy;  Laterality: N/A;   ESOPHAGOGASTRODUODENOSCOPY (EGD) WITH PROPOFOL  N/A 08/22/2019   Procedure: ESOPHAGOGASTRODUODENOSCOPY (EGD) WITH PROPOFOL ;  Surgeon: Irby Mannan, MD;  Location: [facility] ENDOSCOPY;  Service: Endoscopy;  Laterality: N/A;   HERNIA REPAIR     HYSTEROSCOPY WITH D & C N/A 08/28/2020   Procedure: DILATATION AND CURETTAGE /HYSTEROSCOPY;  Surgeon: Heron Lord, MD;  Location: [facility] ORS;  Service: Gynecology;  Laterality: N/A;   Family History  Problem Relation Age of Onset   Early death Mother    Heart disease Mother    Hyperlipidemia Mother    Hypertension Mother    Alcohol abuse Father    Arthritis Father    COPD Father    Hyperlipidemia Father     Hypertension Father    Kidney disease Father    Arthritis Maternal Grandmother    Stroke Maternal Grandmother    Hyperlipidemia Maternal Grandfather    Breast cancer Paternal Aunt 75   Social History   Socioeconomic History   Marital status: Married    Spouse name: Not on file   Number of children: Not on file   Years of education: Not on file   Highest education level: Not on file  Occupational History   Not on file  Tobacco Use   Smoking status: Former   Smokeless tobacco: Never  Vaping Use   Vaping status: Never Used  Substance and Sexual Activity   Alcohol use: Yes    Alcohol/week: 7.0 standard drinks of alcohol    Types: 7 Glasses of wine per week    Comment: occasional    Drug use: Never   Sexual activity: Yes    Birth control/protection: I.U.D., Post-menopausal  Other Topics Concern   Not on file  Social History Narrative   Not on file   Social Drivers of Health   Financial Resource Strain: Low Risk  (02/05/2021)   Overall Financial Resource Strain (CARDIA)    Difficulty of Paying Living Expenses: Not hard at all  Food Insecurity: No Food Insecurity (06/20/2020)   Received from [facility]   Hunger Vital Sign    Worried About Running Out of Food in the Last Year: Never true  Ran Out of Food in the Last Year: Never true  Transportation Needs: Not on file  Physical Activity: Inactive (11/29/2019)   Exercise Vital Sign    Days of Exercise per Week: 0 days    Minutes of Exercise per Session: 0 min  Stress: No Stress Concern Present (11/29/2019)   [employer] of Occupational Health - Occupational Stress Questionnaire    Feeling of Stress : Only a little  Social Connections: Unknown (09/03/2021)   Received from [facility]   Social Network    Social Network: Not on file     Review of Systems  Constitutional:  Negative for fever.       No unexpected weight change. Watching her diet. Has lost weight.   HENT:  Negative  for congestion and sinus pressure.   Respiratory:  Negative for cough, chest tightness and shortness of breath.   Cardiovascular:  Negative for chest pain, palpitations and leg swelling.  Gastrointestinal:  Negative for abdominal pain, diarrhea, nausea and vomiting.  Genitourinary:  Negative for difficulty urinating and dysuria.  Musculoskeletal:  Negative for joint swelling and myalgias.  Skin:  Negative for color change and rash.  Neurological:  Negative for dizziness and headaches.  Psychiatric/Behavioral:         Increased stress and anxiety as outlined.        Objective:     BP 122/70   Pulse 70   Temp 97.8 F (36.6 C)   Ht 5\' 4"  (1.626 m)   Wt 180 lb 6.4 oz (81.8 kg)   SpO2 97%   BMI 30.97 kg/m  Wt Readings from Last 3 Encounters:  09/01/23 180 lb 6.4 oz (81.8 kg)  12/31/22 210 lb 6.4 oz (95.4 kg)  08/01/22 213 lb (96.6 kg)    Physical Exam Vitals reviewed.  Constitutional:      General: She is not in acute distress.    Appearance: Normal appearance.  HENT:     Head: Normocephalic and atraumatic.     Right Ear: External ear normal.     Left Ear: External ear normal.     Mouth/Throat:     Pharynx: No oropharyngeal exudate or posterior oropharyngeal erythema.  Eyes:     General: No scleral icterus.       Right eye: No discharge.        Left eye: No discharge.     Conjunctiva/sclera: Conjunctivae normal.  Neck:     Thyroid : No thyromegaly.  Cardiovascular:     Rate and Rhythm: Normal rate and regular rhythm.  Pulmonary:     Effort: No respiratory distress.     Breath sounds: Normal breath sounds. No wheezing.  Abdominal:     General: Bowel sounds are normal.     Palpations: Abdomen is soft.     Tenderness: There is no abdominal tenderness.  Musculoskeletal:        General: No swelling or tenderness.     Cervical back: Neck supple. No tenderness.  Lymphadenopathy:     Cervical: No cervical adenopathy.  Skin:    Findings: No erythema or rash.   Neurological:     Mental Status: She is alert.  Psychiatric:        Mood and Affect: Mood normal.        Behavior: Behavior normal.         Outpatient Encounter Medications as of 09/01/2023  Medication Sig   acetaminophen  (TYLENOL ) 500 MG tablet Take 1 tablet (500 mg total) by mouth  every 6 (six) hours.   busPIRone  (BUSPAR ) 10 MG tablet TAKE ONE TABLET BY MOUTH THREE TIMES A DAY   butalbital -acetaminophen -caffeine  (FIORICET) 50-325-40 MG tablet Take 1 tablet by mouth 2 (two) times daily as needed for headache.   citalopram  (CELEXA ) 10 MG tablet Take 1 tablet (10 mg total) by mouth daily.   estradiol  (ESTRACE ) 1 MG tablet Take 1.5 tablets (1.5 mg total) by mouth daily.   Fe Fum-FePoly-Vit C-Vit B3 (INTEGRA) 62.5-62.5-40-3 MG CAPS TAKE 1 CAPSULE BY MOUTH DAILY   magnesium  oxide (MAG-OX) 400 MG tablet Take 1 tablet (400 mg total) by mouth daily.   metoprolol  tartrate (LOPRESSOR ) 25 MG tablet TAKE 1 TABLET BY MOUTH 2 TIMES A DAY   omeprazole  (PRILOSEC) 20 MG capsule TAKE 1 CAPSULE BY MOUTH DAILY   Polyethyl Glycol-Propyl Glycol (SYSTANE ULTRA OP) Place 1 drop into both eyes every 6 (six) hours as needed (dry eyes).   venlafaxine  XR (EFFEXOR -XR) 37.5 MG 24 hr capsule Take 1 capsule (37.5 mg total) by mouth daily with breakfast.   [DISCONTINUED] gabapentin  (NEURONTIN ) 300 MG capsule TAKE 1 CAPSULE BY MOUTH AT BEDTIME   No facility-administered encounter medications on file as of 09/01/2023.     Lab Results  Component Value Date   WBC 6.7 08/28/2023   HGB 15.0 08/28/2023   HCT 44.0 08/28/2023   PLT 257.0 08/28/2023   GLUCOSE 85 08/28/2023   CHOL 227 (H) 08/28/2023   TRIG 304.0 (H) 08/28/2023   HDL 54.80 08/28/2023   LDLDIRECT 139.0 08/28/2023   LDLCALC 111 (H) 08/28/2023   ALT 10 08/28/2023   AST 15 08/28/2023   NA 136 08/28/2023   K 4.2 08/28/2023   CL 101 08/28/2023   CREATININE 0.59 08/28/2023   BUN 12 08/28/2023   CO2 26 08/28/2023   TSH 3.42 04/23/2022    MM DIAG  BREAST TOMO UNI LEFT Result Date: 05/14/2022 CLINICAL DATA:  Screening recall for a possible left breast asymmetry. EXAM: DIGITAL DIAGNOSTIC UNILATERAL LEFT MAMMOGRAM WITH TOMOSYNTHESIS TECHNIQUE: Left digital diagnostic mammography and breast tomosynthesis was performed. COMPARISON:  Previous exam(s). ACR Breast Density Category b: There are scattered areas of fibroglandular density. FINDINGS: On the diagnostic spot compression imaging, the possible asymmetry noted on the current screening left breast cc view disperses consistent with superimposed fibroglandular tissue. There is no underlying mass or significant residual asymmetry. IMPRESSION: No evidence of breast malignancy. RECOMMENDATION: Screening mammogram in one year.(Code:SM-B-01Y) I have discussed the findings and recommendations with the patient. If applicable, a reminder letter will be sent to the patient regarding the next appointment. BI-RADS CATEGORY  1: Negative. Electronically Signed   By: Amanda Jungling M.D.   On: 05/14/2022 13:55       Assessment & Plan:  Essential hypertension, benign Assessment & Plan: Continue metoprolol . Pressure as outlined. No changes in blood pressure medication today. Follow pressures. Follow metabolic panel.    Hypercholesterolemia Assessment & Plan: The 10-year ASCVD risk score (Arnett DK, et al., 2019) is: 2.9%   Values used to calculate the score:     Age: 89 years     Sex: Female     Is Non-Hispanic African American: No     Diabetic: No     Tobacco smoker: No     Systolic Blood Pressure: 135 mmHg     Is BP treated: Yes     HDL Cholesterol: 54.8 mg/dL     Total Cholesterol: 227 mg/dL  Continue diet and exercise. Follow lipid panel.    Encounter  for screening mammogram for malignant neoplasm of breast -     3D Screening Mammogram, Left and Right; Future  Stress Assessment & Plan: Increased stress/anxiety as outlined. Discussed. Will taper off effexor . Start citalopram  as dircted. Follow.   Schedule soon f/u to reassess. Has buspar  to take.    Restless leg Assessment & Plan: Continue gabapentin .    Iron deficiency anemia, unspecified iron deficiency anemia type Assessment & Plan: Has seen hematology previously. Follow cbc and iron studies.    Gastroesophageal reflux disease, unspecified whether esophagitis present Assessment & Plan: No upper symptoms reported. Continue omeprazole .    Barrett's esophagus without dysplasia Assessment & Plan: Followed by GI.  Continue prilosec.  No upper symptoms reported.    Other orders -     Citalopram  Hydrobromide; Take 1 tablet (10 mg total) by mouth daily.  Dispense: 30 tablet; Refill: 2     Dellar Fenton, MD

## 2023-09-01 NOTE — Assessment & Plan Note (Addendum)
 The 10-year ASCVD risk score (Arnett DK, et al., 2019) is: 2.9%   Values used to calculate the score:     Age: 53 years     Sex: Female     Is Non-Hispanic African American: No     Diabetic: No     Tobacco smoker: No     Systolic Blood Pressure: 135 mmHg     Is BP treated: Yes     HDL Cholesterol: 54.8 mg/dL     Total Cholesterol: 227 mg/dL  Continue diet and exercise. Follow lipid panel.

## 2023-09-02 ENCOUNTER — Other Ambulatory Visit: Payer: Self-pay | Admitting: Internal Medicine

## 2023-09-07 NOTE — Assessment & Plan Note (Signed)
Followed by GI.  Continue prilosec.  No upper symptoms reported.  

## 2023-09-07 NOTE — Assessment & Plan Note (Signed)
 Continue gabapentin.

## 2023-09-07 NOTE — Assessment & Plan Note (Signed)
 Increased stress/anxiety as outlined. Discussed. Will taper off effexor . Start citalopram  as dircted. Follow.  Schedule soon f/u to reassess. Has buspar  to take.

## 2023-09-07 NOTE — Assessment & Plan Note (Signed)
No upper symptoms reported.  Continue omeprazole.

## 2023-09-07 NOTE — Assessment & Plan Note (Signed)
 Continue metoprolol . Pressure as outlined. No changes in blood pressure medication today. Follow pressures. Follow metabolic panel.

## 2023-09-07 NOTE — Assessment & Plan Note (Signed)
Has seen hematology previously.  Follow cbc and iron studies.   

## 2023-09-27 ENCOUNTER — Other Ambulatory Visit: Payer: Self-pay | Admitting: Internal Medicine

## 2023-09-27 DIAGNOSIS — K2270 Barrett's esophagus without dysplasia: Secondary | ICD-10-CM

## 2023-09-28 ENCOUNTER — Encounter: Payer: Self-pay | Admitting: Internal Medicine

## 2023-09-29 NOTE — Telephone Encounter (Signed)
 Please call and confirm current symptoms. She was reporting hot flashes at her last appt. We tapered her off effexor  and started citalopram . Need to confirm what she is taking now and current symptoms.

## 2023-09-30 NOTE — Telephone Encounter (Signed)
 LMTCB

## 2023-10-07 NOTE — Telephone Encounter (Signed)
 Please call and see if she is agreeable to increase citalopram  dose. I started her on citalopram  10mg  q day. Have her increase to 20mg  q day. Will need new rx.

## 2023-10-08 MED ORDER — CITALOPRAM HYDROBROMIDE 20 MG PO TABS: 20.0000 mg | ORAL_TABLET | Freq: Every day | ORAL | 1 refills | Status: DC

## 2023-10-08 NOTE — Telephone Encounter (Signed)
 Spoke to pt. Pt in agreement with increase. Meds sent in to [facility] 30 day supply with 1 refill. Informed pt to let us  know how she does with new dosage

## 2023-10-20 ENCOUNTER — Ambulatory Visit
Admission: RE | Admit: 2023-10-20 | Discharge: 2023-10-20 | Disposition: A | Discharge status: Home / Self Care | Source: Ambulatory Visit | Attending: Internal Medicine | Admitting: Internal Medicine

## 2023-10-20 DIAGNOSIS — Z1231 Encounter for screening mammogram for malignant neoplasm of breast: Secondary | ICD-10-CM | POA: Diagnosis not present

## 2023-10-21 ENCOUNTER — Other Ambulatory Visit: Payer: Self-pay | Admitting: Internal Medicine

## 2023-10-24 ENCOUNTER — Other Ambulatory Visit: Payer: Self-pay | Admitting: Internal Medicine

## 2023-10-24 ENCOUNTER — Other Ambulatory Visit: Payer: Self-pay | Admitting: Obstetrics and Gynecology

## 2023-10-24 DIAGNOSIS — F419 Anxiety disorder, unspecified: Secondary | ICD-10-CM

## 2023-10-28 ENCOUNTER — Encounter: Payer: Self-pay | Admitting: Internal Medicine

## 2023-10-28 DIAGNOSIS — R635 Abnormal weight gain: Secondary | ICD-10-CM

## 2023-10-28 DIAGNOSIS — Z713 Dietary counseling and surveillance: Secondary | ICD-10-CM

## 2023-10-28 NOTE — Telephone Encounter (Signed)
 If she has been off for two months, I would recommend her starting the 2.5mg  dose and then can titrate up. See her request for where to send prescription.

## 2023-10-28 NOTE — Telephone Encounter (Signed)
 Please refuse. Pt is now taking citalopram .

## 2023-10-29 ENCOUNTER — Telehealth: Payer: Self-pay

## 2023-10-29 MED ORDER — ZEPBOUND 2.5 MG/0.5ML ~~LOC~~ SOAJ
2.5000 mg | SUBCUTANEOUS | 0 refills | Status: DC
Start: 2023-10-29 — End: 2023-11-20

## 2023-10-29 NOTE — Telephone Encounter (Signed)
 Pharmacy Patient Advocate Encounter   Received notification from CoverMyMeds that prior authorization for Zepbound  2.5MG /0.5ML pen-injectors is required/requested.   Insurance verification completed.   The patient is insured through  [employer] .   Per test claim: PA required; PA submitted to above mentioned insurance via CoverMyMeds Key/confirmation #/EOC [id number] Status is pending

## 2023-11-03 ENCOUNTER — Ambulatory Visit: Admitting: Internal Medicine

## 2023-11-03 NOTE — Telephone Encounter (Signed)
 Pharmacy Patient Advocate Encounter  Received notification from [employer]  that Prior Authorization for  Zepbound  2.5MG /0.5ML pen-injectors  has been DENIED.  Full denial letter will be uploaded to the media tab. See denial reason below.   PA #/Case ID/Reference #: [id number]

## 2023-11-04 NOTE — Telephone Encounter (Signed)
 Called and spoke to pt and she stated that she tired Saxenda  and Ozempic  for like a year, but she could not take it due to Gi issues.

## 2023-11-05 NOTE — Telephone Encounter (Signed)
 Please let her know - has been denied. Can discuss with her more during her upcoming appt.

## 2023-11-05 NOTE — Telephone Encounter (Signed)
 Can we resubmit PA for tirzepatide  since patient tried and failed ozempic 

## 2023-11-18 ENCOUNTER — Other Ambulatory Visit: Payer: Self-pay | Admitting: Internal Medicine

## 2023-11-19 ENCOUNTER — Telehealth: Payer: Self-pay | Admitting: Pharmacist

## 2023-11-19 ENCOUNTER — Encounter: Payer: Self-pay | Admitting: Oncology

## 2023-11-19 ENCOUNTER — Other Ambulatory Visit (HOSPITAL_COMMUNITY): Payer: Self-pay

## 2023-11-19 NOTE — Telephone Encounter (Signed)
 An appeal can be submitted once the patient has been seen in the office for a current weight, as the insurance requires the weight to be documented within the past 45 days.    Thank you, Devere Pandy, PharmD Clinical Pharmacist  [place]  Direct Dial: [phone]

## 2023-11-19 NOTE — Telephone Encounter (Signed)
 Information has been sent to clinical pharmacist for appeals review. It may take 5-7 days to prepare the necessary documentation to request the appeal from the insurance.   Morene Potters, CPhT Supervisor Pharmacy Patient Advocate [facility] [phone] (Ph) 11/19/2023 9:37 AM

## 2023-11-20 ENCOUNTER — Ambulatory Visit (INDEPENDENT_AMBULATORY_CARE_PROVIDER_SITE_OTHER): Admitting: Internal Medicine

## 2023-11-20 VITALS — BP 122/70 | HR 64 | Resp 16 | Ht 64.0 in | Wt 190.6 lb

## 2023-11-20 DIAGNOSIS — F439 Reaction to severe stress, unspecified: Secondary | ICD-10-CM | POA: Diagnosis not present

## 2023-11-20 DIAGNOSIS — K2270 Barrett's esophagus without dysplasia: Secondary | ICD-10-CM | POA: Diagnosis not present

## 2023-11-20 DIAGNOSIS — M25551 Pain in right hip: Secondary | ICD-10-CM | POA: Diagnosis not present

## 2023-11-20 DIAGNOSIS — K219 Gastro-esophageal reflux disease without esophagitis: Secondary | ICD-10-CM | POA: Diagnosis not present

## 2023-11-20 DIAGNOSIS — E7800 Pure hypercholesterolemia, unspecified: Secondary | ICD-10-CM

## 2023-11-20 DIAGNOSIS — I10 Essential (primary) hypertension: Secondary | ICD-10-CM | POA: Diagnosis not present

## 2023-11-20 NOTE — Telephone Encounter (Signed)
 Pt has appt today

## 2023-11-20 NOTE — Progress Notes (Signed)
 Subjective:    Patient ID: Brianna [name], female    DOB: Oct 27, 1970, 53 y.o.   MRN: [id number]  Patient here for  Chief Complaint  Patient presents with   Medical Management of Chronic Issues    HPI Here for a scheduled follow up - follow up regarding increased stress and hypertension. Last visit, discussed increased stress. Started on citalopram  and tapered off effexor . Also taking buspar . Citalopram  has helped hot flashes.  Also sleeping better. She is working out in the am. Increased stress. Discussed. Work stress and family medical stress. Taking buspar . Discussed taking on a scheduled regimen. Discussed counseling. Breathing stable. No abdominal pain or bowel change noted. Does report right hip pain. Can occur lying / standing. Discussed antiinflammatory medication.    Past Medical History:  Diagnosis Date   Anemia    Chicken pox    Depression    GERD (gastroesophageal reflux disease)    Iron deficiency anemia 06/17/2018   Jaundice due to hepatitis    Restless leg syndrome    Past Surgical History:  Procedure Laterality Date   CESAREAN SECTION  [phone]   CHOLECYSTECTOMY  2003   COLONOSCOPY WITH PROPOFOL  N/A 08/22/2019   Procedure: COLONOSCOPY WITH PROPOFOL ;  Surgeon: Janalyn Keene NOVAK, MD;  Location: [facility];  Service: Endoscopy;  Laterality: N/A;   ESOPHAGOGASTRODUODENOSCOPY (EGD) WITH PROPOFOL  N/A 08/22/2019   Procedure: ESOPHAGOGASTRODUODENOSCOPY (EGD) WITH PROPOFOL ;  Surgeon: Janalyn Keene NOVAK, MD;  Location: [facility];  Service: Endoscopy;  Laterality: N/A;   HERNIA REPAIR     HYSTEROSCOPY WITH D & C N/A 08/28/2020   Procedure: DILATATION AND CURETTAGE /HYSTEROSCOPY;  Surgeon: Victor Claudell SAUNDERS, MD;  Location: [facility];  Service: Gynecology;  Laterality: N/A;   Family History  Problem Relation Age of Onset   Early death Mother    Heart disease Mother    Hyperlipidemia Mother    Hypertension Mother    Alcohol abuse Father    Arthritis Father     COPD Father    Hyperlipidemia Father    Hypertension Father    Kidney disease Father    Arthritis Maternal Grandmother    Stroke Maternal Grandmother    Hyperlipidemia Maternal Grandfather    Breast cancer Paternal Aunt 32   Social History   Socioeconomic History   Marital status: Married    Spouse name: Not on file   Number of children: Not on file   Years of education: Not on file   Highest education level: Associate degree: occupational, Scientist, product/process development, or vocational program  Occupational History   Not on file  Tobacco Use   Smoking status: Former   Smokeless tobacco: Never  Vaping Use   Vaping status: Never Used  Substance and Sexual Activity   Alcohol use: Yes    Alcohol/week: 7.0 standard drinks of alcohol    Types: 7 Glasses of wine per week    Comment: occasional    Drug use: Never   Sexual activity: Yes    Birth control/protection: I.U.D., Post-menopausal  Other Topics Concern   Not on file  Social History Narrative   Not on file   Social Drivers of Health   Financial Resource Strain: Low Risk  (11/20/2023)   Overall Financial Resource Strain (CARDIA)    Difficulty of Paying Living Expenses: Not hard at all  Food Insecurity: No Food Insecurity (11/20/2023)   Hunger Vital Sign    Worried About Running Out of Food in the Last Year: Never true    Ran Out  of Food in the Last Year: Never true  Transportation Needs: No Transportation Needs (11/20/2023)   PRAPARE - Administrator, Civil Service (Medical): No    Lack of Transportation (Non-Medical): No  Physical Activity: Sufficiently Active (11/20/2023)   Exercise Vital Sign    Days of Exercise per Week: 4 days    Minutes of Exercise per Session: 50 min  Stress: Stress Concern Present (11/20/2023)   [employer] of Occupational Health - Occupational Stress Questionnaire    Feeling of Stress: Very much  Social Connections: Socially Integrated (11/20/2023)   Social Connection and Isolation Panel     Frequency of Communication with Friends and Family: More than three times a week    Frequency of Social Gatherings with Friends and Family: More than three times a week    Attends Religious Services: More than 4 times per year    Active Member of [employer] or Organizations: Yes    Attends Banker Meetings: 1 to 4 times per year    Marital Status: Married     Review of Systems  Constitutional:  Negative for appetite change and unexpected weight change.  HENT:  Negative for congestion and sinus pressure.   Respiratory:  Negative for cough, chest tightness and shortness of breath.   Cardiovascular:  Negative for chest pain, palpitations and leg swelling.  Gastrointestinal:  Negative for abdominal pain, nausea and vomiting.  Genitourinary:  Negative for difficulty urinating and dysuria.  Musculoskeletal:  Negative for joint swelling and myalgias.       Right hip pain as outlined.   Skin:  Negative for color change and rash.  Neurological:  Negative for dizziness and headaches.  Psychiatric/Behavioral:  Negative for agitation and dysphoric mood.        Increased stress as outlined.        Objective:     BP 122/70   Pulse 64   Resp 16   Ht 5' 4 (1.626 m)   Wt 190 lb 9.6 oz (86.5 kg)   SpO2 98%   BMI 32.72 kg/m  Wt Readings from Last 3 Encounters:  11/20/23 190 lb 9.6 oz (86.5 kg)  09/01/23 180 lb 6.4 oz (81.8 kg)  12/31/22 210 lb 6.4 oz (95.4 kg)    Physical Exam Vitals reviewed.  Constitutional:      General: She is not in acute distress.    Appearance: Normal appearance.  HENT:     Head: Normocephalic and atraumatic.     Right Ear: External ear normal.     Left Ear: External ear normal.     Mouth/Throat:     Pharynx: No oropharyngeal exudate or posterior oropharyngeal erythema.  Eyes:     General: No scleral icterus.       Right eye: No discharge.        Left eye: No discharge.     Conjunctiva/sclera: Conjunctivae normal.  Neck:     Thyroid : No  thyromegaly.  Cardiovascular:     Rate and Rhythm: Normal rate and regular rhythm.  Pulmonary:     Effort: No respiratory distress.     Breath sounds: Normal breath sounds. No wheezing.  Abdominal:     General: Bowel sounds are normal.     Palpations: Abdomen is soft.     Tenderness: There is no abdominal tenderness.  Musculoskeletal:        General: No swelling.     Cervical back: Neck supple. No tenderness.     Comments: Increased  tenderness - right lateral hip.   Lymphadenopathy:     Cervical: No cervical adenopathy.  Skin:    Findings: No erythema or rash.  Neurological:     Mental Status: She is alert.  Psychiatric:        Mood and Affect: Mood normal.        Behavior: Behavior normal.         Outpatient Encounter Medications as of 11/20/2023  Medication Sig   acetaminophen  (TYLENOL ) 500 MG tablet Take 1 tablet (500 mg total) by mouth every 6 (six) hours.   busPIRone  (BUSPAR ) 10 MG tablet TAKE ONE TABLET BY MOUTH THREE TIMES A DAY   butalbital -acetaminophen -caffeine  (FIORICET) 50-325-40 MG tablet Take 1 tablet by mouth 2 (two) times daily as needed for headache.   citalopram  (CELEXA ) 20 MG tablet Take 1 tablet (20 mg total) by mouth daily.   estradiol  (ESTRACE ) 1 MG tablet Take 1.5 tablets (1.5 mg total) by mouth daily.   Fe Fum-FePoly-Vit C-Vit B3 (INTEGRA) 62.5-62.5-40-3 MG CAPS TAKE 1 CAPSULE BY MOUTH DAILY   gabapentin  (NEURONTIN ) 300 MG capsule TAKE 1 CAPSULE BY MOUTH AT BEDTIME   magnesium  oxide (MAG-OX) 400 MG tablet Take 1 tablet (400 mg total) by mouth daily.   metoprolol  tartrate (LOPRESSOR ) 25 MG tablet TAKE 1 TABLET BY MOUTH 2 TIMES A DAY   omeprazole  (PRILOSEC) 20 MG capsule TAKE 1 CAPSULE BY MOUTH DAILY   Polyethyl Glycol-Propyl Glycol (SYSTANE ULTRA OP) Place 1 drop into both eyes every 6 (six) hours as needed (dry eyes).   [DISCONTINUED] tirzepatide  (ZEPBOUND ) 2.5 MG/0.5ML Pen Inject 2.5 mg into the skin once a week.   [DISCONTINUED] venlafaxine  XR  (EFFEXOR -XR) 37.5 MG 24 hr capsule Take 1 capsule (37.5 mg total) by mouth daily with breakfast.   No facility-administered encounter medications on file as of 11/20/2023.     Lab Results  Component Value Date   WBC 6.7 08/28/2023   HGB 15.0 08/28/2023   HCT 44.0 08/28/2023   PLT 257.0 08/28/2023   GLUCOSE 85 08/28/2023   CHOL 227 (H) 08/28/2023   TRIG 304.0 (H) 08/28/2023   HDL 54.80 08/28/2023   LDLDIRECT 139.0 08/28/2023   LDLCALC 111 (H) 08/28/2023   ALT 10 08/28/2023   AST 15 08/28/2023   NA 136 08/28/2023   K 4.2 08/28/2023   CL 101 08/28/2023   CREATININE 0.59 08/28/2023   BUN 12 08/28/2023   CO2 26 08/28/2023   TSH 3.42 04/23/2022    MM 3D SCREENING MAMMOGRAM BILATERAL BREAST Result Date: 10/22/2023 CLINICAL DATA:  Screening. EXAM: DIGITAL SCREENING BILATERAL MAMMOGRAM WITH TOMOSYNTHESIS AND CAD TECHNIQUE: Bilateral screening digital craniocaudal and mediolateral oblique mammograms were obtained. Bilateral screening digital breast tomosynthesis was performed. The images were evaluated with computer-aided detection. COMPARISON:  Previous exam(s). ACR Breast Density Category b: There are scattered areas of fibroglandular density. FINDINGS: There are no findings suspicious for malignancy. IMPRESSION: No mammographic evidence of malignancy. A result letter of this screening mammogram will be mailed directly to the patient. RECOMMENDATION: Screening mammogram in one year. (Code:SM-B-01Y) BI-RADS CATEGORY  1: Negative. Electronically Signed   By: Reyes Phi M.D.   On: 10/22/2023 12:33       Assessment & Plan:  Stress Assessment & Plan: Increased stress as outlined. Currently on citalopram  and off effexor . Taking buspar . Discussed taking scheduled tid. Continue citalopram . Discussed counseling. Call with update.    Hypercholesterolemia Assessment & Plan: The 10-year ASCVD risk score (Arnett DK, et al., 2019) is: 2.3%  Values used to calculate the score:     Age: 76 years      Clincally relevant sex: Female     Is Non-Hispanic African American: No     Diabetic: No     Tobacco smoker: No     Systolic Blood Pressure: 122 mmHg     Is BP treated: Yes     HDL Cholesterol: 54.8 mg/dL     Total Cholesterol: 227 mg/dL  Continue diet and exercise. Follow lipd panel.    Gastroesophageal reflux disease, unspecified whether esophagitis present Assessment & Plan: On omeprazole . No upper symptoms reported.    Essential hypertension, benign Assessment & Plan: Continue metoprolol . Blood pressure as outlined. Follow pressures. No changes today.    Barrett's esophagus without dysplasia Assessment & Plan: Has seen GI. On prilosec. No upper symptoms reported.    Right hip pain Assessment & Plan: Discussed - arthrtiis/bursitis. Stretches. Consider PT. Notify if desires furher intervention.       Allena Hamilton, MD

## 2023-11-23 ENCOUNTER — Encounter: Payer: Self-pay | Admitting: Internal Medicine

## 2023-11-23 DIAGNOSIS — M25551 Pain in right hip: Secondary | ICD-10-CM | POA: Insufficient documentation

## 2023-11-23 NOTE — Assessment & Plan Note (Signed)
 Continue metoprolol . Blood pressure as outlined. Follow pressures. No changes today.

## 2023-11-23 NOTE — Assessment & Plan Note (Signed)
On omeprazole.  No upper symptoms reported.   

## 2023-11-23 NOTE — Assessment & Plan Note (Signed)
 Discussed - arthrtiis/bursitis. Stretches. Consider PT. Notify if desires furher intervention.

## 2023-11-23 NOTE — Assessment & Plan Note (Signed)
 Increased stress as outlined. Currently on citalopram  and off effexor . Taking buspar . Discussed taking scheduled tid. Continue citalopram . Discussed counseling. Call with update.

## 2023-11-23 NOTE — Assessment & Plan Note (Signed)
 The 10-year ASCVD risk score (Arnett DK, et al., 2019) is: 2.3%   Values used to calculate the score:     Age: 53 years     Clincally relevant sex: Female     Is Non-Hispanic African American: No     Diabetic: No     Tobacco smoker: No     Systolic Blood Pressure: 122 mmHg     Is BP treated: Yes     HDL Cholesterol: 54.8 mg/dL     Total Cholesterol: 227 mg/dL  Continue diet and exercise. Follow lipd panel.

## 2023-11-23 NOTE — Assessment & Plan Note (Signed)
 Has seen GI. On prilosec. No upper symptoms reported.

## 2023-11-24 ENCOUNTER — Telehealth: Payer: Self-pay | Admitting: Pharmacist

## 2023-11-24 NOTE — Telephone Encounter (Signed)
 Appeal has been submitted for Zepbound  with the updated weight. Will advise when response is received, please be advised that most companies may take 30 days to make a decision. Appeal letter and supporting documentation have been faxed to insurance at [phone] on 11/24/2023 @1 :58 pm.  Thank you, Devere Pandy, PharmD Clinical Pharmacist  [place]  Direct Dial: [phone]

## 2023-11-27 NOTE — Telephone Encounter (Signed)
 Noted

## 2023-12-02 ENCOUNTER — Other Ambulatory Visit: Payer: Self-pay

## 2023-12-02 MED ORDER — ZEPBOUND 2.5 MG/0.5ML ~~LOC~~ SOAJ: 2.5000 mg | SUBCUTANEOUS | 2 refills | Status: DC

## 2023-12-02 NOTE — Telephone Encounter (Signed)
 Patient aware of below. New rx for 2.5 mg sent to [facility] per patient request.

## 2023-12-02 NOTE — Telephone Encounter (Signed)
 The appeal for Zepbound  has been approved by the insurance:    Thank you,  Devere Pandy, PharmD Clinical Pharmacist  [place]  Direct Dial: [phone]

## 2023-12-03 ENCOUNTER — Other Ambulatory Visit: Payer: Self-pay

## 2023-12-03 MED ORDER — ZEPBOUND 2.5 MG/0.5ML ~~LOC~~ SOAJ: 2.5000 mg | SUBCUTANEOUS | 2 refills | Status: DC

## 2023-12-03 NOTE — Telephone Encounter (Signed)
 Spoke with pharmacy and was advised that they cannot process prescription because it is saying prescriber cancelled PA. Patient cannot pick up her medication. Please help.

## 2023-12-04 ENCOUNTER — Other Ambulatory Visit (HOSPITAL_COMMUNITY): Payer: Self-pay

## 2023-12-07 ENCOUNTER — Other Ambulatory Visit (HOSPITAL_COMMUNITY): Payer: Self-pay

## 2023-12-07 ENCOUNTER — Encounter: Payer: Self-pay | Admitting: Internal Medicine

## 2023-12-08 ENCOUNTER — Other Ambulatory Visit (HOSPITAL_COMMUNITY): Payer: Self-pay

## 2023-12-08 NOTE — Telephone Encounter (Signed)
 I have resent the prescription twice

## 2023-12-08 NOTE — Telephone Encounter (Signed)
 They can't cancel prior auth are they meaning the medication was discontinued and you guys have to resend it?

## 2023-12-08 NOTE — Telephone Encounter (Signed)
 I tried to call pharmacy to check on script. They are currently closed. I will call back before EOD to see if they can give me some insight.

## 2023-12-09 ENCOUNTER — Other Ambulatory Visit: Payer: Self-pay | Admitting: Internal Medicine

## 2023-12-10 ENCOUNTER — Other Ambulatory Visit (HOSPITAL_COMMUNITY): Payer: Self-pay

## 2023-12-10 NOTE — Telephone Encounter (Signed)
Pt is aware,  

## 2023-12-10 NOTE — Telephone Encounter (Signed)
 Spoke to someone from [employer]. They were able to process Zepbound  for a $4 copay.

## 2023-12-10 NOTE — Telephone Encounter (Signed)
 Any updates?

## 2023-12-24 ENCOUNTER — Other Ambulatory Visit: Payer: Self-pay

## 2023-12-24 MED ORDER — ZEPBOUND 5 MG/0.5ML ~~LOC~~ SOAJ: 5.0000 mg | SUBCUTANEOUS | 2 refills | Status: DC

## 2023-12-24 MED ORDER — ZEPBOUND 5 MG/0.5ML ~~LOC~~ SOAJ: 5.0000 mg | SUBCUTANEOUS | 3 refills | Status: DC

## 2023-12-24 NOTE — Telephone Encounter (Signed)
Ok to increase to 5 mg dose

## 2023-12-30 ENCOUNTER — Other Ambulatory Visit (HOSPITAL_COMMUNITY): Payer: Self-pay

## 2024-01-06 ENCOUNTER — Other Ambulatory Visit (HOSPITAL_COMMUNITY): Payer: Self-pay

## 2024-01-06 NOTE — Telephone Encounter (Signed)
 Please submit PA for zepbound  5 mg.

## 2024-01-07 ENCOUNTER — Telehealth: Payer: Self-pay

## 2024-01-07 ENCOUNTER — Other Ambulatory Visit (HOSPITAL_COMMUNITY): Payer: Self-pay

## 2024-01-07 NOTE — Telephone Encounter (Signed)
 PA request has been Started. New Encounter has been or will be created for follow up. For additional info see Pharmacy Prior Auth telephone encounter from 01/07/2024.

## 2024-01-07 NOTE — Telephone Encounter (Signed)
 Pharmacy Patient Advocate Encounter   Received notification from Physician's Office that prior authorization for Zepbound  5MG /0.5ML pen-injectors is required/requested.   Insurance verification completed.   The patient is insured through [facility] .   Per test claim: PA required; PA submitted to above mentioned insurance via Latent Key/confirmation #/EOC [id number] Status is pending  *WAITING FOR QUESTIONS TO POPULATE*

## 2024-01-11 ENCOUNTER — Other Ambulatory Visit (HOSPITAL_COMMUNITY): Payer: Self-pay

## 2024-01-11 ENCOUNTER — Other Ambulatory Visit: Payer: Self-pay | Admitting: Internal Medicine

## 2024-01-11 NOTE — Telephone Encounter (Signed)
 Pharmacy Patient Advocate Encounter  Received notification from [facility] that Prior Authorization for  Zepbound  5MG /0.5ML pen-injectors  has been DENIED.  Full denial letter will be uploaded to the media tab. See denial reason below.  DRUG NOT COVERED/PLAN EXCLUSION- YOUR REQUEST WAS DENIED BECAUSE YOUR PRESCRIPTION BENEFIT DOES NOT COVER THE REQUESTED MEDICATION.  PA #/Case ID/Reference #: [id number]

## 2024-01-11 NOTE — Telephone Encounter (Signed)
 Can we do anything about this denial? She was just approved for the 2.5 mg, this was just a dose increase and her insurance has not changed.

## 2024-01-11 NOTE — Telephone Encounter (Signed)
 Patient's medicaid was covering the previous fill. When I process now it states the medicaid is listed as secondary insurance. It has to go through primary (which is denying) before medicaid will cover anything.

## 2024-01-12 NOTE — Telephone Encounter (Signed)
 Patient is aware.

## 2024-01-13 ENCOUNTER — Encounter: Payer: Self-pay | Admitting: Oncology

## 2024-01-31 ENCOUNTER — Other Ambulatory Visit: Payer: Self-pay | Admitting: Obstetrics and Gynecology

## 2024-01-31 DIAGNOSIS — F32A Depression, unspecified: Secondary | ICD-10-CM

## 2024-01-31 DIAGNOSIS — N951 Menopausal and female climacteric states: Secondary | ICD-10-CM

## 2024-02-16 ENCOUNTER — Encounter: Payer: Self-pay | Admitting: Oncology

## 2024-02-18 DIAGNOSIS — M7071 Other bursitis of hip, right hip: Secondary | ICD-10-CM | POA: Diagnosis not present

## 2024-02-18 DIAGNOSIS — M1711 Unilateral primary osteoarthritis, right knee: Secondary | ICD-10-CM | POA: Diagnosis not present

## 2024-02-18 DIAGNOSIS — M7061 Trochanteric bursitis, right hip: Secondary | ICD-10-CM | POA: Diagnosis not present

## 2024-02-19 ENCOUNTER — Encounter (INDEPENDENT_AMBULATORY_CARE_PROVIDER_SITE_OTHER): Payer: Self-pay

## 2024-02-20 ENCOUNTER — Encounter: Payer: Self-pay | Admitting: Internal Medicine

## 2024-02-20 DIAGNOSIS — M179 Osteoarthritis of knee, unspecified: Secondary | ICD-10-CM | POA: Insufficient documentation

## 2024-02-23 ENCOUNTER — Encounter: Payer: Self-pay | Admitting: Internal Medicine

## 2024-02-23 ENCOUNTER — Ambulatory Visit: Admitting: Internal Medicine

## 2024-02-23 VITALS — BP 138/94 | HR 66 | Temp 98.6°F | Ht 64.0 in | Wt 186.5 lb

## 2024-02-23 DIAGNOSIS — E7800 Pure hypercholesterolemia, unspecified: Secondary | ICD-10-CM

## 2024-02-23 DIAGNOSIS — R61 Generalized hyperhidrosis: Secondary | ICD-10-CM | POA: Diagnosis not present

## 2024-02-23 DIAGNOSIS — G2581 Restless legs syndrome: Secondary | ICD-10-CM

## 2024-02-23 DIAGNOSIS — Z01419 Encounter for gynecological examination (general) (routine) without abnormal findings: Secondary | ICD-10-CM

## 2024-02-23 DIAGNOSIS — K2270 Barrett's esophagus without dysplasia: Secondary | ICD-10-CM | POA: Diagnosis not present

## 2024-02-23 DIAGNOSIS — I10 Essential (primary) hypertension: Secondary | ICD-10-CM | POA: Diagnosis not present

## 2024-02-23 DIAGNOSIS — F439 Reaction to severe stress, unspecified: Secondary | ICD-10-CM

## 2024-02-23 DIAGNOSIS — D509 Iron deficiency anemia, unspecified: Secondary | ICD-10-CM

## 2024-02-23 DIAGNOSIS — D7589 Other specified diseases of blood and blood-forming organs: Secondary | ICD-10-CM

## 2024-02-23 DIAGNOSIS — Z136 Encounter for screening for cardiovascular disorders: Secondary | ICD-10-CM | POA: Diagnosis not present

## 2024-02-23 DIAGNOSIS — M25551 Pain in right hip: Secondary | ICD-10-CM | POA: Diagnosis not present

## 2024-02-23 MED ORDER — GABAPENTIN 300 MG PO CAPS: ORAL_CAPSULE | ORAL | 1 refills | Status: AC

## 2024-02-23 MED ORDER — INTEGRA 62.5-62.5-40-3 MG PO CAPS: 1.0000 | ORAL_CAPSULE | Freq: Every day | ORAL | 1 refills | Status: AC

## 2024-02-23 MED ORDER — OMEPRAZOLE 20 MG PO CPDR: 20.0000 mg | DELAYED_RELEASE_CAPSULE | Freq: Every day | ORAL | 1 refills | Status: DC

## 2024-02-23 MED ORDER — CITALOPRAM HYDROBROMIDE 20 MG PO TABS: 20.0000 mg | ORAL_TABLET | Freq: Every day | ORAL | 1 refills | Status: AC

## 2024-02-23 MED ORDER — METOPROLOL TARTRATE 25 MG PO TABS: 25.0000 mg | ORAL_TABLET | Freq: Two times a day (BID) | ORAL | 1 refills | Status: AC

## 2024-02-23 NOTE — Progress Notes (Addendum)
 Subjective:    Patient ID: Brianna [name], female    DOB: 09/27/1970, 53 y.o.   MRN: [id number]  Patient here for  Chief Complaint  Patient presents with   Medical Management of Chronic Issues    3 mth f/u    HPI Here for a scheduled follow up - follow up regarding increased stress and hypertension. Continues on citalopram  and buspar . Discussed. Overall feels she is handling things relatively well. Discussed increasing gabapentin  to 2 q hs. Continues on omeprazole . Had f/u with ortho 02/18/24 - trochanteric bursitis of the right hip and OA right knee. S/p cortisone injection and recommended to start mobic. Injection helped. Trying to stay active. No chest pain or sob reported. No abdominal pain or bowel change reported. Discussed calcium score.    Past Medical History:  Diagnosis Date   Anemia    Chicken pox    Depression    GERD (gastroesophageal reflux disease)    Iron deficiency anemia 06/17/2018   Jaundice due to hepatitis    Restless leg syndrome    Past Surgical History:  Procedure Laterality Date   CESAREAN SECTION  [phone]   CHOLECYSTECTOMY  2003   COLONOSCOPY WITH PROPOFOL  N/A 08/22/2019   Procedure: COLONOSCOPY WITH PROPOFOL ;  Surgeon: Janalyn Keene NOVAK, MD;  Location: [facility];  Service: Endoscopy;  Laterality: N/A;   ESOPHAGOGASTRODUODENOSCOPY (EGD) WITH PROPOFOL  N/A 08/22/2019   Procedure: ESOPHAGOGASTRODUODENOSCOPY (EGD) WITH PROPOFOL ;  Surgeon: Janalyn Keene NOVAK, MD;  Location: [facility];  Service: Endoscopy;  Laterality: N/A;   HERNIA REPAIR     HYSTEROSCOPY WITH D & C N/A 08/28/2020   Procedure: DILATATION AND CURETTAGE /HYSTEROSCOPY;  Surgeon: Victor Claudell SAUNDERS, MD;  Location: [facility];  Service: Gynecology;  Laterality: N/A;   Family History  Problem Relation Age of Onset   Early death Mother    Heart disease Mother    Hyperlipidemia Mother    Hypertension Mother    Alcohol abuse Father    Arthritis Father    COPD Father     Hyperlipidemia Father    Hypertension Father    Kidney disease Father    Arthritis Maternal Grandmother    Stroke Maternal Grandmother    Hyperlipidemia Maternal Grandfather    Breast cancer Paternal Aunt 61   Social History   Socioeconomic History   Marital status: Married    Spouse name: Not on file   Number of children: Not on file   Years of education: Not on file   Highest education level: 12th grade  Occupational History   Not on file  Tobacco Use   Smoking status: Former   Smokeless tobacco: Never  Vaping Use   Vaping status: Never Used  Substance and Sexual Activity   Alcohol use: Yes    Alcohol/week: 7.0 standard drinks of alcohol    Types: 7 Glasses of wine per week    Comment: occasional    Drug use: Never   Sexual activity: Yes    Birth control/protection: I.U.D., Post-menopausal  Other Topics Concern   Not on file  Social History Narrative   Not on file   Social Drivers of Health   Financial Resource Strain: Low Risk  (02/19/2024)   Overall Financial Resource Strain (CARDIA)    Difficulty of Paying Living Expenses: Not hard at all  Food Insecurity: No Food Insecurity (02/19/2024)   Hunger Vital Sign    Worried About Running Out of Food in the Last Year: Never true    Ran Out of  Food in the Last Year: Never true  Transportation Needs: No Transportation Needs (02/19/2024)   PRAPARE - Administrator, Civil Service (Medical): No    Lack of Transportation (Non-Medical): No  Physical Activity: Sufficiently Active (02/19/2024)   Exercise Vital Sign    Days of Exercise per Week: 4 days    Minutes of Exercise per Session: 60 min  Stress: Stress Concern Present (02/19/2024)   [employer] of Occupational Health - Occupational Stress Questionnaire    Feeling of Stress: To some extent  Social Connections: Socially Integrated (02/19/2024)   Social Connection and Isolation Panel    Frequency of Communication with Friends and Family: More than  three times a week    Frequency of Social Gatherings with Friends and Family: More than three times a week    Attends Religious Services: 1 to 4 times per year    Active Member of [employer] or Organizations: Yes    Attends Engineer, Structural: More than 4 times per year    Marital Status: Married     Review of Systems  Constitutional:  Negative for appetite change and unexpected weight change.  HENT:  Negative for congestion and sinus pressure.   Respiratory:  Negative for cough, chest tightness and shortness of breath.   Cardiovascular:  Negative for chest pain, palpitations and leg swelling.  Gastrointestinal:  Negative for abdominal pain, diarrhea, nausea and vomiting.  Genitourinary:  Negative for difficulty urinating and dysuria.  Musculoskeletal:  Negative for joint swelling and myalgias.       Right hip and right knee pain as outlined.   Skin:  Negative for color change and rash.  Neurological:  Negative for dizziness and headaches.  Psychiatric/Behavioral:  Negative for agitation and dysphoric mood.        Objective:     BP (!) 138/94   Pulse 66   Temp 98.6 F (37 C) (Oral)   Ht 5' 4 (1.626 m)   Wt 186 lb 8 oz (84.6 kg)   SpO2 97%   BMI 32.01 kg/m  Wt Readings from Last 3 Encounters:  02/23/24 186 lb 8 oz (84.6 kg)  11/20/23 190 lb 9.6 oz (86.5 kg)  09/01/23 180 lb 6.4 oz (81.8 kg)    Physical Exam Vitals reviewed.  Constitutional:      General: She is not in acute distress.    Appearance: Normal appearance.  HENT:     Head: Normocephalic and atraumatic.     Right Ear: External ear normal.     Left Ear: External ear normal.     Mouth/Throat:     Pharynx: No oropharyngeal exudate or posterior oropharyngeal erythema.  Eyes:     General: No scleral icterus.       Right eye: No discharge.        Left eye: No discharge.     Conjunctiva/sclera: Conjunctivae normal.  Neck:     Thyroid : No thyromegaly.  Cardiovascular:     Rate and Rhythm: Normal  rate and regular rhythm.  Pulmonary:     Effort: No respiratory distress.     Breath sounds: Normal breath sounds. No wheezing.  Abdominal:     General: Bowel sounds are normal.     Palpations: Abdomen is soft.     Tenderness: There is no abdominal tenderness.  Musculoskeletal:        General: No swelling or tenderness.     Cervical back: Neck supple. No tenderness.  Lymphadenopathy:  Cervical: No cervical adenopathy.  Skin:    Findings: No erythema or rash.  Neurological:     Mental Status: She is alert.  Psychiatric:        Mood and Affect: Mood normal.        Behavior: Behavior normal.         Outpatient Encounter Medications as of 02/23/2024  Medication Sig   acetaminophen  (TYLENOL ) 500 MG tablet Take 1 tablet (500 mg total) by mouth every 6 (six) hours.   busPIRone  (BUSPAR ) 10 MG tablet TAKE ONE TABLET BY MOUTH THREE TIMES A DAY   butalbital -acetaminophen -caffeine  (FIORICET) 50-325-40 MG tablet Take 1 tablet by mouth 2 (two) times daily as needed for headache.   estradiol  (ESTRACE ) 1 MG tablet Take 1.5 tablets (1.5 mg total) by mouth daily.   magnesium  oxide (MAG-OX) 400 MG tablet TAKE 1 TABLET BY MOUTH DAILY   meloxicam (MOBIC) 15 MG tablet Take 1 tablet every day by oral route with meal(s).   Polyethyl Glycol-Propyl Glycol (SYSTANE ULTRA OP) Place 1 drop into both eyes every 6 (six) hours as needed (dry eyes).   citalopram  (CELEXA ) 20 MG tablet Take 1 tablet (20 mg total) by mouth daily.   Fe Fum-FePoly-Vit C-Vit B3 (INTEGRA) 62.5-62.5-40-3 MG CAPS Take 1 capsule by mouth daily.   gabapentin  (NEURONTIN ) 300 MG capsule Take 2 capsules q hs.   metoprolol  tartrate (LOPRESSOR ) 25 MG tablet Take 1 tablet (25 mg total) by mouth 2 (two) times daily.   omeprazole  (PRILOSEC) 20 MG capsule Take 1 capsule (20 mg total) by mouth daily.   [DISCONTINUED] citalopram  (CELEXA ) 20 MG tablet TAKE 1 TABLET BY MOUTH DAILY   [DISCONTINUED] Fe Fum-FePoly-Vit C-Vit B3 (INTEGRA)  62.5-62.5-40-3 MG CAPS TAKE 1 CAPSULE BY MOUTH DAILY   [DISCONTINUED] gabapentin  (NEURONTIN ) 300 MG capsule TAKE 1 CAPSULE BY MOUTH AT BEDTIME   [DISCONTINUED] metoprolol  tartrate (LOPRESSOR ) 25 MG tablet TAKE 1 TABLET BY MOUTH 2 TIMES A DAY   [DISCONTINUED] omeprazole  (PRILOSEC) 20 MG capsule TAKE 1 CAPSULE BY MOUTH DAILY   [DISCONTINUED] tirzepatide  (ZEPBOUND ) 5 MG/0.5ML Pen Inject 5 mg into the skin once a week.   No facility-administered encounter medications on file as of 02/23/2024.     Lab Results  Component Value Date   WBC 6.7 08/28/2023   HGB 15.0 08/28/2023   HCT 44.0 08/28/2023   PLT 257.0 08/28/2023   GLUCOSE 85 08/28/2023   CHOL 227 (H) 08/28/2023   TRIG 304.0 (H) 08/28/2023   HDL 54.80 08/28/2023   LDLDIRECT 139.0 08/28/2023   LDLCALC 111 (H) 08/28/2023   ALT 10 08/28/2023   AST 15 08/28/2023   NA 136 08/28/2023   K 4.2 08/28/2023   CL 101 08/28/2023   CREATININE 0.59 08/28/2023   BUN 12 08/28/2023   CO2 26 08/28/2023   TSH 3.42 04/23/2022    MM 3D SCREENING MAMMOGRAM BILATERAL BREAST Result Date: 10/22/2023 CLINICAL DATA:  Screening. EXAM: DIGITAL SCREENING BILATERAL MAMMOGRAM WITH TOMOSYNTHESIS AND CAD TECHNIQUE: Bilateral screening digital craniocaudal and mediolateral oblique mammograms were obtained. Bilateral screening digital breast tomosynthesis was performed. The images were evaluated with computer-aided detection. COMPARISON:  Previous exam(s). ACR Breast Density Category b: There are scattered areas of fibroglandular density. FINDINGS: There are no findings suspicious for malignancy. IMPRESSION: No mammographic evidence of malignancy. A result letter of this screening mammogram will be mailed directly to the patient. RECOMMENDATION: Screening mammogram in one year. (Code:[id number]-B-01Y) BI-RADS CATEGORY  1: Negative. Electronically Signed   By: Reyes Grayson HERO.D.  On: 10/22/2023 12:33       Assessment & Plan:  Hypercholesterolemia Assessment & Plan: The  10-year ASCVD risk score (Arnett DK, et al., 2019) is: 3%   Values used to calculate the score:     Age: 82 years     Clincally relevant sex: Female     Is Non-Hispanic African American: No     Diabetic: No     Tobacco smoker: No     Systolic Blood Pressure: 138 mmHg     Is BP treated: Yes     HDL Cholesterol: 54.8 mg/dL     Total Cholesterol: 227 mg/dL  Continue diet and exercise. Follow lipd panel. Discussed calcium score. Agreeable.   Orders: -     Lipid panel; Future -     Comprehensive metabolic panel with GFR; Future -     TSH; Future  Barrett's esophagus without dysplasia Assessment & Plan: Has seen GI. On prilosec. No upper symptoms reported.    Essential hypertension, benign Assessment & Plan: Continue metoprolol . Blood pressure as outlined. Follow pressures. No change in medication today.   Orders: -     Comprehensive metabolic panel with GFR; Future  Iron deficiency anemia, unspecified iron deficiency anemia type Assessment & Plan: Has seen hematology previously. Follow cbc and iron studies.   Orders: -     CBC with Differential/Platelet; Future  Macrocytosis Assessment & Plan: Check B12 level.   Orders: -     Vitamin B12; Future  Encounter for screening for coronary artery disease -     CT CARDIAC SCORING (SELF PAY ONLY); Future  Restless leg Assessment & Plan: Continue gabapentin . Increase to 2 q hs. Follow.    Right hip pain Assessment & Plan: Saw ortho. Injection helped. Follow.    Stress Assessment & Plan: Increased stress as outlined. Currently on citalopram  and off effexor . Taking buspar . Continue citalopram . Increase gabapentin  follow.    Sweating increase Assessment & Plan: Increased sweating. Concerned regarding menopausal symptoms. Increase gabapentin  as outlined.   Orders: -     Ambulatory referral to Gynecology  Encounter for gynecological examination without abnormal finding -     Ambulatory referral to Gynecology  Other  orders -     Citalopram  Hydrobromide; Take 1 tablet (20 mg total) by mouth daily.  Dispense: 90 tablet; Refill: 1 -     Integra; Take 1 capsule by mouth daily.  Dispense: 90 capsule; Refill: 1 -     Gabapentin ; Take 2 capsules q hs.  Dispense: 180 capsule; Refill: 1 -     Metoprolol  Tartrate; Take 1 tablet (25 mg total) by mouth 2 (two) times daily.  Dispense: 180 tablet; Refill: 1 -     Omeprazole ; Take 1 capsule (20 mg total) by mouth daily.  Dispense: 90 capsule; Refill: 1     Allena Hamilton, MD

## 2024-02-28 ENCOUNTER — Encounter: Payer: Self-pay | Admitting: Internal Medicine

## 2024-02-28 NOTE — Assessment & Plan Note (Signed)
 Continue gabapentin . Increase to 2 q hs. Follow.

## 2024-02-28 NOTE — Assessment & Plan Note (Signed)
 Increased stress as outlined. Currently on citalopram  and off effexor . Taking buspar . Continue citalopram . Increase gabapentin  follow.

## 2024-02-28 NOTE — Assessment & Plan Note (Signed)
 Saw ortho. Injection helped. Follow.

## 2024-02-28 NOTE — Assessment & Plan Note (Signed)
 Check B12 level.

## 2024-02-28 NOTE — Assessment & Plan Note (Addendum)
 Increased sweating. Concerned regarding menopausal symptoms. Increase gabapentin  as outlined.

## 2024-02-28 NOTE — Addendum Note (Signed)
 Addended by: GLENDIA ALLENA RAMAN on: 02/28/2024 09:12 PM   Modules accepted: Orders

## 2024-02-28 NOTE — Assessment & Plan Note (Signed)
 Continue metoprolol . Blood pressure as outlined. Follow pressures. No change in medication today.

## 2024-02-28 NOTE — Assessment & Plan Note (Signed)
 Has seen GI. On prilosec. No upper symptoms reported.

## 2024-02-28 NOTE — Assessment & Plan Note (Signed)
 The 10-year ASCVD risk score (Arnett DK, et al., 2019) is: 3%   Values used to calculate the score:     Age: 53 years     Clincally relevant sex: Female     Is Non-Hispanic African American: No     Diabetic: No     Tobacco smoker: No     Systolic Blood Pressure: 138 mmHg     Is BP treated: Yes     HDL Cholesterol: 54.8 mg/dL     Total Cholesterol: 227 mg/dL  Continue diet and exercise. Follow lipd panel. Discussed calcium score. Agreeable.

## 2024-02-28 NOTE — Assessment & Plan Note (Signed)
Has seen hematology previously.  Follow cbc and iron studies.   

## 2024-03-01 ENCOUNTER — Ambulatory Visit
Admission: RE | Admit: 2024-03-01 | Discharge: 2024-03-01 | Disposition: A | Discharge status: Home / Self Care | Payer: Self-pay | Source: Ambulatory Visit | Attending: Internal Medicine | Admitting: Internal Medicine

## 2024-03-01 DIAGNOSIS — Z136 Encounter for screening for cardiovascular disorders: Secondary | ICD-10-CM | POA: Insufficient documentation

## 2024-03-02 ENCOUNTER — Ambulatory Visit: Payer: Self-pay | Admitting: Internal Medicine

## 2024-03-02 DIAGNOSIS — E7800 Pure hypercholesterolemia, unspecified: Secondary | ICD-10-CM

## 2024-03-02 DIAGNOSIS — R931 Abnormal findings on diagnostic imaging of heart and coronary circulation: Secondary | ICD-10-CM

## 2024-03-02 MED ORDER — ROSUVASTATIN CALCIUM 20 MG PO TABS: 20.0000 mg | ORAL_TABLET | Freq: Every day | ORAL | 1 refills | Status: AC

## 2024-03-03 NOTE — Telephone Encounter (Signed)
 Order placed for cardiology referral. Crestor sent in.

## 2024-03-15 ENCOUNTER — Ambulatory Visit: Attending: Cardiovascular Disease | Admitting: Cardiovascular Disease

## 2024-03-15 ENCOUNTER — Encounter: Payer: Self-pay | Admitting: Cardiovascular Disease

## 2024-03-15 VITALS — BP 128/94 | HR 73 | Ht 63.0 in | Wt 188.0 lb

## 2024-03-15 DIAGNOSIS — I10 Essential (primary) hypertension: Secondary | ICD-10-CM | POA: Diagnosis not present

## 2024-03-15 DIAGNOSIS — E7800 Pure hypercholesterolemia, unspecified: Secondary | ICD-10-CM

## 2024-03-15 DIAGNOSIS — I2510 Atherosclerotic heart disease of native coronary artery without angina pectoris: Secondary | ICD-10-CM | POA: Insufficient documentation

## 2024-03-15 NOTE — Patient Instructions (Addendum)
 Medication Instructions:  No changes  If you need a refill on your cardiac medications before your next appointment, please call your pharmacy.   Lab work: No new labs needed  Testing/Procedures: No new testing needed  Follow-Up: At [facility], you and your health needs are our priority.  As part of our continuing mission to provide you with exceptional heart care, we have created designated Provider Care Teams.  These Care Teams include your primary Cardiologist (physician) and Advanced Practice Providers (APPs -  Physician Assistants and Nurse Practitioners) who all work together to provide you with the care you need, when you need it.  Follow up as needed  Providers on your designated Care Team:   Laneta Pintos, NP Varney Gentleman, PA-C Cadence Furth, [place]  COVID-19 Vaccine Information can be found at: [email or web address] For questions related to vaccine distribution or appointments, please email [email or web address] or call [phone].

## 2024-03-15 NOTE — Progress Notes (Signed)
 Cardiology Office Note  Date:  03/15/2024   ID:  Brianna [name], DOB [birth year], MRN [id number]  PCP:  Glendia Shad, MD   Chief Complaint  Patient presents with   New Patient (Initial Visit)    Ref by Dr. Shad Glendia for elevated CT calcium  score. Patient c/o palpitations at times.     HPI:  Brianna [name] is a 53 y.o. female with past medical history of: Past Medical History:  Diagnosis Date   Anemia    Chicken pox    Depression    GERD (gastroesophageal reflux disease)    Iron deficiency anemia 06/17/2018   Jaundice due to hepatitis    Restless leg syndrome   Former smoker Who presents by referral from Dr. Shad Glendia for consultation of her elevated calcium  score, family history of cardiac disease  Given family history of cardiac disease she underwent calcium  scoring for risk stratification Coronary calcification March 01, 2024 Score 536 LM 0 LAD 344 LCx 67 RCA 125 Total 536 Images pulled up and reviewed on today's visit No significant plaque in the aorta Majority of plaque in the LAD, RCA secondary, minimal in the circumflex  Lab work reviewed Total cholesterol 227 LDL 111 Direct LDL 139 Now on Crestor  20 daily for the past week So far no side effects on Crestor  Triglycerides elevated more than 300  Active, starting exercise program, starting to eat better  EKG personally reviewed by myself on todays visit EKG Interpretation Date/Time:  Tuesday March 15 2024 15:18:48 EST Ventricular Rate:  73 PR Interval:  140 QRS Duration:  88 QT Interval:  390 QTC Calculation: 429 R Axis:   20  Text Interpretation: Normal sinus rhythm Normal ECG When compared with ECG of 20-Nov-2020 21:04, No significant change was found Confirmed by Perla Lye [phone]) on 03/15/2024 3:59:29 PM    PMH:   has a past medical history of Anemia, Chicken pox, Depression, GERD (gastroesophageal reflux disease), Iron deficiency anemia (06/17/2018), Jaundice due to hepatitis, and  Restless leg syndrome.   PSH:    Past Surgical History:  Procedure Laterality Date   CESAREAN SECTION  [phone]   CHOLECYSTECTOMY  2003   COLONOSCOPY WITH PROPOFOL  N/A 08/22/2019   Procedure: COLONOSCOPY WITH PROPOFOL ;  Surgeon: Janalyn Keene NOVAK, MD;  Location: [facility];  Service: Endoscopy;  Laterality: N/A;   ESOPHAGOGASTRODUODENOSCOPY (EGD) WITH PROPOFOL  N/A 08/22/2019   Procedure: ESOPHAGOGASTRODUODENOSCOPY (EGD) WITH PROPOFOL ;  Surgeon: Janalyn Keene NOVAK, MD;  Location: [facility];  Service: Endoscopy;  Laterality: N/A;   HERNIA REPAIR     HYSTEROSCOPY WITH D & C N/A 08/28/2020   Procedure: DILATATION AND CURETTAGE /HYSTEROSCOPY;  Surgeon: Victor Claudell SAUNDERS, MD;  Location: [facility];  Service: Gynecology;  Laterality: N/A;    Current Outpatient Medications  Medication Sig Dispense Refill   acetaminophen  (TYLENOL ) 500 MG tablet Take 1 tablet (500 mg total) by mouth every 6 (six) hours. 60 tablet 0   busPIRone  (BUSPAR ) 10 MG tablet TAKE ONE TABLET BY MOUTH THREE TIMES A DAY 270 tablet 0   butalbital -acetaminophen -caffeine  (FIORICET) 50-325-40 MG tablet Take 1 tablet by mouth 2 (two) times daily as needed for headache. 20 tablet 0   citalopram  (CELEXA ) 20 MG tablet Take 1 tablet (20 mg total) by mouth daily. 90 tablet 1   estradiol  (ESTRACE ) 1 MG tablet Take 1.5 tablets (1.5 mg total) by mouth daily. 135 tablet 3   Fe Fum-FePoly-Vit C-Vit B3 (INTEGRA) 62.5-62.5-40-3 MG CAPS Take 1 capsule by mouth daily. 90 capsule 1  gabapentin  (NEURONTIN ) 300 MG capsule Take 2 capsules q hs. 180 capsule 1   lansoprazole (PREVACID) 15 MG capsule Take 15 mg by mouth daily at 12 noon.     magnesium  oxide (MAG-OX) 400 MG tablet TAKE 1 TABLET BY MOUTH DAILY 90 tablet 3   meloxicam (MOBIC) 15 MG tablet Take 1 tablet every day by oral route with meal(s).     metoprolol  tartrate (LOPRESSOR ) 25 MG tablet Take 1 tablet (25 mg total) by mouth 2 (two) times daily. 180 tablet 1   Polyethyl  Glycol-Propyl Glycol (SYSTANE ULTRA OP) Place 1 drop into both eyes every 6 (six) hours as needed (dry eyes).     rosuvastatin  (CRESTOR ) 20 MG tablet Take 1 tablet (20 mg total) by mouth daily. 90 tablet 1   No current facility-administered medications for this visit.    Allergies:   Hydrocodone   Social History:  The patient  reports that she has quit smoking. She has never used smokeless tobacco. She reports current alcohol use of about 7.0 standard drinks of alcohol per week. She reports that she does not use drugs.   Family History:   family history includes Alcohol abuse in her father; Arthritis in her father and maternal grandmother; Breast cancer (age of onset: 42) in her paternal aunt; COPD in her father; Early death in her mother; Heart disease in her mother; Hyperlipidemia in her father, maternal grandfather, and mother; Hypertension in her father and mother; Kidney disease in her father; Stroke in her maternal grandmother.    Review of Systems: Review of Systems  Constitutional: Negative.   HENT: Negative.    Respiratory: Negative.    Cardiovascular: Negative.   Gastrointestinal: Negative.   Musculoskeletal: Negative.   Neurological: Negative.   Psychiatric/Behavioral: Negative.    All other systems reviewed and are negative.   PHYSICAL EXAM: VS:  BP (!) 128/94 (BP Location: Right Arm, Patient Position: Sitting, Cuff Size: Normal)   Pulse 73   Ht 5' 3 (1.6 m)   Wt 188 lb (85.3 kg)   SpO2 98%   BMI 33.30 kg/m  , BMI Body mass index is 33.3 kg/m. GEN: Well nourished, well developed, in no acute distress HEENT: normal Neck: no JVD, carotid bruits, or masses Cardiac: RRR; no murmurs, rubs, or gallops,no edema  Respiratory:  clear to auscultation bilaterally, normal work of breathing GI: soft, nontender, nondistended, + BS MS: no deformity or atrophy Skin: warm and dry, no rash Neuro:  Strength and sensation are intact Psych: euthymic mood, full affect  Recent  Labs: 08/28/2023: ALT 10; BUN 12; Creatinine, Ser 0.59; Hemoglobin 15.0; Platelets 257.0; Potassium 4.2; Sodium 136    Lipid Panel Lab Results  Component Value Date   CHOL 227 (H) 08/28/2023   HDL 54.80 08/28/2023   LDLCALC 111 (H) 08/28/2023   TRIG 304.0 (H) 08/28/2023      Wt Readings from Last 3 Encounters:  03/15/24 188 lb (85.3 kg)  02/23/24 186 lb 8 oz (84.6 kg)  11/20/23 190 lb 9.6 oz (86.5 kg)     ASSESSMENT AND PLAN:  Problem List Items Addressed This Visit       Cardiology Problems   Coronary artery calcification - Primary   Relevant Orders   EKG 12-Lead (Completed)   Essential hypertension, benign   Relevant Orders   EKG 12-Lead (Completed)   Hypercholesterolemia   Relevant Orders   EKG 12-Lead (Completed)   Coronary calcification Calcium  score greater than 500 Strong family history of cardiac disease  History of hyperlipidemia, prior smoking, continues to vape No history of diabetes, glucose numbers well-controlled on lab work - Denies chest pain or shortness of breath concerning for angina - Recommend she continue Crestor , repeat lipid panel in January 2026 with goal LDL less than 55 No indication for stress testing at this time as she is asymptomatic  Hyperlipidemia Recently started on Crestor  20 mg daily 1 week ago Would recommend lipid panel January 2026 If not continue to run high could add Zetia/bempedoic acid - Additional option would be to start Repatha  Essential hypertension Blood pressure mildly elevated, recommend close monitoring of diastolic pressure at home 90 on today's office visit Continue metoprolol   tartrate 25 twice daily  Patient seen in consultation for Dr. Glendia and will be referred back to her office for ongoing care of the issues detailed above  Signed, Velinda Lunger, M.D., Ph.D. [facility] [place], [place] [phone]

## 2024-03-22 ENCOUNTER — Other Ambulatory Visit

## 2024-03-22 DIAGNOSIS — E7800 Pure hypercholesterolemia, unspecified: Secondary | ICD-10-CM

## 2024-03-22 DIAGNOSIS — D509 Iron deficiency anemia, unspecified: Secondary | ICD-10-CM

## 2024-03-22 DIAGNOSIS — I10 Essential (primary) hypertension: Secondary | ICD-10-CM | POA: Diagnosis not present

## 2024-03-22 DIAGNOSIS — D7589 Other specified diseases of blood and blood-forming organs: Secondary | ICD-10-CM | POA: Diagnosis not present

## 2024-03-22 LAB — COMPREHENSIVE METABOLIC PANEL WITH GFR
ALT: 12 U/L (ref 0–35)
AST: 13 U/L (ref 0–37)
Albumin: 4.2 g/dL (ref 3.5–5.2)
Alkaline Phosphatase: 48 U/L (ref 39–117)
BUN: 12 mg/dL (ref 6–23)
CO2: 28 meq/L (ref 19–32)
Calcium: 8.9 mg/dL (ref 8.4–10.5)
Chloride: 99 meq/L (ref 96–112)
Creatinine, Ser: 0.6 mg/dL (ref 0.40–1.20)
GFR: 102.24 mL/min (ref >60.00)
Glucose, Bld: 93 mg/dL (ref 70–99)
Potassium: 4 meq/L (ref 3.5–5.1)
Sodium: 137 meq/L (ref 135–145)
Total Bilirubin: 0.9 mg/dL (ref 0.2–1.2)
Total Protein: 6.9 g/dL (ref 6.0–8.3)

## 2024-03-22 LAB — CBC WITH DIFFERENTIAL/PLATELET
Basophils Absolute: 0.1 K/uL (ref 0.0–0.1)
Basophils Relative: 0.7 % (ref 0.0–3.0)
Eosinophils Absolute: 0.1 K/uL (ref 0.0–0.7)
Eosinophils Relative: 0.7 % (ref 0.0–5.0)
HCT: 43.2 % (ref 36.0–46.0)
Hemoglobin: 14.9 g/dL (ref 12.0–15.0)
Lymphocytes Relative: 18.5 % (ref 12.0–46.0)
Lymphs Abs: 1.7 K/uL (ref 0.7–4.0)
MCHC: 34.6 g/dL (ref 30.0–36.0)
MCV: 101.5 fl — ABNORMAL HIGH (ref 78.0–100.0)
Monocytes Absolute: 0.7 K/uL (ref 0.1–1.0)
Monocytes Relative: 7.2 % (ref 3.0–12.0)
Neutro Abs: 6.8 K/uL (ref 1.4–7.7)
Neutrophils Relative %: 72.9 % (ref 43.0–77.0)
Platelets: 255 K/uL (ref 150.0–400.0)
RBC: 4.26 Mil/uL (ref 3.87–5.11)
RDW: 13.1 % (ref 11.5–15.5)
WBC: 9.4 K/uL (ref 4.0–10.5)

## 2024-03-22 LAB — LIPID PANEL
Cholesterol: 177 mg/dL (ref 0–200)
HDL: 84.4 mg/dL (ref >39.00)
LDL Cholesterol: 63 mg/dL (ref 0–99)
NonHDL: 92.14
Total CHOL/HDL Ratio: 2
Triglycerides: 147 mg/dL (ref 0.0–149.0)
VLDL: 29.4 mg/dL (ref 0.0–40.0)

## 2024-03-22 LAB — VITAMIN B12: Vitamin B-12: 181 pg/mL — ABNORMAL LOW (ref 211–911)

## 2024-03-22 LAB — TSH: TSH: 3.23 u[IU]/mL (ref 0.35–5.50)

## 2024-03-24 ENCOUNTER — Ambulatory Visit: Admitting: Internal Medicine

## 2024-03-24 ENCOUNTER — Encounter: Payer: Self-pay | Admitting: Internal Medicine

## 2024-03-24 VITALS — BP 126/80 | HR 78 | Temp 98.8°F | Ht 63.0 in | Wt 186.0 lb

## 2024-03-24 DIAGNOSIS — G8929 Other chronic pain: Secondary | ICD-10-CM | POA: Diagnosis not present

## 2024-03-24 DIAGNOSIS — D509 Iron deficiency anemia, unspecified: Secondary | ICD-10-CM

## 2024-03-24 DIAGNOSIS — I10 Essential (primary) hypertension: Secondary | ICD-10-CM | POA: Diagnosis not present

## 2024-03-24 DIAGNOSIS — F439 Reaction to severe stress, unspecified: Secondary | ICD-10-CM | POA: Diagnosis not present

## 2024-03-24 DIAGNOSIS — E538 Deficiency of other specified B group vitamins: Secondary | ICD-10-CM | POA: Diagnosis not present

## 2024-03-24 DIAGNOSIS — E7800 Pure hypercholesterolemia, unspecified: Secondary | ICD-10-CM | POA: Diagnosis not present

## 2024-03-24 DIAGNOSIS — K219 Gastro-esophageal reflux disease without esophagitis: Secondary | ICD-10-CM | POA: Diagnosis not present

## 2024-03-24 DIAGNOSIS — M545 Low back pain, unspecified: Secondary | ICD-10-CM | POA: Diagnosis not present

## 2024-03-24 DIAGNOSIS — R238 Other skin changes: Secondary | ICD-10-CM | POA: Diagnosis not present

## 2024-03-24 MED ORDER — CYANOCOBALAMIN 1000 MCG/ML IJ SOLN
1000.0000 ug | Freq: Once | INTRAMUSCULAR | Status: AC
  Administered 2024-03-24: 1000 ug via INTRAMUSCULAR

## 2024-03-24 MED ORDER — TIZANIDINE HCL 4 MG PO TABS: 4.0000 mg | ORAL_TABLET | Freq: Every evening | ORAL | 0 refills | Status: AC | PRN

## 2024-03-24 NOTE — Progress Notes (Signed)
 Pt presented for their vitamin B12 injection. Pt was identified through two identifiers. Pt tolerated shot well in their left deltoid.

## 2024-03-24 NOTE — Progress Notes (Unsigned)
 Subjective:    Patient ID: Brianna [name], female    DOB: Apr 30, 1970, 53 y.o.   MRN: [id number]  Patient here for  Chief Complaint  Patient presents with   Medical Management of Chronic Issues    Back pain, skin irritation     HPI Here for a scheduled follow up - follow up regarding increased stress and hypertension. Continues on citalopram  and buspar . Had f/u with ortho 02/18/24 - trochanteric bursitis of the right hip and OA right knee. S/p cortisone injection and recommended to start mobic. Injection helped. Saw cardiology 03/15/24 - elevated calcium  score - >500. On crestor . Felt no indication for stress testing. Reports increased back pain - low back pain. Noticed after picking up something heavy. Has been using heating pad and biofreeze patches. Taking meloxicam. Also reports skin irritation. Notices when something rubs against her skin - for example - loose clothes, etc. Skin very sensitive - burning sensation. Noticed a couple of weeks ago after washing her legs. No new exposures. No rash. No fever.    Past Medical History:  Diagnosis Date   Anemia    Chicken pox    Depression    GERD (gastroesophageal reflux disease)    Hypertension    Iron deficiency anemia 06/17/2018   Jaundice due to hepatitis    Restless leg syndrome    Past Surgical History:  Procedure Laterality Date   CESAREAN SECTION  [phone]   CHOLECYSTECTOMY  2003   COLONOSCOPY WITH PROPOFOL  N/A 08/22/2019   Procedure: COLONOSCOPY WITH PROPOFOL ;  Surgeon: Janalyn Keene NOVAK, MD;  Location: [facility];  Service: Endoscopy;  Laterality: N/A;   ESOPHAGOGASTRODUODENOSCOPY (EGD) WITH PROPOFOL  N/A 08/22/2019   Procedure: ESOPHAGOGASTRODUODENOSCOPY (EGD) WITH PROPOFOL ;  Surgeon: Janalyn Keene NOVAK, MD;  Location: [facility];  Service: Endoscopy;  Laterality: N/A;   HERNIA REPAIR     HYSTEROSCOPY WITH D & C N/A 08/28/2020   Procedure: DILATATION AND CURETTAGE /HYSTEROSCOPY;  Surgeon: Victor Claudell SAUNDERS,  MD;  Location: [facility];  Service: Gynecology;  Laterality: N/A;   Family History  Problem Relation Age of Onset   Early death Mother    Heart disease Mother    Hyperlipidemia Mother    Hypertension Mother    Alcohol abuse Father    Arthritis Father    COPD Father    Hyperlipidemia Father    Hypertension Father    Kidney disease Father    Breast cancer Paternal Aunt 71   Arthritis Maternal Grandmother    Stroke Maternal Grandmother    Hyperlipidemia Maternal Grandfather    Social History   Socioeconomic History   Marital status: Married    Spouse name: Not on file   Number of children: Not on file   Years of education: Not on file   Highest education level: 12th grade  Occupational History   Not on file  Tobacco Use   Smoking status: Former   Smokeless tobacco: Never  Vaping Use   Vaping status: Every Day   Substances: Nicotine, Flavoring  Substance and Sexual Activity   Alcohol use: Yes    Alcohol/week: 7.0 standard drinks of alcohol    Types: 7 Glasses of wine per week    Comment: occasional    Drug use: Never   Sexual activity: Yes    Birth control/protection: I.U.D., Post-menopausal  Other Topics Concern   Not on file  Social History Narrative   Not on file   Social Drivers of Health   Tobacco Use: Medium Risk (04/03/2024)  Patient History    Smoking Tobacco Use: Former    Smokeless Tobacco Use: Never    Passive Exposure: Not on file  Financial Resource Strain: Low Risk  (03/30/2024)   Received from [facility]   Overall Financial Resource Strain (CARDIA)    Difficulty of Paying Living Expenses: Not hard at all  Food Insecurity: No Food Insecurity (03/30/2024)   Received from [facility]   Epic    Within the past 12 months, you worried that your food would run out before you got the money to buy more.: Never true    Within the past 12 months, the food you bought just didn't last and you didn't have money to get  more.: Never true  Transportation Needs: No Transportation Needs (03/30/2024)   Received from [facility] - Transportation    In the past 12 months, has lack of transportation kept you from medical appointments or from getting medications?: No    Lack of Transportation (Non-Medical): No  Physical Activity: Sufficiently Active (02/19/2024)   Exercise Vital Sign    Days of Exercise per Week: 4 days    Minutes of Exercise per Session: 60 min  Stress: Stress Concern Present (02/19/2024)   [employer] - Occupational Stress Questionnaire    Feeling of Stress: To some extent  Social Connections: Socially Integrated (02/19/2024)   Social Connection and Isolation Panel    Frequency of Communication with Friends and Family: More than three times a week    Frequency of Social Gatherings with Friends and Family: More than three times a week    Attends Religious Services: 1 to 4 times per year    Active Member of Clubs or Organizations: Yes    Attends Banker Meetings: More than 4 times per year    Marital Status: Married  Depression (PHQ2-9): Medium Risk (03/24/2024)   Depression (PHQ2-9)    PHQ-2 Score: 7  Alcohol Screen: Low Risk (02/19/2024)   Alcohol Screen    Last Alcohol Screening Score (AUDIT): 4  Housing: Unknown (03/30/2024)   Received from [facility]   Epic    In the last 12 months, was there a time when you were not able to pay the mortgage or rent on time?: No    Number of Times Moved in the Last Year: Not on file    At any time in the past 12 months, were you homeless or living in a shelter (including now)?: No  Utilities: Not At Risk (03/30/2024)   Received from [facility]   Epic    In the past 12 months has the electric, gas, oil, or water company threatened to shut off services in your home?: No  Health Literacy: Not on file     Review of Systems  Constitutional:   Negative for appetite change and unexpected weight change.  HENT:  Negative for congestion and sinus pressure.   Respiratory:  Negative for cough, chest tightness and shortness of breath.   Cardiovascular:  Negative for chest pain, palpitations and leg swelling.  Gastrointestinal:  Negative for abdominal pain, diarrhea, nausea and vomiting.  Genitourinary:  Negative for difficulty urinating and dysuria.  Musculoskeletal:  Positive for back pain. Negative for myalgias.  Skin:  Negative for rash.       Skin sensitivity as outlined.   Neurological:  Negative for dizziness and headaches.  Psychiatric/Behavioral:  Increased stress.        Objective:     BP 126/80   Pulse 78   Temp 98.8 F (37.1 C) (Oral)   Ht 5' 3 (1.6 m)   Wt 186 lb (84.4 kg)   SpO2 97%   BMI 32.95 kg/m  Wt Readings from Last 3 Encounters:  03/24/24 186 lb (84.4 kg)  03/15/24 188 lb (85.3 kg)  02/23/24 186 lb 8 oz (84.6 kg)    Physical Exam Vitals reviewed.  Constitutional:      General: She is not in acute distress.    Appearance: Normal appearance.  HENT:     Head: Normocephalic and atraumatic.     Right Ear: External ear normal.     Left Ear: External ear normal.     Mouth/Throat:     Pharynx: No oropharyngeal exudate or posterior oropharyngeal erythema.  Eyes:     General: No scleral icterus.       Right eye: No discharge.        Left eye: No discharge.     Conjunctiva/sclera: Conjunctivae normal.  Neck:     Thyroid : No thyromegaly.  Cardiovascular:     Rate and Rhythm: Normal rate and regular rhythm.  Pulmonary:     Effort: No respiratory distress.     Breath sounds: Normal breath sounds. No wheezing.  Abdominal:     General: Bowel sounds are normal.     Palpations: Abdomen is soft.     Tenderness: There is no abdominal tenderness.  Musculoskeletal:        General: No swelling or tenderness.     Cervical back: Neck supple. No tenderness.  Lymphadenopathy:     Cervical: No  cervical adenopathy.  Skin:    Findings: No erythema or rash.     Comments: Increased sensitivity with ligh touch.   Neurological:     Mental Status: She is alert.  Psychiatric:        Mood and Affect: Mood normal.        Behavior: Behavior normal.         Outpatient Encounter Medications as of 03/24/2024  Medication Sig   acetaminophen  (TYLENOL ) 500 MG tablet Take 1 tablet (500 mg total) by mouth every 6 (six) hours.   busPIRone  (BUSPAR ) 10 MG tablet TAKE ONE TABLET BY MOUTH THREE TIMES A DAY   butalbital -acetaminophen -caffeine  (FIORICET) 50-325-40 MG tablet Take 1 tablet by mouth 2 (two) times daily as needed for headache.   citalopram  (CELEXA ) 20 MG tablet Take 1 tablet (20 mg total) by mouth daily.   estradiol  (ESTRACE ) 1 MG tablet Take 1.5 tablets (1.5 mg total) by mouth daily.   Fe Fum-FePoly-Vit C-Vit B3 (INTEGRA) 62.5-62.5-40-3 MG CAPS Take 1 capsule by mouth daily.   gabapentin  (NEURONTIN ) 300 MG capsule Take 2 capsules q hs.   lansoprazole (PREVACID) 15 MG capsule Take 15 mg by mouth daily at 12 noon.   magnesium  oxide (MAG-OX) 400 MG tablet TAKE 1 TABLET BY MOUTH DAILY   meloxicam (MOBIC) 15 MG tablet Take 1 tablet every day by oral route with meal(s).   metoprolol  tartrate (LOPRESSOR ) 25 MG tablet Take 1 tablet (25 mg total) by mouth 2 (two) times daily.   Polyethyl Glycol-Propyl Glycol (SYSTANE ULTRA OP) Place 1 drop into both eyes every 6 (six) hours as needed (dry eyes).   rosuvastatin  (CRESTOR ) 20 MG tablet Take 1 tablet (20 mg total) by mouth daily.   tiZANidine  (ZANAFLEX ) 4 MG tablet Take 1 tablet (4  mg total) by mouth at bedtime as needed for muscle spasms.   [EXPIRED] cyanocobalamin  (VITAMIN B12) injection 1,000 mcg    No facility-administered encounter medications on file as of 03/24/2024.     Lab Results  Component Value Date   WBC 9.4 03/22/2024   HGB 14.9 03/22/2024   HCT 43.2 03/22/2024   PLT 255.0 03/22/2024   GLUCOSE 93 03/22/2024   CHOL 177  03/22/2024   TRIG 147.0 03/22/2024   HDL 84.40 03/22/2024   LDLDIRECT 139.0 08/28/2023   LDLCALC 63 03/22/2024   ALT 12 03/22/2024   AST 13 03/22/2024   NA 137 03/22/2024   K 4.0 03/22/2024   CL 99 03/22/2024   CREATININE 0.60 03/22/2024   BUN 12 03/22/2024   CO2 28 03/22/2024   TSH 3.23 03/22/2024    CT CARDIAC SCORING Addendum Date: 03/03/2024 ADDENDUM REPORT: 03/03/2024 17:13 EXAM: OVER-READ INTERPRETATION  CT CHEST The following report is an over-read performed by radiologist Dr. Andrea Gasman of [facility] on 03/03/2024. This over-read does not include interpretation of cardiac or coronary anatomy or pathology. The coronary calcium  score interpretation by the cardiologist is attached. COMPARISON:  None. FINDINGS: Vascular: No aortic atherosclerosis. The included aorta is normal in caliber. Mediastinum/nodes: No adenopathy or mass. Unremarkable esophagus. Lungs: No focal airspace disease. No pulmonary nodule. No pleural fluid. The included airways are patent. Upper abdomen: No acute findings. Diffusely decreased hepatic density typical of steatosis. Musculoskeletal: There are no acute or suspicious osseous abnormalities. IMPRESSION: 1. No acute extracardiac findings. 2. Hepatic steatosis. Electronically Signed   By: Andrea Gasman M.D.   On: 03/03/2024 17:13   Result Date: 03/03/2024 CLINICAL DATA:  Risk stratification EXAM: Coronary Calcium  Score TECHNIQUE: The patient was scanned on a Siemens Somatom scanner. Axial non-contrast 3 mm slices were carried out through the heart. The data set was analyzed on a dedicated work station and scored using the Agatson method. FINDINGS: Non-cardiac: See separate report from [facility]. Ascending Aorta: Normal size.  Mild calcifications of aortic root. Pericardium: Normal Coronary arteries: Normal origin of left and right coronary arteries. Distribution of arterial calcifications if present, as noted below; LM 0 LAD 344 LCx  67 RCA 125 Total 536 IMPRESSION AND RECOMMENDATION: 1. Coronary calcium  score of 536. This was 99th percentile for age and sex matched control. CAC >300 in LAD, LCx, RCA. CAC-DRS A3/N3. CAC-DRS A0: CAC score 0: Statin generally not recommended unless other high risk condition (e.g., FH, DM2, tobacco use, etc.) CAC-DRS A1: CAC score 1-99: moderate intensity statin CAC-DRS A2: CAC score 100-299: moderate to high intensity statin + ASA 81mg  CAC-DRS A3: CAC score > 300: high intensity statin + ASA 81mg  Consider cardiology consultation. Continue heart healthy lifestyle and risk factor modification. Electronically Signed: By: Caron Poser On: 03/01/2024 16:59       Assessment & Plan:  B12 deficiency -     Cyanocobalamin   Hypercholesterolemia Assessment & Plan: The 10-year ASCVD risk score (Arnett DK, et al., 2019) is: 1.2%   Values used to calculate the score:     Age: 42 years     Clinically relevant sex: Female     Is Non-Hispanic African American: No     Diabetic: No     Tobacco smoker: No     Systolic Blood Pressure: 125 mmHg     Is BP treated: Yes     HDL Cholesterol: 84.4 mg/dL     Total Cholesterol: 177 mg/dL  Continue diet and exercise. Follow  lipid panel. Continues on crestor  - elevated calcium  score.    Essential hypertension, benign Assessment & Plan: Continue metoprolol . Blood pressure as outlined. Follow pressures. No change in medication today.    Iron deficiency anemia, unspecified iron deficiency anemia type Assessment & Plan: Has seen hematology previously. Follow cbc and iron studies.    Stress Assessment & Plan: Increased stress. Continue citalopram . Also has buspar . Follow.    Chronic midline low back pain without sciatica Assessment & Plan: Increased pain as outlined. Noticed increase recently with lifting. Already on mobic. Using biofreeze. Trial of tizanidine . Follow. Call with update.    Gastroesophageal reflux disease, unspecified whether esophagitis  present Assessment & Plan: On prevacid. No upper symptoms reported.    Skin sensitivity Assessment & Plan: Increased skin sensitivity. Discussed. Increased pain and irritation with light rubbing, movement etc. For example, loose clothes rubbing against the skin. Discussed treatment and possible etiologies. Unclear to exact cause. Already on gabapentin . Discussed possible increased dose. Follow.  Call with update.    Other orders -     tiZANidine  HCl; Take 1 tablet (4 mg total) by mouth at bedtime as needed for muscle spasms.  Dispense: 20 tablet; Refill: 0     Allena Hamilton, MD

## 2024-03-25 ENCOUNTER — Encounter: Payer: Self-pay | Admitting: Internal Medicine

## 2024-03-26 NOTE — Telephone Encounter (Signed)
 Called and discussed with Brianna [name] current symptoms. Sleeping ok. Taking two gabapentin  q hs. Will increase gabapentin  to 300mg  in am, 300mg  midday and two (300mg ) q hs. Follow.  Call with update of the next couple of days.

## 2024-03-30 ENCOUNTER — Ambulatory Visit

## 2024-03-30 DIAGNOSIS — Z01419 Encounter for gynecological examination (general) (routine) without abnormal findings: Secondary | ICD-10-CM | POA: Diagnosis not present

## 2024-03-30 DIAGNOSIS — E538 Deficiency of other specified B group vitamins: Secondary | ICD-10-CM | POA: Diagnosis not present

## 2024-03-30 DIAGNOSIS — Z1331 Encounter for screening for depression: Secondary | ICD-10-CM | POA: Diagnosis not present

## 2024-03-30 DIAGNOSIS — N950 Postmenopausal bleeding: Secondary | ICD-10-CM | POA: Diagnosis not present

## 2024-03-30 DIAGNOSIS — N951 Menopausal and female climacteric states: Secondary | ICD-10-CM | POA: Diagnosis not present

## 2024-03-30 MED ORDER — CYANOCOBALAMIN 1000 MCG/ML IJ SOLN
1000.0000 ug | Freq: Once | INTRAMUSCULAR | Status: AC
  Administered 2024-03-30: 1000 ug via INTRAMUSCULAR

## 2024-03-30 NOTE — Progress Notes (Signed)
 Pt presented for their vitamin B12 injection. Pt was identified through two identifiers. Pt tolerated shot well in their right deltoid.

## 2024-04-03 ENCOUNTER — Encounter: Payer: Self-pay | Admitting: Internal Medicine

## 2024-04-03 DIAGNOSIS — R238 Other skin changes: Secondary | ICD-10-CM | POA: Insufficient documentation

## 2024-04-03 NOTE — Assessment & Plan Note (Signed)
On prevacid.  No upper symptoms reported.  

## 2024-04-03 NOTE — Assessment & Plan Note (Signed)
 Continue metoprolol . Blood pressure as outlined. Follow pressures. No change in medication today.

## 2024-04-03 NOTE — Assessment & Plan Note (Signed)
 Increased stress. Continue citalopram . Also has buspar . Follow.

## 2024-04-03 NOTE — Assessment & Plan Note (Signed)
Has seen hematology previously.  Follow cbc and iron studies.   

## 2024-04-03 NOTE — Assessment & Plan Note (Signed)
 The 10-year ASCVD risk score (Arnett DK, et al., 2019) is: 1.2%   Values used to calculate the score:     Age: 53 years     Clinically relevant sex: Female     Is Non-Hispanic African American: No     Diabetic: No     Tobacco smoker: No     Systolic Blood Pressure: 125 mmHg     Is BP treated: Yes     HDL Cholesterol: 84.4 mg/dL     Total Cholesterol: 177 mg/dL  Continue diet and exercise. Follow lipid panel. Continues on crestor  - elevated calcium  score.

## 2024-04-03 NOTE — Assessment & Plan Note (Signed)
 Increased skin sensitivity. Discussed. Increased pain and irritation with light rubbing, movement etc. For example, loose clothes rubbing against the skin. Discussed treatment and possible etiologies. Unclear to exact cause. Already on gabapentin . Discussed possible increased dose. Follow.  Call with update.

## 2024-04-03 NOTE — Assessment & Plan Note (Signed)
 Increased pain as outlined. Noticed increase recently with lifting. Already on mobic. Using biofreeze. Trial of tizanidine . Follow. Call with update.

## 2024-04-06 ENCOUNTER — Ambulatory Visit

## 2024-04-12 ENCOUNTER — Ambulatory Visit

## 2024-05-23 NOTE — Progress Notes (Signed)
 Brianna [name]                                          MRN: [id number]   05/23/2024   The [facility] Quality Team Specialist reviewed this patient medical record for the purposes of chart review for care gap closure. The following were reviewed: abstraction for care gap closure-controlling blood pressure for 2025.    [facility] Quality Team

## 2024-06-30 ENCOUNTER — Other Ambulatory Visit

## 2024-07-04 ENCOUNTER — Encounter: Admitting: Internal Medicine
